# Patient Record
Sex: Male | Born: 1965
Health system: Southern US, Community
[De-identification: ages and names within clinical notes are randomized; demographics above are authoritative.]

## PROBLEM LIST (undated history)

## (undated) DIAGNOSIS — I10 Essential (primary) hypertension: Secondary | ICD-10-CM

## (undated) DIAGNOSIS — N183 Chronic kidney disease, stage 3 (moderate): Secondary | ICD-10-CM

## (undated) DIAGNOSIS — E118 Type 2 diabetes mellitus with unspecified complications: Secondary | ICD-10-CM

## (undated) DIAGNOSIS — H35032 Hypertensive retinopathy, left eye: Secondary | ICD-10-CM

## (undated) DIAGNOSIS — E113593 Type 2 diabetes mellitus with proliferative diabetic retinopathy without macular edema, bilateral: Secondary | ICD-10-CM

## (undated) DIAGNOSIS — E1140 Type 2 diabetes mellitus with diabetic neuropathy, unspecified: Secondary | ICD-10-CM

## (undated) DIAGNOSIS — Z8673 Personal history of transient ischemic attack (TIA), and cerebral infarction without residual deficits: Secondary | ICD-10-CM

## (undated) DIAGNOSIS — E785 Hyperlipidemia, unspecified: Secondary | ICD-10-CM

## (undated) HISTORY — DX: Hypertensive retinopathy, left eye: H35.032

## (undated) HISTORY — DX: Type 2 diabetes mellitus with proliferative diabetic retinopathy without macular edema, bilateral: E11.3593

## (undated) HISTORY — PX: FOOT SURGERY: SHX648

## (undated) HISTORY — DX: Chronic kidney disease, stage 3 (moderate): N18.3

## (undated) HISTORY — DX: Type 2 diabetes mellitus with diabetic neuropathy, unspecified: E11.40

## (undated) HISTORY — DX: Essential (primary) hypertension: I10

## (undated) HISTORY — PX: EYE SURGERY: SHX253

## (undated) HISTORY — DX: Personal history of transient ischemic attack (TIA), and cerebral infarction without residual deficits: Z86.73

## (undated) HISTORY — DX: Type 2 diabetes mellitus with unspecified complications: E11.8

## (undated) HISTORY — DX: Hyperlipidemia, unspecified: E78.5

---

## 2015-08-29 ENCOUNTER — Ambulatory Visit (INDEPENDENT_AMBULATORY_CARE_PROVIDER_SITE_OTHER): Payer: Self-pay | Admitting: Urgent Care

## 2015-08-29 VITALS — BP 170/110 | HR 85 | Temp 98.6°F | Resp 16 | Ht 65.0 in | Wt 186.4 lb

## 2015-08-29 DIAGNOSIS — I10 Essential (primary) hypertension: Secondary | ICD-10-CM

## 2015-08-29 DIAGNOSIS — R200 Anesthesia of skin: Secondary | ICD-10-CM

## 2015-08-29 DIAGNOSIS — H579 Unspecified disorder of eye and adnexa: Secondary | ICD-10-CM

## 2015-08-29 DIAGNOSIS — E1142 Type 2 diabetes mellitus with diabetic polyneuropathy: Secondary | ICD-10-CM

## 2015-08-29 DIAGNOSIS — E1165 Type 2 diabetes mellitus with hyperglycemia: Secondary | ICD-10-CM

## 2015-08-29 DIAGNOSIS — R202 Paresthesia of skin: Secondary | ICD-10-CM

## 2015-08-29 DIAGNOSIS — R208 Other disturbances of skin sensation: Secondary | ICD-10-CM

## 2015-08-29 DIAGNOSIS — IMO0001 Reserved for inherently not codable concepts without codable children: Secondary | ICD-10-CM

## 2015-08-29 LAB — POCT CBC
Granulocyte percent: 59.1 %G (ref 37–80)
HCT, POC: 39.7 % — AB (ref 43.5–53.7)
Hemoglobin: 14.5 g/dL (ref 14.1–18.1)
Lymph, poc: 2.1 (ref 0.6–3.4)
MCH, POC: 30 pg (ref 27–31.2)
MCHC: 36.5 g/dL — AB (ref 31.8–35.4)
MCV: 82.2 fL (ref 80–97)
MID (cbc): 0.7 (ref 0–0.9)
MPV: 7.2 fL (ref 0–99.8)
POC Granulocyte: 4 (ref 2–6.9)
POC LYMPH PERCENT: 30.9 %L (ref 10–50)
POC MID %: 10 %M (ref 0–12)
Platelet Count, POC: 234 10*3/uL (ref 142–424)
RBC: 4.82 M/uL (ref 4.69–6.13)
RDW, POC: 13 %
WBC: 6.7 10*3/uL (ref 4.6–10.2)

## 2015-08-29 LAB — POC MICROSCOPIC URINALYSIS (UMFC): Mucus: ABSENT

## 2015-08-29 LAB — POCT URINALYSIS DIP (MANUAL ENTRY)
BILIRUBIN UA: NEGATIVE
BILIRUBIN UA: NEGATIVE
LEUKOCYTES UA: NEGATIVE
Nitrite, UA: NEGATIVE
Spec Grav, UA: 1.025
Urobilinogen, UA: 0.2
pH, UA: 5

## 2015-08-29 LAB — COMPLETE METABOLIC PANEL WITH GFR
ALBUMIN: 4.1 g/dL (ref 3.6–5.1)
ALK PHOS: 79 U/L (ref 40–115)
ALT: 17 U/L (ref 9–46)
AST: 14 U/L (ref 10–40)
BILIRUBIN TOTAL: 0.9 mg/dL (ref 0.2–1.2)
BUN: 17 mg/dL (ref 7–25)
CALCIUM: 9.4 mg/dL (ref 8.6–10.3)
CO2: 27 mmol/L (ref 20–31)
Chloride: 99 mmol/L (ref 98–110)
Creat: 1.27 mg/dL (ref 0.60–1.35)
GFR, Est African American: 76 mL/min (ref >=60)
GFR, Est Non African American: 66 mL/min (ref >=60)
Glucose, Bld: 177 mg/dL — ABNORMAL HIGH (ref 65–99)
POTASSIUM: 3.5 mmol/L (ref 3.5–5.3)
SODIUM: 138 mmol/L (ref 135–146)
Total Protein: 7.3 g/dL (ref 6.1–8.1)

## 2015-08-29 LAB — GLUCOSE, POCT (MANUAL RESULT ENTRY): POC Glucose: 147 mg/dl — AB (ref 70–99)

## 2015-08-29 LAB — POCT GLYCOSYLATED HEMOGLOBIN (HGB A1C): HEMOGLOBIN A1C: 9.4

## 2015-08-29 MED ORDER — HYDROCHLOROTHIAZIDE 25 MG PO TABS: 25.0000 mg | ORAL_TABLET | Freq: Every day | ORAL | Status: DC

## 2015-08-29 NOTE — Progress Notes (Signed)
MRN: [id number] DOB: 19-Dec-1965  Subjective:   Max Sanchez is a 50 y.o. male presenting for chief complaint of Foot Pain  Reports that he was at the ophthalmologist today because he cannot see out of his left eye. At the ophthalmologist, he was told that he has a retinal bleed, he was scheduled for eye surgery 08/30/2015. His eye doctor also advised him to seek immediate care at Urgent Care or ED for diabetes. Patient states that he has previous diagnosis of HTN but does not take medications for this. Denies dizziness, headache, chest pain, shortness of breath, heart racing, palpitations, nausea, vomiting, abdominal pain, hematuria, lower leg swelling.    Max Sanchez has a current medication list which includes the following prescription(s): glipizide. Also has No Known Allergies.  Max Sanchez  has a past medical history of Diabetes mellitus without complication ([place]). Also  has past surgical history that includes Foot surgery.  Objective:   Vitals: BP 170/110 mmHg  Pulse 85  Temp(Src) 98.6 F (37 C) (Oral)  Resp 16  Ht 5\' 5"  (1.651 m)  Wt 186 lb 6.4 oz (84.55 kg)  BMI 31.02 kg/m2  SpO2 100%  BP Readings from Last 3 Encounters:  08/29/15 170/110   Physical Exam  Constitutional: He is oriented to person, place, and time. He appears well-developed and well-nourished.  HENT:  Mouth/Throat: Oropharynx is clear and moist.  Eyes: No scleral icterus.  Cardiovascular: Normal rate, regular rhythm and intact distal pulses.  Exam reveals no gallop and no friction rub.   No murmur heard. Pulmonary/Chest: No respiratory distress. He has no wheezes. He has no rales.  Abdominal: Soft. Bowel sounds are normal. He exhibits no distension and no mass. There is no tenderness.  Musculoskeletal: He exhibits no edema.  Neurological: He is alert and oriented to person, place, and time.  Skin: Skin is warm and dry.   Results for orders placed or performed in visit on 08/29/15 (from the past 24  hour(s))  POCT CBC     Status: Abnormal   Collection Time: 08/29/15  4:52 PM  Result Value Ref Range   WBC 6.7 4.6 - 10.2 K/uL   Lymph, poc 2.1 0.6 - 3.4   POC LYMPH PERCENT 30.9 10 - 50 %L   MID (cbc) 0.7 0 - 0.9   POC MID % 10.0 0 - 12 %M   POC Granulocyte 4.0 2 - 6.9   Granulocyte percent 59.1 37 - 80 %G   RBC 4.82 4.69 - 6.13 M/uL   Hemoglobin 14.5 14.1 - 18.1 g/dL   HCT, POC 39.7 (A) 43.5 - 53.7 %   MCV 82.2 80 - 97 fL   MCH, POC 30.0 27 - 31.2 pg   MCHC 36.5 (A) 31.8 - 35.4 g/dL   RDW, POC 13.0 %   Platelet Count, POC 234 142 - 424 K/uL   MPV 7.2 0 - 99.8 fL  POCT urinalysis dipstick     Status: Abnormal   Collection Time: 08/29/15  4:52 PM  Result Value Ref Range   Color, UA yellow yellow   Clarity, UA clear clear   Glucose, UA =500 (A) negative   Bilirubin, UA negative negative   Ketones, POC UA negative negative   Spec Grav, UA 1.025    Blood, UA moderate (A) negative   pH, UA 5.0    Protein Ur, POC >=300 (A) negative   Urobilinogen, UA 0.2    Nitrite, UA Negative Negative   Leukocytes, UA Negative Negative  POCT Microscopic Urinalysis ([facility])     Status: Abnormal   Collection Time: 08/29/15  4:52 PM  Result Value Ref Range   WBC,UR,HPF,POC None None WBC/hpf   RBC,UR,HPF,POC Few (A) None RBC/hpf   Bacteria None None, Too numerous to count   Mucus Absent Absent   Epithelial Cells, UR Per Microscopy Few (A) None, Too numerous to count cells/hpf  POCT glycosylated hemoglobin (Hb A1C)     Status: None   Collection Time: 08/29/15  4:52 PM  Result Value Ref Range   Hemoglobin A1C 9.4   POCT glucose (manual entry)     Status: Abnormal   Collection Time: 08/29/15  4:52 PM  Result Value Ref Range   POC Glucose 147 (A) 70 - 99 mg/dl   Assessment and Plan :   1. Uncontrolled type 2 diabetes mellitus without complication, without long-term current use of insulin ([place]) 2. Diabetic polyneuropathy associated with type 2 diabetes mellitus ([place]) 3. Numbness and  tingling 4. Burning sensation of feet - Labs pending. Start glipizide. Labs pending, will add Metformin to his regimen but would like to see liver enzymes first. Counseled on dietary modifications.   5. Essential hypertension - Start HCTZ, counseled on signs and symptoms of end organ damage. His retinal bleeding may very well be due to HTN or uncontrolled diabetes. Counseled patient on better management of these diagnoses.   6. Eye problem - Keep appointment for eye surgery tomorrow.  Jaynee Eagles, PA-C [facility] [phone] 08/29/2015 4:23 PM

## 2015-08-29 NOTE — Patient Instructions (Addendum)
La diabetes mellitus y los alimentos (Diabetes Mellitus and Food) Es importante que controle su nivel de azcar en la sangre (glucosa). El nivel de glucosa en sangre depende en gran medida de lo que usted come. Comer alimentos saludables en las cantidades [place] a lo largo del Training and development officer, aproximadamente a la misma hora [employer], lo ayudar a Chief Technology Officer su nivel de Multimedia programmer. Tambin puede ayudarlo a retrasar o Patent attorney de la diabetes mellitus. Comer de [employer] saludable incluso puede ayudarlo a Chartered loss adjuster de presin arterial y a Science writer o Theatre manager un peso saludable.  Entre las recomendaciones generales para alimentarse y Audiological scientist los alimentos de forma saludable, se incluyen las siguientes:  Respetar las comidas principales y comer colaciones con regularidad. Evitar pasar largos perodos sin comer con el fin de perder peso.  Seguir una dieta que consista principalmente en alimentos de origen vegetal, como frutas, vegetales, frutos secos, legumbres y cereales integrales.  Utilizar mtodos de coccin a baja temperatura, como hornear, en lugar de mtodos de coccin a alta temperatura, como frer en abundante aceite. Trabaje con el nutricionista para aprender a Financial planner nutricional de las etiquetas de los alimentos. CMO PUEDEN AFECTARME LOS ALIMENTOS? Carbohidratos Los carbohidratos afectan el nivel de glucosa en sangre ms que cualquier otro tipo de alimento. El nutricionista lo ayudar a Teacher, adult education cuntos carbohidratos puede consumir en cada comida y ensearle a contarlos. El recuento de carbohidratos es importante para mantener la glucosa en sangre en un nivel saludable, en especial si utiliza insulina o toma determinados medicamentos para la diabetes mellitus. Alcohol El alcohol puede provocar disminuciones sbitas de la glucosa en sangre (hipoglucemia), en especial si utiliza insulina o toma determinados medicamentos para la diabetes mellitus. La  hipoglucemia es una afeccin que puede poner en peligro la vida. Los sntomas de la hipoglucemia (somnolencia, mareos y Data processing manager) son similares a los sntomas de haber consumido mucho alcohol.  Si el mdico lo autoriza a beber alcohol, hgalo con moderacin y siga estas pautas:  Las mujeres no deben beber ms de un trago por da, y los hombres no deben beber ms de dos tragos por Training and development officer. Un trago es igual a:  12 onzas (355 ml) de cerveza  5 onzas de vino (150 ml) de vino  1,5onzas (23m) de bebidas espirituosas  No beba con el estmago vaco.  Mantngase hidratado. Beba agua, gaseosas dietticas o t helado sin azcar.  Las gaseosas comunes, los jugos y otros refrescos podran contener muchos carbohidratos y se dCivil Service fast streamer QU ALIMENTOS NO SE RECOMIENDAN? Cuando haga las elecciones de alimentos, es importante que recuerde que todos los alimentos son distintos. Algunos tienen menos nutrientes que otros por porcin, aunque podran tener la misma cantidad de caloras o carbohidratos. Es difcil darle al cuerpo lo que necesita cuando consume alimentos con menos nutrientes. Estos son algunos ejemplos de alimentos que debera evitar ya que contienen muchas caloras y carbohidratos, pero pocos nutrientes:  GPhysicist, medicaltrans (la mayora de los alimentos procesados incluyen grasas trans en la etiqueta de Informacin nutricional).  Gaseosas comunes.  Jugos.  Caramelos.  Dulces, como tortas, pasteles, rosquillas y g[place]  Comidas fritas. QU ALIMENTOS PUEDO COMER? Consuma alimentos ricos en nutrientes, que nutrirn el cuerpo y lo mantendrn saludable. Los alimentos que debe comer tambin dependern de varios factores, como:  Las caloras que necesita.  Los medicamentos que toma.  Su peso.  El nivel de glucosa en sMarist College  El nArrow Rockde presin arterial.  El nivel de colesterol.  Debe consumir una amplia variedad de alimentos, por ejemplo:  Protenas.  Cortes de [employer].  Protenas con bajo contenido de grasas saturadas, como pescado, clara de huevo y frijoles. Evite las carnes procesadas.  Frutas y vegetales.  Frutas y Photographer que pueden ayudar a Chief Technology Officer los niveles sanguneos de [place], como [place], mangos y batatas.  Productos lcteos.  Elija productos lcteos sin grasa o con bajo contenido de [place], como [place], yogur y [place].  Cereales, panes, pastas y arroz.  Elija cereales integrales, como panes multicereales, avena en grano y arroz integral. Estos alimentos pueden ayudar a controlar la presin arterial.  Daphene Jaeger.  Alimentos que contengan grasas saludables, como frutos secos, Musician, aceite de [place], aceite de canola y pescado. TODOS LOS QUE PADECEN DIABETES MELLITUS TIENEN EL [place] PLAN DE [place]? Dado que todas las personas que padecen diabetes mellitus son distintas, no hay un solo plan de comidas que funcione para todos. Es muy importante que se rena con un nutricionista que lo ayudar a crear un plan de comidas adecuado para usted.   Esta informacin no tiene Marine scientist el consejo del mdico. Asegrese de hacerle al mdico cualquier pregunta que tenga.   Document Released: 05/27/2007 Document Revised: 03/10/2014 Elsevier Interactive Patient Education 2016 [employer].    Hipertensin (Hypertension) La hipertensin, conocida comnmente como presin arterial alta, se produce cuando la sangre bombea en las arterias con mucha fuerza. Las arterias son los vasos sanguneos que transportan la sangre desde el corazn hacia todas las partes del cuerpo. Una lectura de la presin arterial consiste en un nmero ms alto sobre un nmero ms bajo, por ejemplo, 110/72. El nmero ms alto (presin sistlica) corresponde a la presin interna de las arterias cuando el corazn [place]. El nmero ms bajo (presin diastlica) corresponde a la presin interna de las arterias cuando el corazn se relaja. En condiciones ideales,  la presin arterial debe ser inferior a 120/80. La hipertensin fuerza al corazn a trabajar ms para Herbalist. Las arterias pueden estrecharse o ponerse rgidas. La hipertensin no tratada o no controlada puede causar infarto de miocardio, ictus, enfermedad renal y otros problemas. [place] de riesgo de hipertensin son controlables, pero otros no lo son.  Entre los factores de riesgo que usted no puede Chief Technology Officer, se [employer] siguientes:   La raza. El riesgo es mayor para las Retail banker.  La edad. Los riesgos aumentan con la edad.  El sexo. Antes de los 45aos, los hombres corren ms [employer]. Despus de los 65aos, las mujeres corren ms [employer]. Entre los factores de riesgo que usted puede Chief Technology Officer, se [employer] siguientes:  No hacer la cantidad suficiente de actividad fsica o ejercicio.  Tener sobrepeso.  Consumir mucha grasa, azcar, caloras o sal en la dieta.  Beber alcohol en exceso. SIGNOS Y SNTOMAS Por lo general, la hipertensin no causa signos o sntomas. La hipertensin arterial demasiado alta (crisis hipertensiva) puede causar dolor de cabeza, ansiedad, falta de aire y hemorragia nasal. DIAGNSTICO Para detectar si usted tiene hipertensin, el mdico le medir la presin arterial mientras est sentado, con el brazo levantado a la altura del corazn. Debe medirla al [facility] veces en el mismo brazo. Determinadas condiciones pueden causar una diferencia de presin arterial entre el brazo izquierdo y Insurance underwriter. El hecho de tener una sola lectura de la presin arterial ms alta que lo normal no significa que Stage manager. Si no  est claro si tiene hipertensin arterial, es posible que se le pida que regrese otro da para volver a controlarle la presin arterial. O bien se le puede pedir que se controle la presin arterial en su casa durante 1 o ms meses. [place] hipertensin arterial incluye hacer cambios en el estilo de vida y, posiblemente, tomar medicamentos. Un estilo de vida saludable puede ayudar a bajar la presin arterial alta. Quiz deba cambiar algunos hbitos. Los cambios en el estilo de vida pueden incluir lo siguiente:  Seguir la dieta DASH. Esta dieta tiene un alto contenido de frutas, verduras y Psychologist, prison and probation services. Incluye poca cantidad de sal, carnes rojas y azcares agregados.  Mantenga el consumo de sodio por debajo de 2300 mg por da.  Realizar al [employer] 30 y 73 minutos de ejercicio [place], 4 veces por semana como mnimo.  Perder peso, si es necesario.  No fumar.  Limitar el consumo de bebidas alcohlicas.  Aprender formas de reducir el estrs. El mdico puede recetarle medicamentos si los cambios en el estilo de vida no son suficientes para Child psychotherapist la presin arterial y si una de las siguientes afirmaciones es verdadera:  Fidel Levy 64 y 61 aos y su presin arterial sistlica est por encima de 140.  Tiene 60 aos o ms y su presin arterial sistlica est por encima de 150.  Su presin arterial diastlica est por encima de 90.  Tiene diabetes y su presin arterial sistlica est por encima de 140 o su presin arterial diastlica est por encima de 90.  Tiene una enfermedad renal y su presin arterial est por encima de 140/90.  Tiene una enfermedad cardaca y su presin arterial est por encima de 140/90. La presin arterial deseada puede variar en funcin de las enfermedades, la edad y otros factores personales. INSTRUCCIONES PARA EL CUIDADO EN EL HOGAR  Haga que le midan de nuevo la presin arterial segn las indicaciones del [place] los medicamentos solamente como se lo haya indicado el mdico. Siga cuidadosamente las indicaciones. Los medicamentos para la presin arterial deben tomarse segn las indicaciones. Los medicamentos pierden eficacia al omitir las dosis. El hecho  de omitir las dosis tambin [place] el riesgo de otros problemas.  No fume.  Contrlese la presin arterial en su casa segn las indicaciones del mdico. SOLICITE ATENCIN MDICA SI:   Piensa que tiene una reaccin alrgica a los medicamentos.  Tiene mareos o dolores de cabeza con Scientist, research (physical sciences).  Tiene hinchazn en los tobillos.  Tiene problemas de visin. SOLICITE ATENCIN MDICA DE INMEDIATO SI:  Siente un dolor de cabeza intenso o confusin.  Siente debilidad inusual, adormecimiento o que Geneticist, molecular.  Siente dolor intenso en el pecho o en el abdomen.  Vomita repetidas veces.  Tiene dificultad para respirar. ASEGRESE DE QUE:   Comprende estas instrucciones.  Controlar su afeccin.  Recibir ayuda de inmediato si no mejora o si empeora.   Esta informacin no tiene Marine scientist el consejo del mdico. Asegrese de hacerle al mdico cualquier pregunta que tenga.   Document Released: 02/17/2005 Document Revised: 07/04/2014 Elsevier Interactive Patient Education [employer].     IF you received an x-ray today, you will receive an invoice from [facility]. Please contact [facility] at [phone] with questions or concerns regarding your invoice.   IF you received labwork today, you will receive an invoice from [employer]. Please contact [facility] at [phone] with questions or concerns regarding your  invoice.   Our billing staff will not be able to assist you with questions regarding bills from these companies.  You will be contacted with the lab results as soon as they are available. The fastest way to get your results is to activate your My Chart account. Instructions are located on the last page of this paperwork. If you have not heard from [place] regarding the results in 2 weeks, please contact this office.

## 2015-09-07 ENCOUNTER — Other Ambulatory Visit: Payer: Self-pay | Admitting: Urgent Care

## 2015-09-07 DIAGNOSIS — I10 Essential (primary) hypertension: Secondary | ICD-10-CM

## 2015-09-07 DIAGNOSIS — IMO0001 Reserved for inherently not codable concepts without codable children: Secondary | ICD-10-CM

## 2015-09-07 DIAGNOSIS — E1165 Type 2 diabetes mellitus with hyperglycemia: Principal | ICD-10-CM

## 2015-09-07 MED ORDER — LISINOPRIL 10 MG PO TABS: 10.0000 mg | ORAL_TABLET | Freq: Every day | ORAL | Status: DC

## 2015-09-07 MED ORDER — METFORMIN HCL 1000 MG PO TABS: 1000.0000 mg | ORAL_TABLET | Freq: Two times a day (BID) | ORAL | Status: DC

## 2016-05-20 ENCOUNTER — Other Ambulatory Visit: Payer: Self-pay | Admitting: Urgent Care

## 2016-05-21 ENCOUNTER — Other Ambulatory Visit: Payer: Self-pay | Admitting: Urgent Care

## 2016-06-06 ENCOUNTER — Other Ambulatory Visit: Payer: Self-pay | Admitting: Urgent Care

## 2016-06-16 DIAGNOSIS — H35032 Hypertensive retinopathy, left eye: Secondary | ICD-10-CM | POA: Insufficient documentation

## 2016-06-16 DIAGNOSIS — E113593 Type 2 diabetes mellitus with proliferative diabetic retinopathy without macular edema, bilateral: Secondary | ICD-10-CM | POA: Insufficient documentation

## 2016-10-01 HISTORY — PX: OTHER SURGICAL HISTORY: SHX169

## 2016-10-02 DIAGNOSIS — H3342 Traction detachment of retina, left eye: Secondary | ICD-10-CM | POA: Insufficient documentation

## 2016-12-03 HISTORY — PX: OTHER SURGICAL HISTORY: SHX169

## 2016-12-04 DIAGNOSIS — H3341 Traction detachment of retina, right eye: Secondary | ICD-10-CM | POA: Insufficient documentation

## 2017-04-01 HISTORY — PX: OTHER SURGICAL HISTORY: SHX169

## 2018-03-03 DIAGNOSIS — I639 Cerebral infarction, unspecified: Secondary | ICD-10-CM

## 2018-03-03 HISTORY — DX: Cerebral infarction, unspecified: I63.9

## 2018-03-10 ENCOUNTER — Encounter (HOSPITAL_COMMUNITY): Payer: Self-pay

## 2018-03-10 ENCOUNTER — Other Ambulatory Visit (HOSPITAL_COMMUNITY): Payer: Self-pay

## 2018-03-10 ENCOUNTER — Inpatient Hospital Stay (HOSPITAL_COMMUNITY): Payer: Self-pay

## 2018-03-10 ENCOUNTER — Emergency Department (HOSPITAL_COMMUNITY): Payer: Self-pay

## 2018-03-10 ENCOUNTER — Encounter (HOSPITAL_COMMUNITY): Payer: Self-pay | Admitting: Emergency Medicine

## 2018-03-10 ENCOUNTER — Other Ambulatory Visit: Payer: Self-pay

## 2018-03-10 ENCOUNTER — Inpatient Hospital Stay (HOSPITAL_COMMUNITY)
Admission: EM | Admit: 2018-03-10 | Discharge: 2018-03-12 | DRG: 064 | Disposition: A | Discharge status: Home / Self Care | Payer: Self-pay | Attending: Internal Medicine | Admitting: Internal Medicine

## 2018-03-10 DIAGNOSIS — I10 Essential (primary) hypertension: Secondary | ICD-10-CM

## 2018-03-10 DIAGNOSIS — I129 Hypertensive chronic kidney disease with stage 1 through stage 4 chronic kidney disease, or unspecified chronic kidney disease: Secondary | ICD-10-CM | POA: Diagnosis present

## 2018-03-10 DIAGNOSIS — Z79899 Other long term (current) drug therapy: Secondary | ICD-10-CM

## 2018-03-10 DIAGNOSIS — G9341 Metabolic encephalopathy: Secondary | ICD-10-CM | POA: Diagnosis present

## 2018-03-10 DIAGNOSIS — E1165 Type 2 diabetes mellitus with hyperglycemia: Secondary | ICD-10-CM | POA: Diagnosis present

## 2018-03-10 DIAGNOSIS — Z8673 Personal history of transient ischemic attack (TIA), and cerebral infarction without residual deficits: Secondary | ICD-10-CM

## 2018-03-10 DIAGNOSIS — G8191 Hemiplegia, unspecified affecting right dominant side: Secondary | ICD-10-CM

## 2018-03-10 DIAGNOSIS — R809 Proteinuria, unspecified: Secondary | ICD-10-CM

## 2018-03-10 DIAGNOSIS — R410 Disorientation, unspecified: Secondary | ICD-10-CM

## 2018-03-10 DIAGNOSIS — Z9114 Patient's other noncompliance with medication regimen: Secondary | ICD-10-CM

## 2018-03-10 DIAGNOSIS — Z7902 Long term (current) use of antithrombotics/antiplatelets: Secondary | ICD-10-CM

## 2018-03-10 DIAGNOSIS — Z7982 Long term (current) use of aspirin: Secondary | ICD-10-CM

## 2018-03-10 DIAGNOSIS — I161 Hypertensive emergency: Secondary | ICD-10-CM | POA: Diagnosis present

## 2018-03-10 DIAGNOSIS — G8311 Monoplegia of lower limb affecting right dominant side: Secondary | ICD-10-CM | POA: Diagnosis present

## 2018-03-10 DIAGNOSIS — I639 Cerebral infarction, unspecified: Secondary | ICD-10-CM

## 2018-03-10 DIAGNOSIS — N179 Acute kidney failure, unspecified: Secondary | ICD-10-CM

## 2018-03-10 DIAGNOSIS — E1129 Type 2 diabetes mellitus with other diabetic kidney complication: Secondary | ICD-10-CM

## 2018-03-10 DIAGNOSIS — R600 Localized edema: Secondary | ICD-10-CM | POA: Diagnosis present

## 2018-03-10 DIAGNOSIS — E1122 Type 2 diabetes mellitus with diabetic chronic kidney disease: Secondary | ICD-10-CM | POA: Diagnosis present

## 2018-03-10 DIAGNOSIS — R4701 Aphasia: Secondary | ICD-10-CM | POA: Diagnosis present

## 2018-03-10 DIAGNOSIS — Z7984 Long term (current) use of oral hypoglycemic drugs: Secondary | ICD-10-CM

## 2018-03-10 DIAGNOSIS — Z87891 Personal history of nicotine dependence: Secondary | ICD-10-CM

## 2018-03-10 DIAGNOSIS — Z6829 Body mass index (BMI) 29.0-29.9, adult: Secondary | ICD-10-CM

## 2018-03-10 DIAGNOSIS — E669 Obesity, unspecified: Secondary | ICD-10-CM | POA: Diagnosis present

## 2018-03-10 DIAGNOSIS — N189 Chronic kidney disease, unspecified: Secondary | ICD-10-CM

## 2018-03-10 DIAGNOSIS — R9431 Abnormal electrocardiogram [ECG] [EKG]: Secondary | ICD-10-CM | POA: Diagnosis present

## 2018-03-10 DIAGNOSIS — E11319 Type 2 diabetes mellitus with unspecified diabetic retinopathy without macular edema: Secondary | ICD-10-CM | POA: Diagnosis present

## 2018-03-10 DIAGNOSIS — IMO0001 Reserved for inherently not codable concepts without codable children: Secondary | ICD-10-CM

## 2018-03-10 DIAGNOSIS — N183 Chronic kidney disease, stage 3 unspecified: Secondary | ICD-10-CM

## 2018-03-10 DIAGNOSIS — E1151 Type 2 diabetes mellitus with diabetic peripheral angiopathy without gangrene: Secondary | ICD-10-CM | POA: Diagnosis present

## 2018-03-10 DIAGNOSIS — N186 End stage renal disease: Secondary | ICD-10-CM

## 2018-03-10 DIAGNOSIS — R319 Hematuria, unspecified: Secondary | ICD-10-CM | POA: Diagnosis present

## 2018-03-10 DIAGNOSIS — E785 Hyperlipidemia, unspecified: Secondary | ICD-10-CM | POA: Diagnosis present

## 2018-03-10 DIAGNOSIS — I6389 Other cerebral infarction: Secondary | ICD-10-CM

## 2018-03-10 DIAGNOSIS — R4182 Altered mental status, unspecified: Secondary | ICD-10-CM

## 2018-03-10 DIAGNOSIS — I6310 Cerebral infarction due to embolism of unspecified precerebral artery: Principal | ICD-10-CM | POA: Diagnosis present

## 2018-03-10 DIAGNOSIS — Z9119 Patient's noncompliance with other medical treatment and regimen: Secondary | ICD-10-CM

## 2018-03-10 DIAGNOSIS — R29700 NIHSS score 0: Secondary | ICD-10-CM | POA: Diagnosis present

## 2018-03-10 HISTORY — DX: Personal history of transient ischemic attack (TIA), and cerebral infarction without residual deficits: Z86.73

## 2018-03-10 HISTORY — DX: End stage renal disease: N18.6

## 2018-03-10 HISTORY — DX: Essential (primary) hypertension: I10

## 2018-03-10 LAB — I-STAT CHEM 8, ED
BUN: 34 mg/dL — ABNORMAL HIGH (ref 6–20)
Calcium, Ion: 1.08 mmol/L — ABNORMAL LOW (ref 1.15–1.40)
Chloride: 103 mmol/L (ref 98–111)
Creatinine, Ser: 3.9 mg/dL — ABNORMAL HIGH (ref 0.61–1.24)
Glucose, Bld: 193 mg/dL — ABNORMAL HIGH (ref 70–99)
HEMATOCRIT: 33 % — AB (ref 39.0–52.0)
Hemoglobin: 11.2 g/dL — ABNORMAL LOW (ref 13.0–17.0)
Potassium: 3.5 mmol/L (ref 3.5–5.1)
SODIUM: 134 mmol/L — AB (ref 135–145)
TCO2: 22 mmol/L (ref 22–32)

## 2018-03-10 LAB — COMPREHENSIVE METABOLIC PANEL
ALT: 16 U/L (ref 0–44)
AST: 16 U/L (ref 15–41)
Albumin: 2.5 g/dL — ABNORMAL LOW (ref 3.5–5.0)
Alkaline Phosphatase: 69 U/L (ref 38–126)
Anion gap: 12 (ref 5–15)
BUN: 37 mg/dL — ABNORMAL HIGH (ref 6–20)
CO2: 19 mmol/L — ABNORMAL LOW (ref 22–32)
Calcium: 8.4 mg/dL — ABNORMAL LOW (ref 8.9–10.3)
Chloride: 102 mmol/L (ref 98–111)
Creatinine, Ser: 3.87 mg/dL — ABNORMAL HIGH (ref 0.61–1.24)
GFR calc Af Amer: 20 mL/min — ABNORMAL LOW (ref >60)
GFR, EST NON AFRICAN AMERICAN: 17 mL/min — AB (ref >60)
Glucose, Bld: 193 mg/dL — ABNORMAL HIGH (ref 70–99)
Potassium: 3.4 mmol/L — ABNORMAL LOW (ref 3.5–5.1)
Sodium: 133 mmol/L — ABNORMAL LOW (ref 135–145)
TOTAL PROTEIN: 6.3 g/dL — AB (ref 6.5–8.1)
Total Bilirubin: 0.9 mg/dL (ref 0.3–1.2)

## 2018-03-10 LAB — CBG MONITORING, ED
GLUCOSE-CAPILLARY: 165 mg/dL — AB (ref 70–99)
Glucose-Capillary: 188 mg/dL — ABNORMAL HIGH (ref 70–99)
Glucose-Capillary: 69 mg/dL — ABNORMAL LOW (ref 70–99)
Glucose-Capillary: 161 mg/dL — ABNORMAL HIGH (ref 70–99)

## 2018-03-10 LAB — URINALYSIS, ROUTINE W REFLEX MICROSCOPIC
Bilirubin Urine: NEGATIVE
Glucose, UA: >=500 mg/dL — AB
Ketones, ur: 5 mg/dL — AB
Leukocytes, UA: NEGATIVE
Nitrite: NEGATIVE
Specific Gravity, Urine: 1.014 (ref 1.005–1.030)
pH: 5 (ref 5.0–8.0)

## 2018-03-10 LAB — DIFFERENTIAL
Abs Immature Granulocytes: 0.03 10*3/uL (ref 0.00–0.07)
BASOS PCT: 1 %
Basophils Absolute: 0.1 10*3/uL (ref 0.0–0.1)
Eosinophils Absolute: 0.1 10*3/uL (ref 0.0–0.5)
Eosinophils Relative: 1 %
Immature Granulocytes: 0 %
Lymphocytes Relative: 13 %
Lymphs Abs: 1.3 10*3/uL (ref 0.7–4.0)
MONOS PCT: 5 %
Monocytes Absolute: 0.5 10*3/uL (ref 0.1–1.0)
NEUTROS PCT: 80 %
Neutro Abs: 8.2 10*3/uL — ABNORMAL HIGH (ref 1.7–7.7)

## 2018-03-10 LAB — RAPID URINE DRUG SCREEN, HOSP PERFORMED
Amphetamines: NOT DETECTED
Barbiturates: NOT DETECTED
Benzodiazepines: NOT DETECTED
COCAINE: NOT DETECTED
Opiates: NOT DETECTED
Tetrahydrocannabinol: NOT DETECTED

## 2018-03-10 LAB — CBC
HCT: 35.1 % — ABNORMAL LOW (ref 39.0–52.0)
Hemoglobin: 11.4 g/dL — ABNORMAL LOW (ref 13.0–17.0)
MCH: 26.8 pg (ref 26.0–34.0)
MCHC: 32.5 g/dL (ref 30.0–36.0)
MCV: 82.4 fL (ref 80.0–100.0)
Platelets: 265 10*3/uL (ref 150–400)
RBC: 4.26 MIL/uL (ref 4.22–5.81)
RDW: 12.8 % (ref 11.5–15.5)
WBC: 10.2 10*3/uL (ref 4.0–10.5)
nRBC: 0 % (ref 0.0–0.2)

## 2018-03-10 LAB — ECHOCARDIOGRAM COMPLETE

## 2018-03-10 LAB — ETHANOL: Alcohol, Ethyl (B): <10 mg/dL (ref <10)

## 2018-03-10 LAB — LIPID PANEL
Cholesterol: 359 mg/dL — ABNORMAL HIGH (ref 0–200)
HDL: 42 mg/dL (ref >40)
LDL Cholesterol: 255 mg/dL — ABNORMAL HIGH (ref 0–99)
Total CHOL/HDL Ratio: 8.5 RATIO
Triglycerides: 312 mg/dL — ABNORMAL HIGH (ref <150)
VLDL: 62 mg/dL — ABNORMAL HIGH (ref 0–40)

## 2018-03-10 LAB — CREATININE, URINE, RANDOM: CREATININE, URINE: 82.95 mg/dL

## 2018-03-10 LAB — APTT: aPTT: 27 seconds (ref 24–36)

## 2018-03-10 LAB — I-STAT TROPONIN, ED: Troponin i, poc: 0.01 ng/mL (ref 0.00–0.08)

## 2018-03-10 LAB — PROTIME-INR
INR: 1.02
Prothrombin Time: 13.3 seconds (ref 11.4–15.2)

## 2018-03-10 LAB — HEMOGLOBIN A1C
Hgb A1c MFr Bld: 7.3 % — ABNORMAL HIGH (ref 4.8–5.6)
Mean Plasma Glucose: 162.81 mg/dL

## 2018-03-10 LAB — GLUCOSE, CAPILLARY: Glucose-Capillary: 145 mg/dL — ABNORMAL HIGH (ref 70–99)

## 2018-03-10 LAB — SODIUM, URINE, RANDOM: Sodium, Ur: 64 mmol/L

## 2018-03-10 MED ORDER — HEPARIN SODIUM (PORCINE) 5000 UNIT/ML IJ SOLN
5000.0000 [IU] | Freq: Three times a day (TID) | INTRAMUSCULAR | Status: DC
  Administered 2018-03-10 – 2018-03-12 (×7): 5000 [IU] via SUBCUTANEOUS
  Filled 2018-03-10 (×7): qty 1

## 2018-03-10 MED ORDER — LABETALOL HCL 5 MG/ML IV SOLN
5.0000 mg | Freq: Once | INTRAVENOUS | Status: AC
  Administered 2018-03-10: 5 mg via INTRAVENOUS
  Filled 2018-03-10: qty 4

## 2018-03-10 MED ORDER — ASPIRIN EC 81 MG PO TBEC
81.0000 mg | DELAYED_RELEASE_TABLET | Freq: Every day | ORAL | Status: DC
  Administered 2018-03-11 – 2018-03-12 (×2): 81 mg via ORAL
  Filled 2018-03-10 (×2): qty 1

## 2018-03-10 MED ORDER — ACETAMINOPHEN 325 MG PO TABS
650.0000 mg | ORAL_TABLET | Freq: Four times a day (QID) | ORAL | Status: DC | PRN
  Administered 2018-03-10: 650 mg via ORAL
  Filled 2018-03-10: qty 2

## 2018-03-10 MED ORDER — ATORVASTATIN CALCIUM 40 MG PO TABS
40.0000 mg | ORAL_TABLET | Freq: Every day | ORAL | Status: DC
  Administered 2018-03-10 – 2018-03-11 (×2): 40 mg via ORAL
  Filled 2018-03-10 (×2): qty 1

## 2018-03-10 MED ORDER — ASPIRIN 325 MG PO TABS
325.0000 mg | ORAL_TABLET | Freq: Every day | ORAL | Status: AC
  Administered 2018-03-10: 325 mg via ORAL
  Filled 2018-03-10: qty 1

## 2018-03-10 MED ORDER — SENNOSIDES-DOCUSATE SODIUM 8.6-50 MG PO TABS: 1.0000 | ORAL_TABLET | Freq: Every evening | ORAL | Status: DC | PRN

## 2018-03-10 MED ORDER — HYDRALAZINE HCL 20 MG/ML IJ SOLN
5.0000 mg | Freq: Once | INTRAMUSCULAR | Status: AC
  Administered 2018-03-10: 5 mg via INTRAVENOUS
  Filled 2018-03-10: qty 1

## 2018-03-10 MED ORDER — ACETAMINOPHEN 650 MG RE SUPP: 650.0000 mg | Freq: Four times a day (QID) | RECTAL | Status: DC | PRN

## 2018-03-10 MED ORDER — ONDANSETRON HCL 4 MG/2ML IJ SOLN
4.0000 mg | INTRAMUSCULAR | Status: AC
  Administered 2018-03-10: 4 mg via INTRAVENOUS
  Filled 2018-03-10: qty 2

## 2018-03-10 MED ORDER — ASPIRIN 325 MG PO TABS: 325.0000 mg | ORAL_TABLET | Freq: Every day | ORAL | Status: DC

## 2018-03-10 MED ORDER — INSULIN ASPART 100 UNIT/ML ~~LOC~~ SOLN
0.0000 [IU] | Freq: Three times a day (TID) | SUBCUTANEOUS | Status: DC
  Administered 2018-03-10: 3 [IU] via SUBCUTANEOUS
  Administered 2018-03-11: 2 [IU] via SUBCUTANEOUS
  Administered 2018-03-11: 3 [IU] via SUBCUTANEOUS
  Administered 2018-03-12: 2 [IU] via SUBCUTANEOUS
  Administered 2018-03-12: 3 [IU] via SUBCUTANEOUS

## 2018-03-10 NOTE — ED Notes (Signed)
Report from [place], [place]. Into reassess patient, he was sleeping, Awoke him and he started vomiting. Neurology at the bedside, verbal order for zofran x 1.

## 2018-03-10 NOTE — ED Notes (Signed)
Patient transported to X-ray 

## 2018-03-10 NOTE — ED Notes (Signed)
Patient transported to MRI 

## 2018-03-10 NOTE — ED Notes (Signed)
Patient given apple Juice for a CBG of 69

## 2018-03-10 NOTE — ED Notes (Signed)
Report attempted, RN to call back for report. 

## 2018-03-10 NOTE — H&P (Addendum)
Date: 03/10/2018               Patient Name:  Max Sanchez MRN: [id number]  DOB: 20-Nov-1965 Age / Sex: 53 y.o., male   PCP: System, Pcp Not In         Medical Service: Internal Medicine Teaching Service         Attending Physician: Dr. Heber Kingston, Rachel Moulds, DO    First Contact: Dr. Koleen Distance Pager: [phone]  Second Contact: Dr. Frederico Hamman Pager: [phone]       After Hours (After 5p/  First Contact Pager: [phone]  weekends / holidays): Second Contact Pager: [phone]   Chief Complaint: confusion  History of Present Illness: Max Sanchez is a 53 year old male with past medical history of type 2 diabetes with diabetic retinopathy, CKD stage III, and hypertension  that is brought in by family to the ED due to acute change in mental status this morning. Patient was seen walking around disoriented and confused.  When patient was evaluated he was oriented 3. He told me that yesterday he developed right-sided upper and lower extremity numbness and weakness. He states this resolved but continues to have some right lower extremity weakness. He states that he remembers feeling confused and could not recall his family members names this morning  He has associated symptoms of nausea/vomiting x 1 episode in the ED.  He reports a history of diabetes and hypertension and has not been taking Medications for his medical conditions for the past 6 months. He states that he is taking pills that are sent from [place] for these conditions but cannot tell me what they are.  He reports occasional use of BC powder for headaches. He currently denies headache, shortness of breath, chest pain or leg swelling.   Meds: HCTZ 25mg  daily, Spironolactone 50mg  BID, lisinopril 20mg    No outpatient medications have been marked as taking for the 03/10/18 encounter [facility] Encounter).     Allergies: Allergies as of 03/10/2018  . (No Known Allergies)   Past Medical History:  Diagnosis Date  . Diabetes mellitus without  complication ([place])     Family History: Father: diabetes Brother: diabetes  Social History: tobacco use: denies current use. Has a 30-pack-year smoking history Alcohol use: Denies Illicit drug use: Denies  Review of Systems: A complete ROS was negative except as per HPI.   Physical Exam: Blood pressure 122/77, pulse (!) 104, temperature (!) 97.5 F (36.4 C), temperature source Oral, resp. rate 15, SpO2 99 %. Physical Exam  Constitutional: He is oriented to person, place, and time and well-developed, well-nourished, and in no distress.  HENT:  Head: Normocephalic and atraumatic.  Eyes: Pupils are equal, round, and reactive to light. Conjunctivae are normal.  Neck: Normal range of motion. Neck supple.  Cardiovascular: Normal heart sounds. Exam reveals no gallop and no friction rub.  No murmur heard. tachycardic  Pulmonary/Chest: Effort normal and breath sounds normal. No respiratory distress. He has no wheezes. He has no rales.  Abdominal: Soft. Bowel sounds are normal. He exhibits no distension. There is no abdominal tenderness. There is no rebound and no guarding.  Musculoskeletal:        General: No edema.  Neurological: He is alert and oriented to person, place, and time. No cranial nerve deficit. Coordination normal.  Finger to nose test normal 5/5 strength in upper extremities bilaterally 5/5 strength in left lower extremity 3/5 strength in right lower extremity  Skin: Skin is warm and dry.  Psychiatric:  Mood, memory, affect and judgment normal.    EKG: personally reviewed my interpretation is sinus rhythm without ischemic changes  CXR: personally reviewed my interpretation is no acute cardio/pulmonary process  Assessment & Plan by Problem: Active Problems:   Confusion and disorientation   Hypertension   CKD (chronic kidney disease), stage III (HCC)   Acute-on-chronic kidney injury (HCC)   Proteinuria   Hematuria   Prolonged Q-T interval on ECG  Confusion and  disorientation Symptoms have improved since this morning. He was not confused or disoriented in the ED.  Blood pressure was elevated to 221/113 on arrival .  He has not been taking anti-hypertensive medication for the past 6 months. ED was concerned for hypertensive emergency with elevated blood pressure, confusion and AKI. He received one dose of hydralazine by the ED provider and blood pressure is 122/77.   Patient reports stroke like symptoms the day prior to admission.  CT head without acute intracranial abnormalities.  Will obtain MRI for assessment of CVA. At this time will hold further antihypertensive medications until MRI complete due to concern for stroke. -f/u MRI brain -vitals every 2hrs -mNIHSS Q2hr for 12 hrs -holding antihypertensives, monitoring closely  Acute on chronic kidney disease CKD stage III Baseline creatinine is 2.0.  Patient presents with a creatinine of 3.9.  UA without signs of infection.  Patient is noted to have hematuria, unchanged from previous UA in 2017. Patient denies flank pain.  Will need outpatient hematuria workup. Will obtain urine studies and renal ultrasound.   -urine Na, urine creatinine -renal ultrasound  Hypertension Patient has not taken antihypertensives for 6 months.  Patient follows with [facility]Slatk[facility] of [facility].  Patient's blood pressure regimen includes hydrochlorothiazide 25 mg daily, spironolactone 50 mg twice a day and lisinopril 20 mg daily.  His last 3 office visit blood pressure readings were 164/91, 197/109 and 216/117.  He is currently normotensive with 1 dose of IV hydralazine 5 mg in the ED.  Patient's elevated blood pressure readings today prior to IV BP meds is similar to previous office readings.   -monitor  Type 2 diabetes mellitus Proteinuria   Patient not currently taking glucose lowering medications.  Last hemoglobin A1c was 9.86 in March 2019. Serum glucose 193.   -SSI -Hgb A1C  Diabetic retinopathy Follows  with ophthalmology  Dispo: Admit patient to Inpatient with expected length of stay greater than 2 midnights.  Signed: Valinda Party, DO 03/10/2018, 9:41 AM  Pager: [phone]

## 2018-03-10 NOTE — ED Notes (Signed)
IM paged for patient vomiting and BP notification

## 2018-03-10 NOTE — ED Provider Notes (Addendum)
Medical screening examination/treatment/procedure(s) were conducted as a shared visit with non-physician practitioner(s) and myself.  I personally evaluated the patient during the encounter.  EKG Interpretation  Date/Time:  Wednesday March 10 2018 05:53:43 EST Ventricular Rate:  98 PR Interval:  120 QRS Duration: 90 QT Interval:  394 QTC Calculation: 503 R Axis:   71 Text Interpretation:  Normal sinus rhythm Nonspecific ST abnormality Prolonged QT Abnormal ECG No old tracing to compare Confirmed by Pryor Curia [phone]) on 03/10/2018 6:12:53 AM Also confirmed by Ward, Cyril Mourning [phone]), editor Philomena Doheny [phone])  on 03/10/2018 7:04:48 AM    Patient is a 53 year old male with unknown past medical history who presents to the emergency department with confusion.  Spanish interpreter used for patient's visit.  He presents with family.  They report he went to bed at 11 PM last night was acting normally.  They woke up this morning and he was wandering around the house acting confused.  Currently patient is oriented x3.  He has 5/5 strength in all 4 extremities.  Sensation to light touch intact diffusely.  Reports chronic numbness in bilateral feet.  No vision changes.  No visual field deficits.  No pronator drift.  Cranial nerves II through XII intact.  Normal speech.  Complaining of mild headache, mild dizziness, "heartburn".  Work-up initiated including labs, urine, head CT.  Does not meet criteria for code stroke.  CT head shows no acute abnormality.  He is hypertensive here.  Labs show acute renal failure.  Uremia may be the cause of his encephalopathy.  Renal failure may be secondary to hypertension.  Will admit for hypertensive emergency, acute renal failure, confusion.      Ward, Delice Bison, DO 03/10/18 [phone]

## 2018-03-10 NOTE — ED Notes (Signed)
Pt returned from MRI °

## 2018-03-10 NOTE — ED Provider Notes (Addendum)
[facility] EMERGENCY DEPARTMENT Provider Note   CSN: [id number] Arrival date & time: 03/10/18  0548     History   Chief Complaint Chief Complaint  Patient presents with  . Altered Mental Status    HPI Max Sanchez is a 53 y.o. male.  Max Sanchez is a 53 y.o. male diabetes and hypertension, who presents to the emergency department for evaluation accompanied by family after he woke up confused and disoriented.  Last known well time of 11 PM when he went to bed.  He woke this morning family found him wandering the house seemingly confused.  Patient is Spanish-speaking, wife and both children are at the bedside, and children speak [place] fluently.  Patient complaining of a mild headache some mild dizziness and "heartburn".  Patient denies any chest pain or shortness of breath.  Denies any vision changes.  Reports chronic numbness in bilateral feet no other focal neurologic symptoms.  Patient's children report he has a history of high blood pressure and his doctor at [facility] wanted to put him on multiple blood pressure medications but they report these made him feel "down and tired" so he stopped taking them and began taking the medications that he was given in [place], they were unsure what these medications are or what they are for.  Per chart review patient is followed at [facility] most recent evaluation appears to be in April 2019.  Baseline creatinine appears to be between 1.6 and 2.     Past Medical History:  Diagnosis Date  . Diabetes mellitus without complication ([place])     There are no active problems to display for this patient.   Past Surgical History:  Procedure Laterality Date  . FOOT SURGERY          Home Medications    Prior to Admission medications   Medication Sig Start Date End Date Taking? Authorizing Provider  hydrochlorothiazide (HYDRODIURIL) 25 MG tablet TAKE ONE TABLET BY MOUTH ONCE DAILY Patient not taking: Reported on 03/10/2018 06/06/16    Jaynee Eagles, PA-C  lisinopril (PRINIVIL,ZESTRIL) 10 MG tablet Take 1 tablet (10 mg total) by mouth daily. Patient not taking: Reported on 03/10/2018 09/07/15   Jaynee Eagles, PA-C  metFORMIN (GLUCOPHAGE) 1000 MG tablet Take 1 tablet (1,000 mg total) by mouth 2 (two) times daily with a meal. Patient not taking: Reported on 03/10/2018 09/07/15   Jaynee Eagles, PA-C    Family History No family history on file.  Social History Social History   Tobacco Use  . Smoking status: Former Smoker  Substance Use Topics  . Alcohol use: Not on file  . Drug use: Not on file     Allergies   Patient has no known allergies.   Review of Systems Review of Systems  Constitutional: Negative for chills and fever.  HENT: Negative.   Eyes: Negative for visual disturbance.  Respiratory: Negative for cough and shortness of breath.   Cardiovascular: Negative for chest pain.  Gastrointestinal: Negative for abdominal pain, nausea and vomiting.  Musculoskeletal: Negative for arthralgias, myalgias and neck pain.  Skin: Negative for color change and rash.  Neurological: Positive for dizziness and headaches. Negative for seizures, facial asymmetry, weakness, light-headedness and numbness.  Psychiatric/Behavioral: Positive for confusion.  All other systems reviewed and are negative.    Physical Exam Updated Vital Signs BP (!) 226/110   Pulse 100   Temp (!) 97.5 F (36.4 C) (Oral)   Resp (!) 21   SpO2 100%   Physical Exam  Vitals signs and nursing note reviewed.  Constitutional:      General: He is not in acute distress.    Appearance: Normal appearance. He is well-developed. He is not toxic-appearing or diaphoretic.  HENT:     Head: Normocephalic and atraumatic.     Mouth/Throat:     Mouth: Mucous membranes are moist.     Pharynx: Oropharynx is clear.  Eyes:     General:        Right eye: No discharge.        Left eye: No discharge.     Extraocular Movements: Extraocular movements intact.      Conjunctiva/sclera: Conjunctivae normal.     Pupils: Pupils are equal, round, and reactive to light.  Neck:     Musculoskeletal: Neck supple.  Cardiovascular:     Rate and Rhythm: Normal rate and regular rhythm.     Pulses: Normal pulses.     Heart sounds: Normal heart sounds. No murmur. No friction rub. No gallop.   Pulmonary:     Effort: Pulmonary effort is normal. No respiratory distress.     Breath sounds: Normal breath sounds. No wheezing or rales.     Comments: Respirations equal and unlabored, patient able to speak in full sentences, lungs clear to auscultation bilaterally Abdominal:     General: Abdomen is flat. Bowel sounds are normal. There is no distension.     Palpations: Abdomen is soft. There is no mass.     Tenderness: There is no abdominal tenderness. There is no guarding.     Comments: Abdomen soft, nondistended, nontender to palpation in all quadrants without guarding or peritoneal signs  Musculoskeletal:        General: No deformity.     Right lower leg: No edema.     Left lower leg: No edema.  Skin:    General: Skin is warm and dry.     Capillary Refill: Capillary refill takes less than 2 seconds.  Neurological:     General: No focal deficit present.     Mental Status: He is alert and oriented to person, place, and time.     Coordination: Coordination normal.     Comments: Speech is clear, able to follow commands CN III-XII intact Normal strength in upper and lower extremities bilaterally including dorsiflexion and plantar flexion, strong and equal grip strength Sensation normal to light and sharp touch Moves extremities without ataxia, coordination intact Normal finger to nose and rapid alternating movements No pronator drift  Psychiatric:        Mood and Affect: Mood normal.        Behavior: Behavior normal.      ED Treatments / Results  Labs (all labs ordered are listed, but only abnormal results are displayed) Labs Reviewed  CBC - Abnormal;  Notable for the following components:      Result Value   Hemoglobin 11.4 (*)    HCT 35.1 (*)    All other components within normal limits  DIFFERENTIAL - Abnormal; Notable for the following components:   Neutro Abs 8.2 (*)    All other components within normal limits  COMPREHENSIVE METABOLIC PANEL - Abnormal; Notable for the following components:   Sodium 133 (*)    Potassium 3.4 (*)    CO2 19 (*)    Glucose, Bld 193 (*)    BUN 37 (*)    Creatinine, Ser 3.87 (*)    Calcium 8.4 (*)    Total Protein 6.3 (*)  Albumin 2.5 (*)    GFR calc non Af Amer 17 (*)    GFR calc Af Amer 20 (*)    All other components within normal limits  CBG MONITORING, ED - Abnormal; Notable for the following components:   Glucose-Capillary 188 (*)    All other components within normal limits  I-STAT CHEM 8, ED - Abnormal; Notable for the following components:   Sodium 134 (*)    BUN 34 (*)    Creatinine, Ser 3.90 (*)    Glucose, Bld 193 (*)    Calcium, Ion 1.08 (*)    Hemoglobin 11.2 (*)    HCT 33.0 (*)    All other components within normal limits  PROTIME-INR  APTT  ETHANOL  RAPID URINE DRUG SCREEN, HOSP PERFORMED  URINALYSIS, ROUTINE W REFLEX MICROSCOPIC  I-STAT TROPONIN, ED  I-STAT CHEM 8, ED  I-STAT TROPONIN, ED    EKG EKG Interpretation  Date/Time:  Wednesday March 10 2018 05:53:43 EST Ventricular Rate:  98 PR Interval:  120 QRS Duration: 90 QT Interval:  394 QTC Calculation: 503 R Axis:   71 Text Interpretation:  Normal sinus rhythm Nonspecific ST abnormality Prolonged QT Abnormal ECG No old tracing to compare Confirmed by Pryor Curia [phone]) on 03/10/2018 6:12:53 AM Also confirmed by Ward, Cyril Mourning [phone]), editor Philomena Doheny [phone])  on 03/10/2018 7:04:48 AM   Radiology Dg Chest 2 View  Result Date: 03/10/2018 CLINICAL DATA:  Altered mental status with cough and vomiting EXAM: CHEST - 2 VIEW COMPARISON:  None. FINDINGS: Lungs are clear. Heart is borderline enlarged with  pulmonary vascularity normal. No adenopathy. No bone lesions. IMPRESSION: Borderline cardiac enlargement.  No edema or consolidation. Electronically Signed   By: Lowella Grip III M.D.   On: 03/10/2018 07:03   Ct Head Wo Contrast  Result Date: 03/10/2018 CLINICAL DATA:  Initial evaluation for acute altered mental status, confusion. EXAM: CT HEAD WITHOUT CONTRAST TECHNIQUE: Contiguous axial images were obtained from the base of the skull through the vertex without intravenous contrast. COMPARISON:  None available. FINDINGS: Brain: Generalized age-related cerebral atrophy with mild chronic small vessel ischemic disease. No acute intracranial hemorrhage. No acute large vessel territory infarct. No mass lesion, midline shift or mass effect. No hydrocephalus. No extra-axial fluid collection. Vascular: No hyperdense vessel. Skull: Scalp soft tissues and calvarium within normal limits. Sinuses/Orbits: Globes and orbital soft tissues normal. Paranasal sinuses are largely clear. No mastoid effusion. Other: None. IMPRESSION: 1. No acute intracranial abnormality. 2. Generalized age-related cerebral atrophy with mild chronic small vessel ischemic disease. Electronically Signed   By: Jeannine Boga M.D.   On: 03/10/2018 06:29    Procedures .Critical Care Performed by: Jacqlyn Larsen, PA-C Authorized by: Jacqlyn Larsen, PA-C   Critical care provider statement:    Critical care time (minutes):  45   Critical care was necessary to treat or prevent imminent or life-threatening deterioration of the following conditions:  Circulatory failure and renal failure (Hypertensive emergency with acute renal failure)   Critical care was time spent personally by me on the following activities:  Discussions with consultants, evaluation of patient's response to treatment, examination of patient, ordering and performing treatments and interventions, ordering and review of laboratory studies, ordering and review of  radiographic studies, pulse oximetry, re-evaluation of patient's condition, obtaining history from patient or surrogate and review of old charts   I assumed direction of critical care for this patient from another provider in my specialty: no     (including  critical care time)  Medications Ordered in ED Medications  hydrALAZINE (APRESOLINE) injection 5 mg (5 mg Intravenous Given 03/10/18 0708)     Initial Impression / Assessment and Plan / ED Course  I have reviewed the triage vital signs and the nursing notes.  Pertinent labs & imaging results that were available during my care of the patient were reviewed by me and considered in my medical decision making (see chart for details).  53 year old Spanish-speaking male arrives for evaluation of altered mental status.  Last known well time 39 PM reported by family who found him this morning confused and wandering the house.  On arrival patient is alert and oriented x3 with no focal neurologic deficits, NIH stroke scale of 0.  Complaining of mild headache and some intermittent dizziness, denies chest pain or shortness of breath, no abdominal pain.  Afebrile, patient noted to be profoundly hypertensive with blood pressure 226/110 on arrival, vitals otherwise unremarkable.  Lungs clear, abdominal exam is benign, no other focal findings on exam.  CT head, basic labs, troponin, EKG, chest x-ray, urinalysis, UDS, ethanol and coags ordered from triage.  CT head shows no acute findings, EKG with some nonspecific ST changes but no acute ischemic changes.  Initial troponin negative.  No leukocytosis, hemoglobin of 11.4.  Mild hypokalemia and hyponatremia sodium of 133 and potassium of 3.4.  Glucose is somewhat elevated at 193, known history of diabetes.  Creatinine is significantly elevated from baseline at 3.87, most recent was 1.86 in 4/19 per chart review in care everywhere.  BUN slightly elevated at 37, patient could have some element of uremic encephalopathy.   No anion gap.  Coags unremarkable.  Ethanol negative.  Urinalysis and UDS pending.  Chest x-ray shows no evidence of edema or infiltrate, no other active cardiopulmonary disease.  Reason Hayden Pedro concerning for hypertensive emergency with acute kidney injury and transient altered mental status, again there are no focal neurologic deficits on exam here today and head CT without any acute findings.  5 mg of hydralazine given with improvement in blood pressure now 171/95.  Case discussed with Dr. Leonides Schanz who saw and evaluated the patient as well, patient will need to be admitted for hypertensive emergency with AKI.  Case discussed with internal medicine teaching service who will see and admit the patient.  Vitals:   03/10/18 0630 03/10/18 0700 03/10/18 0715 03/10/18 0730  BP: (!) 226/110 (!) 234/115 (!) 171/95 (!) 165/87  Pulse: 100 (!) 105 (!) 104 (!) 105  Resp: (!) 21 (!) 8 19 (!) 21  Temp:      TempSrc:      SpO2: 100% 99% 100% 100%     Final Clinical Impressions(s) / ED Diagnoses   Final diagnoses:  Altered mental status, unspecified altered mental status type  Acute renal failure, unspecified acute renal failure type [facility])  Hypertensive emergency    ED Discharge Orders    None       Jacqlyn Larsen, [place] 03/10/18 [place], [place], PA-C 03/10/18 [place], Delice Bison, DO 03/14/18 [phone]

## 2018-03-10 NOTE — Progress Notes (Signed)
Update:   MRI revealed multiple scattered punctate strokes over bilateral cerebral hemispheres. Ordered ASA 325 x1 as well as ASA 81 mg (to start tomorrow) and atorva 40 mg QD. Neurology consulted, awaiting recs.   Welford Roche, MD  Internal Medicine PGY-2  P [phone]

## 2018-03-10 NOTE — ED Triage Notes (Signed)
Per family pt woke confused, not oriented to time, situation.  Pt knows how old he is and who he is but.  Pt having trouble following directions.

## 2018-03-10 NOTE — Progress Notes (Signed)
  Echocardiogram 2D Echocardiogram has been performed.  Darlina Sicilian M 03/10/2018, 1:53 PM

## 2018-03-10 NOTE — Consult Note (Addendum)
Referring Physician: Dr Isac Sarna    Reason for Consult: Neurologic opinion requested for tiny scattered strokes seen on MRI  HPI: Max Sanchez is an 53 y.o. male who is Spanish speaking only. Only known medical history is DM2 and reports having chronic numbness in both feet, but no other known PMH. The dtr states that he was taking some medicines (thinks this was for HTN) when he lived in [place] ~5-40yrs ago, but did not take his meds reliably and often stated that they "didn't work". He has not had much medical care since coming to the [place]. Per daughter the pt had c/o right side numbness when he went to bed around 11pm last night, but she nor the pt are able to tell me when that started. This has now gone away today since arrival to the hospital. Today he woke up confused and having trouble following directions. He did c/o headache and mild dizziness. Pts dtr says she has never seen him this way. CTH neg. MRI showed multiple tiny subcortical strokes bilaterally. No LVO, outside of tx window for IV tPA. He has been very hypertensive here. Labs show CRT 3.9, unable to do CTA. No prev history of renal failure. Will ck CUS/MRA head and Echo.   Past Medical History Past Medical History:  Diagnosis Date  . Diabetes mellitus without complication [facility])     Surgical History Past Surgical History:  Procedure Laterality Date  . FOOT SURGERY      Family History  No family history on file.  Social History:   reports that he has quit smoking. He does not have any smokeless tobacco history on file. No history on file for alcohol and drug.  Allergies:  No Known Allergies  Home Medications:  (Not in a hospital admission)   Hospital Medications . [START ON 03/11/2018] aspirin EC  81 mg Oral Daily  . aspirin  325 mg Oral Daily  . atorvastatin  40 mg Oral q1800  . heparin  5,000 Units Subcutaneous Q8H  . insulin aspart  0-15 Units Subcutaneous TID WC    ROS: unable  Physical Examination:   Vitals:   03/10/18 0715 03/10/18 0730 03/10/18 0824 03/10/18 0928  BP: (!) 171/95 (!) 165/87 (!) 149/92 122/77  Pulse: (!) 104 (!) 105 (!) 110 (!) 104  Resp: 19 (!) 21 10 15   Temp:      TempSrc:      SpO2: 100% 100% 100% 99%    General - moderate distress; appears stated age Heart - Regular rate and rhythm - no murmer appreciated Lungs - Clear to auscultation  Abdomen - Soft - non tender Extremities - Distal pulses intact - no edema Skin - Warm and dry   Neurologic Examination:  Mental Status: Alert, orients slowly, needs to be given options to pick from, but gets correct. Speech is slow, but fluent per interpreter without evidence of aphasia. Able to follow 3 step commands with some difficulty and psychomotor slowing. Cranial Nerves: II: Discs not visualized; Visual fields grossly normal, pupils equal, round, reactive to light III,IV, VI: ptosis not present, extra-ocular motions intact bilaterally V,VII: smile symmetric, facial light touch sensation normal bilaterally VIII: hearing normal bilaterally IX,X: gag reflex present XI: bilateral shoulder shrug XII: midline tongue extension Motor: RUE - 5/5    LUE - 5/5   RLE - 5/5    LLE - 5/5 Tone and bulk:normal tone throughout; no atrophy noted Sensory: Light touch intact throughout, bilaterally Deep Tendon Reflexes: 2+ and symmetric throughout  Plantars: Right: downgoing   Left: downgoing Cerebellar: normal finger-to-nose and normal heel-to-shin test Gait: deferred at this time.   NIHSS 1a Level of Conscious:0 1b LOC Questions: 0 1c LOC Commands: 0 2 Best Gaze: 0 3 Visual: 0 4 Facial Palsy: 0 5a Motor Arm - left: 0 5b Motor Arm - Right: 0 6a Motor Leg - Left: 0 6b Motor Leg - Right: 0 7 Limb Ataxia: 0 8 Sensory: 0 9 Best Language: 0 10 Dysarthria:0 11 Extinct. and Inattention:0 TOTAL: 0   LABORATORY STUDIES:  Basic Metabolic Panel: Recent Labs  Lab 03/10/18 0618 03/10/18 0622  NA 133* 134*  K 3.4*  3.5  CL 102 103  CO2 19*  --   GLUCOSE 193* 193*  BUN 37* 34*  CREATININE 3.87* 3.90*  CALCIUM 8.4*  --     Liver Function Tests: Recent Labs  Lab 03/10/18 0618  AST 16  ALT 16  ALKPHOS 69  BILITOT 0.9  PROT 6.3*  ALBUMIN 2.5*   No results for input(s): LIPASE, AMYLASE in the last 168 hours. No results for input(s): AMMONIA in the last 168 hours.  CBC: Recent Labs  Lab 03/10/18 0618 03/10/18 0622  WBC 10.2  --   NEUTROABS 8.2*  --   HGB 11.4* 11.2*  HCT 35.1* 33.0*  MCV 82.4  --   PLT 265  --     Cardiac Enzymes: No results for input(s): CKTOTAL, CKMB, CKMBINDEX, TROPONINI in the last 168 hours.  BNP: Invalid input(s): POCBNP  CBG: Recent Labs  Lab 03/10/18 0626 03/10/18 1200  GLUCAP 188* 165*    Microbiology:   Coagulation Studies: Recent Labs    03/10/18 0618  LABPROT 13.3  INR 1.02    Urinalysis:  Recent Labs  Lab 03/10/18 0612  COLORURINE YELLOW  LABSPEC 1.014  PHURINE 5.0  GLUCOSEU >=500*  HGBUR SMALL*  BILIRUBINUR NEGATIVE  KETONESUR 5*  PROTEINUR >=300*  NITRITE NEGATIVE  LEUKOCYTESUR NEGATIVE    Lipid Panel:  No results found for: CHOL, TRIG, HDL, CHOLHDL, VLDL, LDLCALC  HgbA1C:  Lab Results  Component Value Date   HGBA1C 7.3 (H) 03/10/2018    Urine Drug Screen:      Component Value Date/Time   LABOPIA NONE DETECTED 03/10/2018 0612   COCAINSCRNUR NONE DETECTED 03/10/2018 0612   LABBENZ NONE DETECTED 03/10/2018 0612   AMPHETMU NONE DETECTED 03/10/2018 0612   THCU NONE DETECTED 03/10/2018 0612   LABBARB NONE DETECTED 03/10/2018 0612     Alcohol Level:  Recent Labs  Lab 03/10/18 0619  ETH <10    Miscellaneous labs:  EKG  EKG   IMAGING: Dg Chest 2 View  Result Date: 03/10/2018 CLINICAL DATA:  Altered mental status with cough and vomiting EXAM: CHEST - 2 VIEW COMPARISON:  None. FINDINGS: Lungs are clear. Heart is borderline enlarged with pulmonary vascularity normal. No adenopathy. No bone lesions.  IMPRESSION: Borderline cardiac enlargement.  No edema or consolidation. Electronically Signed   By: Lowella Grip III M.D.   On: 03/10/2018 07:03   Ct Head Wo Contrast  Result Date: 03/10/2018 CLINICAL DATA:  Initial evaluation for acute altered mental status, confusion. EXAM: CT HEAD WITHOUT CONTRAST TECHNIQUE: Contiguous axial images were obtained from the base of the skull through the vertex without intravenous contrast. COMPARISON:  None available. FINDINGS: Brain: Generalized age-related cerebral atrophy with mild chronic small vessel ischemic disease. No acute intracranial hemorrhage. No acute large vessel territory infarct. No mass lesion, midline shift or mass effect. No hydrocephalus. No  extra-axial fluid collection. Vascular: No hyperdense vessel. Skull: Scalp soft tissues and calvarium within normal limits. Sinuses/Orbits: Globes and orbital soft tissues normal. Paranasal sinuses are largely clear. No mastoid effusion. Other: None. IMPRESSION: 1. No acute intracranial abnormality. 2. Generalized age-related cerebral atrophy with mild chronic small vessel ischemic disease. Electronically Signed   By: Jeannine Boga M.D.   On: 03/10/2018 06:29   Mr Brain Wo Contrast  Result Date: 03/10/2018 CLINICAL DATA:  Acute presentation with confusion. Headache and dizziness. EXAM: MRI HEAD WITHOUT CONTRAST TECHNIQUE: Multiplanar, multiecho pulse sequences of the brain and surrounding structures were obtained without intravenous contrast. COMPARISON:  Head CT same day FINDINGS: Brain: There are scattered punctate foci of restricted diffusion scattered within both cerebral hemispheres consistent with micro embolic infarctions from the heart or ascending aorta. None of these are larger than 2 or 3 mm. There is a background pattern of chronic small vessel ischemic change of the pons and moderate chronic small-vessel ischemic changes of the hemispheric white matter. There is an old right inferior frontal  cortical and subcortical infarction. No mass lesion, hemorrhage, hydrocephalus or extra-axial collection. Vascular: Major vessels at the base of the brain show flow. 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 No large or confluent infarction. Background pattern of chronic small vessel ischemic change. 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 Electronically Signed   By: Nelson Chimes M.D.   On: 03/10/2018 12:35   US Renal  Result Date: 03/10/2018 CLINICAL DATA:  53 year old male with history of acute kidney injury. EXAM: RENAL / URINARY TRACT ULTRASOUND COMPLETE COMPARISON:  None. FINDINGS: Right Kidney: Renal measurements: 12.7 x 5.2 x 5.7 cm = volume: 194.1 mL . Echogenicity within normal limits. No mass or hydronephrosis visualized. Left Kidney: Renal measurements: 12.8 x 6.4 x 5.3 cm = volume: 227.4 mL. Echogenicity within normal limits. In the interpolar region of the left kidney there is a 1.9 x 1.3 x 1.7 cm anechoic lesion with increased through transmission, compatible with a simple cyst. No hydronephrosis visualized. Bladder: Appears normal for degree of bladder distention. IMPRESSION: 1. Other than a simple cyst in the left kidney, the sonographic appearance of the kidneys is normal. Specifically, no hydronephrosis. Electronically Signed   By: Vinnie Langton M.D.   On: 03/10/2018 11:44      Assessment: 53 y.o. male with little known medical history arrives to ER with confusion, HTN emergency and ARF. Since BP has been better controlled in ER, his confusion has already improved upon my exam.There are no focal deficits and no current sensory deficits, but pt c/o right side sensory change yesterday evening for few hours before going to bed. Unclear if it was still present  this am as he was too confused to communicate properly. Work up included MRI brain, where micro tiny DWI changes are seen cortically bilaterally. We were consulted for stroke wk up.  Stroke Risk Factors - obesity, prev stroke, uncontrolled HTN, poor medical care/non-compliant  # Encephalopathy- multifactoral d/t ARF, HTN emergency, strokes. Although the tiny strokes alone would not cause this degree of confusion.  # Acute Ischemic strokes- scattered tiny embolic looking in bilat. Unable to do CTA d/t ARF. MRA head/CUS instead. Echo pending. Labs pending. # ARF- CRT 3.9- depending on how sudden this happened, this can certainly cause confusion. # HTN emergency- normotensive goals <160 given degree  of encephalopathy and no role in permissive HTN given size/locatin of infarcts.   Plan:  HgbA1c, fasting lipid panel  MRA  of the brain without contrast  PT consult, OT consult, Speech consult  Echocardiogram  Carotid dopplers  ASA already started by primary team  Statin Therapy - LDL goal < 70, Lipitor 40mg  just ordered  Risk factor modification  Telemetry monitoring  Frequent neuro checks  Fall Precautions  DVT prophelaxis NPO until swallowing evaluation has been passed (aspirin suppository if needed) Updated pt and family at bedside.   Desiree Metzger-Cihelka, ARNP-C, ANVP-BC  Attending Neurologist's note to follow    NEUROHOSPITALIST ADDENDUM Performed a face to face diagnostic evaluation.   I have reviewed the contents of history and physical exam as documented by PA/ARNP/Resident and agree with above documentation.  I have discussed and formulated the above plan as documented. Edits to the note have been made as needed.  Impression: 53 year old presenting with confusion in the setting of accelerated hypertension.  Patient also has elevated creatinine supporting diagnosis of hypertensive emergency.  On assessment by internal medicine team patient noted to have mild leg  weakness.  Key exam findings: Patient is alert and oriented.  Daughter helped translate to [place].  5 out of 5 strength in all 4 extremities. MRI brain was ordered which showed punctate subcortical infarcts-likely due to an vessel ischemia in the setting of hypertensive crisis.  Low suspicion for embolic phenomena-would recommend completion of stroke work-up with vascular imaging (MRI of the head and carotid Dopplers of the neck ) and echocardiogram.  BP goal will be bringing it below 540 systolic, would avoid hypotension.       Karena Addison  MD [facility] [phone]   If 7pm to 7am, please call on call as listed on AMION.

## 2018-03-10 NOTE — ED Notes (Signed)
ED TO INPATIENT HANDOFF REPORT  Name/Age/Gender Max Sanchez 53 y.o. male  Code Status    Code Status Orders  (From admission, onward)         Start     Ordered   03/10/18 0900  Full code  Continuous     03/10/18 0859        Code Status History    This patient has a current code status but no historical code status.      Home/SNF/Other Home  Chief Complaint confused  Level of Care/Admitting Diagnosis ED Disposition    ED Disposition Condition [facility] Area: [place] [100100]  Level of Care: Progressive [102]  Diagnosis: Confusion and disorientation [0998338]  Admitting Physician: Bosie Helper  Attending Physician: Lucious Groves [[id number]]  Estimated length of stay: past midnight tomorrow  Certification:: I certify this patient will need inpatient services for at least 2 midnights  PT Class (Do Not Modify): Inpatient [101]  PT Acc Code (Do Not Modify): Private [1]       Medical History Past Medical History:  Diagnosis Date  . Diabetes mellitus without complication ([place])     Allergies No Known Allergies  IV Location/Drains/Wounds Patient Lines/Drains/Airways Status   Active Line/Drains/Airways    Name:   Placement date:   Placement time:   Site:   Days:   Peripheral IV 03/10/18 Right;Upper Arm   03/10/18    0638    Arm   less than 1   Peripheral IV 03/10/18 Left Wrist   03/10/18    0639    Wrist   less than 1          Labs/Imaging Results for orders placed or performed during the hospital encounter of 03/10/18 (from the past 48 hour(s))  Urine rapid drug screen (hosp performed)     Status: None   Collection Time: 03/10/18  6:12 AM  Result Value Ref Range   Opiates NONE DETECTED NONE DETECTED   Cocaine NONE DETECTED NONE DETECTED   Benzodiazepines NONE DETECTED NONE DETECTED   Amphetamines NONE DETECTED NONE DETECTED   Tetrahydrocannabinol NONE DETECTED NONE DETECTED   Barbiturates NONE DETECTED  NONE DETECTED    Comment: (NOTE) DRUG SCREEN FOR MEDICAL PURPOSES ONLY.  IF CONFIRMATION IS NEEDED FOR ANY PURPOSE, NOTIFY LAB WITHIN 5 DAYS. LOWEST DETECTABLE LIMITS FOR URINE DRUG SCREEN Drug Class                     Cutoff (ng/mL) Amphetamine and metabolites    1000 Barbiturate and metabolites    200 Benzodiazepine                 250 Tricyclics and metabolites     300 Opiates and metabolites        300 Cocaine and metabolites        300 THC                            50 Performed at [facility], [place] [address] [place], [place] [address]   Urinalysis, Routine w reflex microscopic     Status: Abnormal   Collection Time: 03/10/18  6:12 AM  Result Value Ref Range   Color, Urine YELLOW YELLOW   APPearance CLEAR CLEAR   Specific Gravity, Urine 1.014 1.005 - 1.030   pH 5.0 5.0 - 8.0   Glucose, UA >=500 (A) NEGATIVE mg/dL  Hgb urine dipstick SMALL (A) NEGATIVE   Bilirubin Urine NEGATIVE NEGATIVE   Ketones, ur 5 (A) NEGATIVE mg/dL   Protein, ur >=300 (A) NEGATIVE mg/dL   Nitrite NEGATIVE NEGATIVE   Leukocytes, UA NEGATIVE NEGATIVE   RBC / HPF 6-10 0 - 5 RBC/hpf   WBC, UA 0-5 0 - 5 WBC/hpf   Bacteria, UA RARE (A) NONE SEEN   Squamous Epithelial / LPF 0-5 0 - 5   Mucus PRESENT    Sperm, UA PRESENT     Comment: Performed at [facility], [place] [address] [place], [place] [address]  Protime-INR     Status: None   Collection Time: 03/10/18  6:18 AM  Result Value Ref Range   Prothrombin Time 13.3 11.4 - 15.2 seconds   INR 1.02     Comment: Performed at [facility] [address], [place], [place] [address]  APTT     Status: None   Collection Time: 03/10/18  6:18 AM  Result Value Ref Range   aPTT 27 24 - 36 seconds    Comment: Performed at [facility] [address], [place], [place] [address]  CBC     Status: Abnormal   Collection Time: 03/10/18  6:18 AM  Result Value Ref Range   WBC 10.2 4.0 - 10.5 K/uL   RBC 4.26 4.22 - 5.81 MIL/uL    Hemoglobin 11.4 (L) 13.0 - 17.0 g/dL   HCT 35.1 (L) 39.0 - 52.0 %   MCV 82.4 80.0 - 100.0 fL   MCH 26.8 26.0 - 34.0 pg   MCHC 32.5 30.0 - 36.0 g/dL   RDW 12.8 11.5 - 15.5 %   Platelets 265 150 - 400 K/uL   nRBC 0.0 0.0 - 0.2 %    Comment: Performed at [facility], [place] [address] [place], [place] [address]  Differential     Status: Abnormal   Collection Time: 03/10/18  6:18 AM  Result Value Ref Range   Neutrophils Relative % 80 %   Neutro Abs 8.2 (H) 1.7 - 7.7 K/uL   Lymphocytes Relative 13 %   Lymphs Abs 1.3 0.7 - 4.0 K/uL   Monocytes Relative 5 %   Monocytes Absolute 0.5 0.1 - 1.0 K/uL   Eosinophils Relative 1 %   Eosinophils Absolute 0.1 0.0 - 0.5 K/uL   Basophils Relative 1 %   Basophils Absolute 0.1 0.0 - 0.1 K/uL   Immature Granulocytes 0 %   Abs Immature Granulocytes 0.03 0.00 - 0.07 K/uL    Comment: Performed at [facility] [address] [place], [place] [address]  Comprehensive metabolic panel     Status: Abnormal   Collection Time: 03/10/18  6:18 AM  Result Value Ref Range   Sodium 133 (L) 135 - 145 mmol/L   Potassium 3.4 (L) 3.5 - 5.1 mmol/L   Chloride 102 98 - 111 mmol/L   CO2 19 (L) 22 - 32 mmol/L   Glucose, Bld 193 (H) 70 - 99 mg/dL   BUN 37 (H) 6 - 20 mg/dL   Creatinine, Ser 3.87 (H) 0.61 - 1.24 mg/dL   Calcium 8.4 (L) 8.9 - 10.3 mg/dL   Total Protein 6.3 (L) 6.5 - 8.1 g/dL   Albumin 2.5 (L) 3.5 - 5.0 g/dL   AST 16 15 - 41 U/L   ALT 16 0 - 44 U/L   Alkaline Phosphatase 69 38 - 126 U/L   Total Bilirubin 0.9 0.3 -  1.2 mg/dL   GFR calc non Af Amer 17 (L) >60 mL/min   GFR calc Af Amer 20 (L) >60 mL/min   Anion gap 12 5 - 15    Comment: Performed at [facility] [address], [place], [place] [address]  Hemoglobin A1c     Status: Abnormal   Collection Time: 03/10/18  6:18 AM  Result Value Ref Range   Hgb A1c MFr Bld 7.3 (H) 4.8 - 5.6 %    Comment: (NOTE) Pre diabetes:          5.7%-6.4% Diabetes:              >6.4% Glycemic  control for   <7.0% adults with diabetes    Mean Plasma Glucose 162.81 mg/dL    Comment: Performed at [facility] [address], [place], [place] [address]  Sodium, urine, random     Status: None   Collection Time: 03/10/18  6:18 AM  Result Value Ref Range   Sodium, Ur 64 mmol/L    Comment: Performed at [facility] [address] [place], [place] [address]  Creatinine, urine, random     Status: None   Collection Time: 03/10/18  6:18 AM  Result Value Ref Range   Creatinine, Urine 82.95 mg/dL    Comment: Performed at [facility] [address], [place], [place] [address]  Lipid panel     Status: Abnormal   Collection Time: 03/10/18  6:18 AM  Result Value Ref Range   Cholesterol 359 (H) 0 - 200 mg/dL   Triglycerides 312 (H) <150 mg/dL   HDL 42 >40 mg/dL   Total CHOL/HDL Ratio 8.5 RATIO   VLDL 62 (H) 0 - 40 mg/dL   LDL Cholesterol 255 (H) 0 - 99 mg/dL    Comment:        Total Cholesterol/HDL:CHD Risk Coronary Heart Disease Risk Table                     Men   Women  1/2 Average Risk   3.4   3.3  Average Risk       5.0   4.4  2 X Average Risk   9.6   7.1  3 X Average Risk  23.4   11.0        Use the calculated Patient Ratio above and the CHD Risk Table to determine the patient's CHD Risk.        ATP III CLASSIFICATION (LDL):  <100     mg/dL   Optimal  100-129  mg/dL   Near or Above                    Optimal  130-159  mg/dL   Borderline  160-189  mg/dL   High  >190     mg/dL   Very High Performed at [facility] [address], [place], [place] [address]   I-stat troponin, ED     Status: None   Collection Time: 03/10/18  6:19 AM  Result Value Ref Range   Troponin i, poc 0.01 0.00 - 0.08 ng/mL   Comment 3            Comment: Due to the release kinetics of cTnI, a negative result within the first hours of the onset of symptoms does not rule out myocardial infarction with certainty. If myocardial infarction is still suspected, repeat  the test at appropriate intervals.  Ethanol     Status: None   Collection Time: 03/10/18  6:19 AM  Result Value Ref Range   Alcohol, Ethyl (B) <10 <10 mg/dL    Comment: (NOTE) Lowest detectable limit for serum alcohol is 10 mg/dL. For medical purposes only. Performed at [facility], [place] [address], [place], [place] [address]   I-Stat Chem 8, ED     Status: Abnormal   Collection Time: 03/10/18  6:22 AM  Result Value Ref Range   Sodium 134 (L) 135 - 145 mmol/L   Potassium 3.5 3.5 - 5.1 mmol/L   Chloride 103 98 - 111 mmol/L   BUN 34 (H) 6 - 20 mg/dL   Creatinine, Ser 3.90 (H) 0.61 - 1.24 mg/dL   Glucose, Bld 193 (H) 70 - 99 mg/dL   Calcium, Ion 1.08 (L) 1.15 - 1.40 mmol/L   TCO2 22 22 - 32 mmol/L   Hemoglobin 11.2 (L) 13.0 - 17.0 g/dL   HCT 33.0 (L) 39.0 - 52.0 %  CBG monitoring, ED     Status: Abnormal   Collection Time: 03/10/18  6:26 AM  Result Value Ref Range   Glucose-Capillary 188 (H) 70 - 99 mg/dL  CBG monitoring, ED     Status: Abnormal   Collection Time: 03/10/18 12:00 PM  Result Value Ref Range   Glucose-Capillary 165 (H) 70 - 99 mg/dL  CBG monitoring, ED     Status: Abnormal   Collection Time: 03/10/18  4:44 PM  Result Value Ref Range   Glucose-Capillary 69 (L) 70 - 99 mg/dL   Dg Chest 2 View  Result Date: 03/10/2018 CLINICAL DATA:  Altered mental status with cough and vomiting EXAM: CHEST - 2 VIEW COMPARISON:  None. FINDINGS: Lungs are clear. Heart is borderline enlarged with pulmonary vascularity normal. No adenopathy. No bone lesions. IMPRESSION: Borderline cardiac enlargement.  No edema or consolidation. Electronically Signed   By: Lowella Grip III M.D.   On: 03/10/2018 07:03   Ct Head Wo Contrast  Result Date: 03/10/2018 CLINICAL DATA:  Initial evaluation for acute altered mental status, confusion. EXAM: CT HEAD WITHOUT CONTRAST TECHNIQUE: Contiguous axial images were obtained from the base of the skull through the vertex without intravenous  contrast. COMPARISON:  None available. FINDINGS: Brain: Generalized age-related cerebral atrophy with mild chronic small vessel ischemic disease. No acute intracranial hemorrhage. No acute large vessel territory infarct. No mass lesion, midline shift or mass effect. No hydrocephalus. No extra-axial fluid collection. Vascular: No hyperdense vessel. Skull: Scalp soft tissues and calvarium within normal limits. Sinuses/Orbits: Globes and orbital soft tissues normal. Paranasal sinuses are largely clear. No mastoid effusion. Other: None. IMPRESSION: 1. No acute intracranial abnormality. 2. Generalized age-related cerebral atrophy with mild chronic small vessel ischemic disease. Electronically Signed   By: Jeannine Boga M.D.   On: 03/10/2018 06:29   Mr Jodene Nam Head Wo Contrast  Result Date: 03/10/2018 CLINICAL DATA:  Followup scattered acute infarctions EXAM: MRA HEAD WITHOUT CONTRAST TECHNIQUE: Angiographic images of the Circle of Willis were obtained using MRA technique without intravenous contrast. COMPARISON:  MRI same day FINDINGS: Both internal carotid arteries are patent through the skull base and siphon regions. The anterior and middle cerebral vessels are patent. There may be acute or subacute occlusion of the A1 segment on the right. Question 3 mm aneurysm of the anterior communicating region/proximal left A1 segment. I would consider CT angiography for more accurate evaluation of this region. Middle cerebral vessels are patent. There is mild stenosis and  irregularity of the M1 segment on the right, stenosis estimated at about 20%. Both vertebral arteries are patent with the left being dominant. There is atherosclerotic narrowing and irregularity of both V4 segments and of the proximal basilar artery. Proximal basilar stenosis estimated at 50%. There is the possibility of a proximal basilar dissection. Superior cerebellar and posterior cerebral vessels show flow. There is stenosis of the P2 segment on the  right, 50% or greater. IMPRESSION: Multiple intracranial findings that may benefit from CT angiographic evaluation. Possible acute or subacute occlusion of the A1 segment on the right. Question aneurysm of the anterior communicating artery region measuring up to 3 mm in size. Narrowing and irregularity of the M1 segment on the right, estimated severity about 20%. Abnormal appearance of the proximal basilar artery raising the possibility of a localized dissection. 50% stenosis in region. 50% stenosis of the P2 segment on the right. Electronically Signed   By: Nelson Chimes M.D.   On: 03/10/2018 15:15   Mr Brain Wo Contrast  Result Date: 03/10/2018 CLINICAL DATA:  Acute presentation with confusion. Headache and dizziness. EXAM: MRI HEAD WITHOUT CONTRAST TECHNIQUE: Multiplanar, multiecho pulse sequences of the brain and surrounding structures were obtained without intravenous contrast. COMPARISON:  Head CT same day FINDINGS: Brain: There are scattered punctate foci of restricted diffusion scattered within both cerebral hemispheres consistent with micro embolic infarctions from the heart or ascending aorta. None of these are larger than 2 or 3 mm. There is a background pattern of chronic small vessel ischemic change of the pons and moderate chronic small-vessel ischemic changes of the hemispheric white matter. There is an old right inferior frontal cortical and subcortical infarction. No mass lesion, hemorrhage, hydrocephalus or extra-axial collection. Vascular: Major vessels at the base of the brain show flow. 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 No large or confluent infarction. Background pattern of chronic small vessel ischemic change. 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  Electronically Signed   By: Nelson Chimes M.D.   On: 03/10/2018 12:35   US Renal  Result Date: 03/10/2018 CLINICAL DATA:  53 year old male with history of acute kidney injury. EXAM: RENAL / URINARY TRACT ULTRASOUND COMPLETE COMPARISON:  None. FINDINGS: Right Kidney: Renal measurements: 12.7 x 5.2 x 5.7 cm = volume: 194.1 mL . Echogenicity within normal limits. No mass or hydronephrosis visualized. Left Kidney: Renal measurements: 12.8 x 6.4 x 5.3 cm = volume: 227.4 mL. Echogenicity within normal limits. In the interpolar region of the left kidney there is a 1.9 x 1.3 x 1.7 cm anechoic lesion with increased through transmission, compatible with a simple cyst. No hydronephrosis visualized. Bladder: Appears normal for degree of bladder distention. IMPRESSION: 1. Other than a simple cyst in the left kidney, the sonographic appearance of the kidneys is normal. Specifically, no hydronephrosis. Electronically Signed   By: Vinnie Langton M.D.   On: 03/10/2018 11:44   EKG Interpretation  Date/Time:  Wednesday March 10 2018 05:53:43 EST Ventricular Rate:  98 PR Interval:  120 QRS Duration: 90 QT Interval:  394 QTC Calculation: 503 R Axis:   71 Text Interpretation:  Normal sinus rhythm Nonspecific ST abnormality Prolonged QT Abnormal ECG No old tracing to compare Confirmed by Pryor Curia [phone]) on 03/10/2018 6:12:53 AM Also confirmed by Ward, Cyril Mourning [phone]), editor Philomena Doheny [phone])  on 03/10/2018 7:04:48 AM  Pending Labs Unresulted Labs (From admission, onward)    Start     Ordered   03/11/18 3291  Basic metabolic panel  Tomorrow morning,   R     03/10/18 0859   03/11/18 0500  CBC  Tomorrow morning,   R     03/10/18 0859   03/10/18 0900  HIV antibody (Routine Testing)  Add-on,   R     03/10/18 0859          Vitals/Pain Today's Vitals   03/10/18 0824 03/10/18 0833 03/10/18 0928 03/10/18 0933  BP: (!) 149/92  122/77   Pulse: (!) 110  (!) 104   Resp: 10  15   Temp:      TempSrc:       SpO2: 100%  99%   PainSc:  0-No pain  3     Isolation Precautions No active isolations  Medications Medications  heparin injection 5,000 Units (5,000 Units Subcutaneous Given 03/10/18 1657)  acetaminophen (TYLENOL) tablet 650 mg (650 mg Oral Given 03/10/18 1701)    Or  acetaminophen (TYLENOL) suppository 650 mg ( Rectal See Alternative 03/10/18 1701)  senna-docusate (Senokot-S) tablet 1 tablet (has no administration in time range)  insulin aspart (novoLOG) injection 0-15 Units (0 Units Subcutaneous Not Given 03/10/18 1658)  aspirin EC tablet 81 mg (has no administration in time range)  atorvastatin (LIPITOR) tablet 40 mg (40 mg Oral Given 03/10/18 1657)  hydrALAZINE (APRESOLINE) injection 5 mg (5 mg Intravenous Given 03/10/18 0708)  aspirin tablet 325 mg (325 mg Oral Given 03/10/18 1657)  ondansetron (ZOFRAN) injection 4 mg (4 mg Intravenous Given 03/10/18 1920)    Mobility walks

## 2018-03-10 NOTE — ED Notes (Signed)
Report attempted again, RN not answering, bedside report to be given.

## 2018-03-10 NOTE — ED Notes (Signed)
IM made aware of patient bp. NO new orders at this time, please continue to monitor and document BP

## 2018-03-11 ENCOUNTER — Inpatient Hospital Stay (HOSPITAL_COMMUNITY): Payer: Self-pay

## 2018-03-11 DIAGNOSIS — H532 Diplopia: Secondary | ICD-10-CM

## 2018-03-11 DIAGNOSIS — I639 Cerebral infarction, unspecified: Secondary | ICD-10-CM

## 2018-03-11 DIAGNOSIS — I161 Hypertensive emergency: Secondary | ICD-10-CM

## 2018-03-11 DIAGNOSIS — H539 Unspecified visual disturbance: Secondary | ICD-10-CM

## 2018-03-11 LAB — GLUCOSE, CAPILLARY
Glucose-Capillary: 133 mg/dL — ABNORMAL HIGH (ref 70–99)
Glucose-Capillary: 153 mg/dL — ABNORMAL HIGH (ref 70–99)
Glucose-Capillary: 60 mg/dL — ABNORMAL LOW (ref 70–99)
Glucose-Capillary: 104 mg/dL — ABNORMAL HIGH (ref 70–99)
Glucose-Capillary: 100 mg/dL — ABNORMAL HIGH (ref 70–99)

## 2018-03-11 LAB — CBC
HCT: 33.3 % — ABNORMAL LOW (ref 39.0–52.0)
HEMOGLOBIN: 10.7 g/dL — AB (ref 13.0–17.0)
MCH: 26.5 pg (ref 26.0–34.0)
MCHC: 32.1 g/dL (ref 30.0–36.0)
MCV: 82.4 fL (ref 80.0–100.0)
Platelets: 255 10*3/uL (ref 150–400)
RBC: 4.04 MIL/uL — ABNORMAL LOW (ref 4.22–5.81)
RDW: 13 % (ref 11.5–15.5)
WBC: 7.3 10*3/uL (ref 4.0–10.5)
nRBC: 0 % (ref 0.0–0.2)

## 2018-03-11 LAB — BASIC METABOLIC PANEL
Anion gap: 11 (ref 5–15)
BUN: 35 mg/dL — ABNORMAL HIGH (ref 6–20)
CO2: 21 mmol/L — ABNORMAL LOW (ref 22–32)
Calcium: 8.3 mg/dL — ABNORMAL LOW (ref 8.9–10.3)
Chloride: 103 mmol/L (ref 98–111)
Creatinine, Ser: 3.86 mg/dL — ABNORMAL HIGH (ref 0.61–1.24)
GFR calc Af Amer: 20 mL/min — ABNORMAL LOW (ref >60)
GFR calc non Af Amer: 17 mL/min — ABNORMAL LOW (ref >60)
Glucose, Bld: 125 mg/dL — ABNORMAL HIGH (ref 70–99)
POTASSIUM: 3.9 mmol/L (ref 3.5–5.1)
Sodium: 135 mmol/L (ref 135–145)

## 2018-03-11 LAB — HIV ANTIBODY (ROUTINE TESTING W REFLEX): HIV Screen 4th Generation wRfx: NONREACTIVE

## 2018-03-11 MED ORDER — CLOPIDOGREL BISULFATE 75 MG PO TABS
75.0000 mg | ORAL_TABLET | Freq: Every day | ORAL | Status: DC
  Administered 2018-03-11 – 2018-03-12 (×2): 75 mg via ORAL
  Filled 2018-03-11 (×2): qty 1

## 2018-03-11 MED ORDER — LABETALOL HCL 100 MG PO TABS
100.0000 mg | ORAL_TABLET | Freq: Two times a day (BID) | ORAL | Status: DC
  Administered 2018-03-11: 100 mg via ORAL
  Filled 2018-03-11: qty 1

## 2018-03-11 MED ORDER — LABETALOL HCL 100 MG PO TABS
100.0000 mg | ORAL_TABLET | Freq: Three times a day (TID) | ORAL | Status: DC
  Administered 2018-03-11 – 2018-03-12 (×3): 100 mg via ORAL
  Filled 2018-03-11 (×3): qty 1

## 2018-03-11 NOTE — Evaluation (Signed)
Occupational Therapy Evaluation Patient Details Name: Max Sanchez MRN: [id number] DOB: 1966-01-24 Today's Date: 03/11/2018    History of Present Illness Pt is a 53 y/o male admitted secondary to confusion. MRI revealed numerous punctate acute infarcts. PMH including but not limited to DM   Clinical Impression   Pt admitted with the above diagnoses and presents with below problem list. Pt will benefit from continued acute OT to address the below listed deficits and maximize independence with basic ADLs prior to d/c home. PTA pt was independent with ADLs, does not work, mostly at home per daughter. Pt currently at/near baseline with basic ADLs. Possibly some higher level cognitive deficits (decreased attention span and multiple step command following). Discussed having some oversight at least initially for medication management. Also discussed option of formal neurocognitive testing as OP if cognitive deficits noted by pt or family as pt returns to daily life. Daughter present and interpreting throughout session. Of note, daughter reports pt does have a h/o financial barrier to obtaining his insulin medication.      Follow Up Recommendations  No OT follow up;Supervision - Intermittent    Equipment Recommendations  None recommended by OT    Recommendations for Other Services       Precautions / Restrictions Precautions Precautions: None Restrictions Weight Bearing Restrictions: No      Mobility Bed Mobility Overal bed mobility: Modified Independent             General bed mobility comments: sitting EOB. suspect mod I  Transfers Overall transfer level: Modified independent Equipment used: None                  Balance Overall balance assessment: Needs assistance Sitting-balance support: Feet supported Sitting balance-Leahy Scale: Good     Standing balance support: No upper extremity supported Standing balance-Leahy Scale: Good                              ADL either performed or assessed with clinical judgement   ADL Overall ADL's : Modified independent                                       General ADL Comments: mod I to independent with basic ADLs. Discussed having some oversight for medication management.     Vision Patient Visual Report: No change from baseline       Perception     Praxis      Pertinent Vitals/Pain Pain Assessment: Faces Faces Pain Scale: No hurt     Hand Dominance     Extremity/Trunk Assessment Upper Extremity Assessment Upper Extremity Assessment: Overall WFL for tasks assessed   Lower Extremity Assessment Lower Extremity Assessment: Defer to PT evaluation;Overall [facility] for tasks assessed   Cervical / Trunk Assessment Cervical / Trunk Assessment: Normal   Communication Communication Communication: Prefers language other than [place];Other (comment)(daughter interpreting during session)   Cognition Arousal/Alertness: Awake/alert Behavior During Therapy: WFL for tasks assessed/performed Overall Cognitive Status: Within Functional Limits for tasks assessed                                 General Comments: WFL for basic tasks assessed. Some mild decreased attention and multiple step command following was spotty at times. Able to walk in the hall while tossing a  small ball from one hand to the other and count backwards by 2s. Some mild difficulty finding hospital room after ambulating in the halls. Discussed option to have throughout neurocognitive eval as OP if cognitive deficits noted once returning to everyday tasks.   General Comments       Exercises     Shoulder Instructions      Home Living Family/patient expects to be discharged to:: Private residence Living Arrangements: Children Available Help at Discharge: Family;Available 24 hours/day   Home Access: Level entry     Home Layout: One level               Home Equipment: None           Prior Functioning/Environment Level of Independence: Independent        Comments: does not work        OT Problem List: Impaired balance (sitting and/or standing);Decreased knowledge of use of DME or AE;Decreased knowledge of precautions;Decreased cognition      OT Treatment/Interventions: Self-care/ADL training;DME and/or AE instruction;Therapeutic activities;Cognitive remediation/compensation;Balance training;Patient/family education    OT Goals(Current goals can be found in the care plan section) Acute Rehab OT Goals Patient Stated Goal: return home  OT Frequency: Min 2X/week   Barriers to D/C:            Co-evaluation              AM-PAC OT "6 Clicks" Daily Activity     Outcome Measure Help from another person eating meals?: None Help from another person taking care of personal grooming?: None Help from another person toileting, which includes using toliet, bedpan, or urinal?: None Help from another person bathing (including washing, rinsing, drying)?: None Help from another person to put on and taking off regular upper body clothing?: None Help from another person to put on and taking off regular lower body clothing?: None 6 Click Score: 24   End of Session    Activity Tolerance: Patient tolerated treatment well Patient left: with call bell/phone within reach;with family/visitor present(sitting EOB)  OT Visit Diagnosis: Unsteadiness on feet (R26.81);Other symptoms and signs involving cognitive function                Time: 1211-1228 OT Time Calculation (min): 17 min Charges:  OT General Charges $OT Visit: 1 Visit OT Evaluation $OT Eval Low Complexity: [place], OT Acute Rehabilitation Services Pager: [phone] Office: [phone]   Hortencia Pilar 03/11/2018, 12:45 PM

## 2018-03-11 NOTE — Evaluation (Signed)
Physical Therapy Evaluation Patient Details Name: Max Sanchez MRN: [id number] DOB: 1966/02/07 Today's Date: 03/11/2018   History of Present Illness  Pt is a 53 y/o male admitted secondary to confusion. MRI revealed numerous punctate acute infarcts. PMH including but not limited to DM    Clinical Impression  Pt presented supine in bed with HOB elevated, awake and willing to participate in therapy session. Pt's daughter present throughout and video interpreter utilized throughout as well. Prior to admission, pt was independent with all functional mobility and ADLs. Pt currently overall supervision to modified independence with all mobility. No instability or difficulties noted throughout. Pt reported that he feels he is back to his baseline in regards to cognition and mobility. No further acute PT needs identified at this time. PT signing off.     Follow Up Recommendations No PT follow up    Equipment Recommendations  None recommended by PT    Recommendations for Other Services       Precautions / Restrictions Precautions Precautions: None Restrictions Weight Bearing Restrictions: No      Mobility  Bed Mobility Overal bed mobility: Modified Independent                Transfers Overall transfer level: Modified independent Equipment used: None                Ambulation/Gait Ambulation/Gait assistance: Supervision Gait Distance (Feet): 200 Feet Assistive device: None Gait Pattern/deviations: Step-through pattern;Decreased stride length Gait velocity: WFL   General Gait Details: no instability noted throughout, no difficulty ambulating over flat, even surfaces; unable to accurately assess any higher level balance tasks secondary to language barrier  Stairs            Wheelchair Mobility    Modified Rankin (Stroke Patients Only) Modified Rankin (Stroke Patients Only) Pre-Morbid Rankin Score: No symptoms Modified Rankin: No symptoms     Balance  Overall balance assessment: Needs assistance Sitting-balance support: Feet supported Sitting balance-Leahy Scale: Good     Standing balance support: No upper extremity supported Standing balance-Leahy Scale: Good                               Pertinent Vitals/Pain Pain Assessment: No/denies pain    Home Living Family/patient expects to be discharged to:: Private residence Living Arrangements: Children Available Help at Discharge: Family;Available 24 hours/day   Home Access: Level entry     Home Layout: One level Home Equipment: None      Prior Function Level of Independence: Independent         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, newspaper        Extremity/Trunk Assessment   Upper Extremity Assessment Upper Extremity Assessment: Defer to OT evaluation;Overall [facility] for tasks assessed    Lower Extremity Assessment Lower Extremity Assessment: Overall WFL for tasks assessed    Cervical / Trunk Assessment Cervical / Trunk Assessment: Normal  Communication   Communication: Prefers language other than [place];Other (comment)(video interpreter utilized throughout)  Cognition Arousal/Alertness: Awake/alert Behavior During Therapy: WFL for tasks assessed/performed Overall Cognitive Status: Within Functional Limits for tasks assessed                                 General Comments: cognition not formally assessed but [facility] for general conversation; difficult to accurately assess secondary to language barrier  General Comments      Exercises     Assessment/Plan    PT Assessment Patent does not need any further PT services  PT Problem List         PT Treatment Interventions      PT Goals (Current goals can be found in the Care Plan section)  Acute Rehab PT Goals Patient Stated Goal: return home PT Goal Formulation: All assessment and education complete, DC therapy    Frequency     Barriers to discharge         Co-evaluation               AM-PAC PT "6 Clicks" Mobility  Outcome Measure Help needed turning from your back to your side while in a flat bed without using bedrails?: None Help needed moving from lying on your back to sitting on the side of a flat bed without using bedrails?: None Help needed moving to and from a bed to a chair (including a wheelchair)?: None Help needed standing up from a chair using your arms (e.g., wheelchair or bedside chair)?: None Help needed to walk in hospital room?: None Help needed climbing 3-5 steps with a railing? : A Little 6 Click Score: 23    End of Session Equipment Utilized During Treatment: Gait belt Activity Tolerance: Patient tolerated treatment well Patient left: in bed;with call bell/phone within reach;with family/visitor present;Other (comment)(sitting EOB) Nurse Communication: Mobility status PT Visit Diagnosis: Other symptoms and signs involving the nervous system (H37.169)    Time: 6789-3810 PT Time Calculation (min) (ACUTE ONLY): 19 min   Charges:   PT Evaluation $PT Eval Moderate Complexity: 1 Mod          Sherie Don, PT, DPT  Acute Rehabilitation Services Pager [phone] Office [place] 03/11/2018, 11:12 AM

## 2018-03-11 NOTE — Progress Notes (Addendum)
STROKE TEAM PROGRESS NOTE   INTERVAL HISTORY His family is at the bedside.  Patient awake, sitting on side of bed, NAD. Spanish language interpreter used.  Vitals:   03/11/18 0241 03/11/18 0323 03/11/18 0620 03/11/18 0729  BP: (!) 149/79 (!) 168/85 (!) 177/91 111/61  Pulse:  (!) 105  73  Resp:  18  15  Temp:  98 F (36.7 C)    TempSrc:  Oral    SpO2:  98%    Weight:      Height:        CBC:  Recent Labs  Lab 03/10/18 0618 03/10/18 0622 03/11/18 0518  WBC 10.2  --  7.3  NEUTROABS 8.2*  --   --   HGB 11.4* 11.2* 10.7*  HCT 35.1* 33.0* 33.3*  MCV 82.4  --  82.4  PLT 265  --  297    Basic Metabolic Panel:  Recent Labs  Lab 03/10/18 0618 03/10/18 0622 03/11/18 0518  NA 133* 134* 135  K 3.4* 3.5 3.9  CL 102 103 103  CO2 19*  --  21*  GLUCOSE 193* 193* 125*  BUN 37* 34* 35*  CREATININE 3.87* 3.90* 3.86*  CALCIUM 8.4*  --  8.3*   Lipid Panel:     Component Value Date/Time   CHOL 359 (H) 03/10/2018 0618   TRIG 312 (H) 03/10/2018 0618   HDL 42 03/10/2018 0618   CHOLHDL 8.5 03/10/2018 0618   VLDL 62 (H) 03/10/2018 0618   LDLCALC 255 (H) 03/10/2018 0618   HgbA1c:  Lab Results  Component Value Date   HGBA1C 7.3 (H) 03/10/2018   Urine Drug Screen:     Component Value Date/Time   LABOPIA NONE DETECTED 03/10/2018 0612   COCAINSCRNUR NONE DETECTED 03/10/2018 0612   LABBENZ NONE DETECTED 03/10/2018 0612   AMPHETMU NONE DETECTED 03/10/2018 0612   THCU NONE DETECTED 03/10/2018 0612   LABBARB NONE DETECTED 03/10/2018 0612    Alcohol Level     Component Value Date/Time   ETH <10 03/10/2018 0619    IMAGING Dg Chest 2 View  Result Date: 03/10/2018 CLINICAL DATA:  Altered mental status with cough and vomiting EXAM: CHEST - 2 VIEW COMPARISON:  None. FINDINGS: Lungs are clear. Heart is borderline enlarged with pulmonary vascularity normal. No adenopathy. No bone lesions. IMPRESSION: Borderline cardiac enlargement.  No edema or consolidation. Electronically Signed    By: Lowella Grip III M.D.   On: 03/10/2018 07:03   Ct Head Wo Contrast  Result Date: 03/10/2018 CLINICAL DATA:  Initial evaluation for acute altered mental status, confusion. EXAM: CT HEAD WITHOUT CONTRAST TECHNIQUE: Contiguous axial images were obtained from the base of the skull through the vertex without intravenous contrast. COMPARISON:  None available. FINDINGS: Brain: Generalized age-related cerebral atrophy with mild chronic small vessel ischemic disease. No acute intracranial hemorrhage. No acute large vessel territory infarct. No mass lesion, midline shift or mass effect. No hydrocephalus. No extra-axial fluid collection. Vascular: No hyperdense vessel. Skull: Scalp soft tissues and calvarium within normal limits. Sinuses/Orbits: Globes and orbital soft tissues normal. Paranasal sinuses are largely clear. No mastoid effusion. Other: None. IMPRESSION: 1. No acute intracranial abnormality. 2. Generalized age-related cerebral atrophy with mild chronic small vessel ischemic disease. Electronically Signed   By: Jeannine Boga M.D.   On: 03/10/2018 06:29   Mr Jodene Nam Head Wo Contrast  Result Date: 03/10/2018 CLINICAL DATA:  Followup scattered acute infarctions EXAM: MRA HEAD WITHOUT CONTRAST TECHNIQUE: Angiographic images of the Circle of Willis were  obtained using MRA technique without intravenous contrast. COMPARISON:  MRI same day FINDINGS: Both internal carotid arteries are patent through the skull base and siphon regions. The anterior and middle cerebral vessels are patent. There may be acute or subacute occlusion of the A1 segment on the right. Question 3 mm aneurysm of the anterior communicating region/proximal left A1 segment. I would consider CT angiography for more accurate evaluation of this region. Middle cerebral vessels are patent. There is mild stenosis and irregularity of the M1 segment on the right, stenosis estimated at about 20%. Both vertebral arteries are patent with the left  being dominant. There is atherosclerotic narrowing and irregularity of both V4 segments and of the proximal basilar artery. Proximal basilar stenosis estimated at 50%. There is the possibility of a proximal basilar dissection. Superior cerebellar and posterior cerebral vessels show flow. There is stenosis of the P2 segment on the right, 50% or greater. IMPRESSION: Multiple intracranial findings that may benefit from CT angiographic evaluation. Possible acute or subacute occlusion of the A1 segment on the right. Question aneurysm of the anterior communicating artery region measuring up to 3 mm in size. Narrowing and irregularity of the M1 segment on the right, estimated severity about 20%. Abnormal appearance of the proximal basilar artery raising the possibility of a localized dissection. 50% stenosis in region. 50% stenosis of the P2 segment on the right. Electronically Signed   By: Nelson Chimes M.D.   On: 03/10/2018 15:15   Mr Brain Wo Contrast  Result Date: 03/10/2018 CLINICAL DATA:  Acute presentation with confusion. Headache and dizziness. EXAM: MRI HEAD WITHOUT CONTRAST TECHNIQUE: Multiplanar, multiecho pulse sequences of the brain and surrounding structures were obtained without intravenous contrast. COMPARISON:  Head CT same day FINDINGS: Brain: There are scattered punctate foci of restricted diffusion scattered within both cerebral hemispheres consistent with micro embolic infarctions from the heart or ascending aorta. None of these are larger than 2 or 3 mm. There is a background pattern of chronic small vessel ischemic change of the pons and moderate chronic small-vessel ischemic changes of the hemispheric white matter. There is an old right inferior frontal cortical and subcortical infarction. No mass lesion, hemorrhage, hydrocephalus or extra-axial collection. Vascular: Major vessels at the base of the brain show flow. Skull and upper cervical spine: Negative Sinuses/Orbits: Clear/normal Other: None  IMPRESSION: Numerous punctate acute infarctions scattered within both cerebral hemispheres consistent with micro embolic infarctions from the heart or ascending aorta. No large or confluent infarction. Background pattern of chronic small vessel ischemic change. 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 Electronically Signed   By: Nelson Chimes M.D.   On: 03/10/2018 12:35   US Renal  Result Date: 03/10/2018 CLINICAL DATA:  53 year old male with history of acute kidney injury. EXAM: RENAL / URINARY TRACT ULTRASOUND COMPLETE COMPARISON:  None. FINDINGS: Right Kidney: Renal measurements: 12.7 x 5.2 x 5.7 cm = volume: 194.1 mL . Echogenicity within normal limits. No mass or hydronephrosis visualized. Left Kidney: Renal measurements: 12.8 x 6.4 x 5.3 cm = volume: 227.4 mL. Echogenicity within normal limits. In the interpolar region of the left kidney there is a 1.9 x 1.3 x 1.7 cm anechoic lesion with increased through transmission, compatible with a simple cyst. No hydronephrosis visualized. Bladder: Appears normal for degree of bladder distention. IMPRESSION: 1. Other than a simple cyst in the left kidney, the sonographic appearance of the kidneys is normal. Specifically, no hydronephrosis. Electronically Signed   By: Mauri Brooklyn.D.  On: 03/10/2018 11:44    PHYSICAL EXAM : obese middle aged hispanic male not in distress. . Afebrile. Head is nontraumatic. Neck is supple without bruit.    Cardiac exam no murmur or gallop. Lungs are clear to auscultation. Distal pulses are well felt.i Neurological Exam ;  Awake  Alert oriented x 3. Normal speech and language.eye movements full without nystagmus.fundi were not visualized. Vision acuity and fields appear normal. Hearing is normal. Palatal movements are normal. Face symmetric. Tongue midline. Normal strength, tone, reflexes and coordination. Normal sensation. Gait deferred.   ASSESSMENT/PLAN Mr. Max Sanchez is a 53 y.o. male with  history of DM2 presenting with confusion, dizziness, and trouble following directions.  Spanish speaking only.  The daughter states that he was taking some medicines (thinks this was for HTN) when he lived in [place] ~5-24yrs ago, but did not take his meds reliably and often stated that they "didn't work". He has not had much medical care since coming to the [place]. Per daughter the pt had c/o right side numbness when he went to bed around 11pm last night, but she nor the pt are able to tell me when that started. This has now gone away today since arrival to the hospital. Today he woke up confused and having trouble following directions. He did c/o headache and mild dizziness. Pts dtr says she has never seen him this way. CTH neg. MRI showed multiple tiny subcortical strokes bilaterally. No LVO, outside of tx window for IV tPA. He has been very hypertensive here. Labs show CRT 3.9, unable to do CTA. No prev history of renal failure. Will ck CUS/MRA head and Echo.   Stroke:  Scattered numerous punctate infarcts within both cerebral hemispheres due to small vessel disease.   CT head No acute intracranial abnormality. 2. Generalized age-related cerebral atrophy with mild chronic small vessel ischemic disease.   MRI  Numerous punctate acute infarctions scattered within both cerebral hemispheres consistent with micro embolic infarctions from the heart or ascending aorta. No large or confluent infarction.  Background pattern of chronic small vessel ischemic change. 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  MRA  Multiple intracranial findings that may benefit from CT angiographic evaluation. Possible acute or subacute occlusion of the A1 segment on the right. Question aneurysm of the anterior communicating artery region measuring up to 3 mm in size. Narrowing and irregularity of the M1 segment on the right, estimated severity about 20%. Abnormal appearance of the proximal basilar artery  raising the possibility of a localized dissection. 50% stenosis in region. 50% stenosis of the P2 segment on the right  2D Echo  Normal LV size with mild LV hypertrophy. EF 55-60%. Normal RV   size and systolic function. No significant valvular   abnormalities.  LDL 255  HgbA1c 7.3  Heparin subQ  Every 8 hours for VTE prophylaxis Diet Order            Diet heart healthy/carb modified Room service appropriate? Yes; Fluid consistency: Thin  Diet effective now               No antithrombotic prior to admission, now on aspirin 81 mg daily and clopidogrel 75 mg daily.  Continue Aspirin and Plavix for 3 weeks, and then discontinue plavix and continue Aspirin.  Therapy recommendations:  No follow up  Disposition:  pending  Hypertension  UnStable . Permissive hypertension (OK if < 220/120) but gradually normalize in 5-7 days . Long-term BP goal normotensive  Hyperlipidemia  Home meds:  none, none resumed in hospital  LDL 255 goal < 70  Add atorvastatin 40 mg daily  Continue statin at discharge  Diabetes type II  HgbA1c 7.3, goal < 7.0  Uncontrolled  Other Stroke Risk Factors  ETOH use, advised to drink no more than 2 drink(s) a day  Obesity, Body mass index is 29.64 kg/m., recommend weight loss, diet and exercise as appropriate   Cigarette usage, recommend to continue smoking cessation    Other Active Problems   Laurey Morale, MSN, NP-C [facility] day # 1 I have personally obtained history,examined this patient, reviewed notes, independently viewed imaging studies, participated in medical decision making and plan of care.ROS completed by me personally and pertinent positives fully documented  I have made any additions or clarifications directly to the above note. Agree with note above.  He presented with transient confusion and numbness and MRI shows multiple tiny white matter by cerebral infarcts likely from small  vessel disease and he has multiple uncontrolled risk factors. Recommend aspirin and Plavix for 3 weeks followed by aspirin alone and aggressive risk factor modification. Long discussion with patient as well as his care team regarding his treatment and evaluation plan and answered questions. Greater than 50% time during this 35 minute visit was spent on counseling and coordination of care about his lacunar infarcts and discussion of stroke prevention and treatment.  Antony Contras, MD Medical Director [facility] Pager: [phone] 03/11/2018 4:40 PM     To contact Stroke Continuity provider, please refer to [email or web address] After hours, contact General Neurology

## 2018-03-11 NOTE — Progress Notes (Signed)
Subjective: No acute events overnight.  Patient was eating breakfast when seen this morning and doing well.  He had no acute complaints.  He was accompanied by 1 of his daughters.   Mr. Bufford explained that he developed confusion, right upper extremity numbness and right lower extremity weakness suddenly when taking a shower last night.  After getting out of the shower he was unable to recognize his family or talk to them which prompted a visit to the ED.  He has been taking telmisartan and glinorboral at home which he gets from [place]. He states he has been on several medications before, which he self stopped because he did not feel well when taking them.  He has a PCP at [facility] but currently lives in [place].  Reports it is difficult to get to his appointments as he is unable to drive due to vision problems (left eye and diplopia on the right)  On review of chart he was seen by his PCP (Dr. Amparo Bristol) on [phone]. At that time, he was on HCTZ 25, spironolactone 50mg  BID, lisinopril 20 mg (decreased from 40 mg due to AKI), and was started on Croeg 3.25 mg BID. There is also mention about not restarting amlodipine 5 mg QD (10 mg initially prescribed but decreased to 5mg  due to peripheral edema, however patient never took the 5mg  dose).  Regarding his diabetes, his metformin was decreased to 1 g daily due to his kidney function and he was continued on glipizide.  He was also recommended to start Lantus 10 units but it does not appear he started this. Physician also attempted to start a GLP-1, but patient was unable to afford it.   Objective:  Vital signs in last 24 hours: Vitals:   03/11/18 0323 03/11/18 0620 03/11/18 0729 03/11/18 1138  BP: (!) 168/85 (!) 177/91 111/61 (!) 166/83  Pulse: (!) 105  73 78  Resp: 18  15 15   Temp: 98 F (36.7 C)   98.4 F (36.9 C)  TempSrc: Oral   Oral  SpO2: 98%   100%  Weight:      Height:       General: pleasant male, appears older  than stated age, sitting up in bed eating, in no acute distress  Neuro: alert and oriented, CN II-XII intact, strength and sensation intact in all 4 extremities  Ext: 1+ pitting edema bilaterally  Assessment/Plan:  Principal Problem:   Acute CVA (cerebrovascular accident) ([place]) Active Problems:   Hypertension   CKD (chronic kidney disease), stage III (HCC)   Acute-on-chronic kidney injury ([place])   Proteinuria   Hematuria   Prolonged Q-T interval on ECG   Hypertensive emergency  # Acute CVA 2/2 hypertensive emergency: Patient presented with acute metabolic encephalopathy, RUE numbness, RLE weakness, and expressive aphasia and was found to have elevated BP in the 200s. MRI showed multiple punctate subcortical infarcts in bilateral cerebral hemispheres. 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  He is doing well this AM and has no residual deficits on exam.  We will continue management as below. - Neurology following, appreciate recommendations  - Continue ASA 81 mf and atorvastatin 40 mg daily  - Continue Plavix 75 mg x 3 weeks (end date 03/31/2018) - PT and OT recommending no follow up   # HTN: Patient has  been on multiple agents in the past. Currently taking telmisartan at home which he gets shipped from [place]. Unfortunately, treatment has been complicated by poor follow up and non-compliance. Previous regimen included HCTZ, spiro and lisinopril.  - Start labetalol 100 mg BID.  Will follow BP closely given monitor BP reaction to IV hydralazine. - STOP HCTZ and spiro in the setting of CKD with GFR <30 - Amlodipine has caused peripheral edema in the past.  Can consider restarting at 2.5 mg daily. -Patient reported "feeling bad" after initiating his blood pressure medications.  Patient is this may be due to Coreg which is  the most recent medication that was added to his regimen in April.  Will not resume at this time.  # AKI on CKD IIIA: in the setting of HTN emergency. Baseline Cr ~2. Renal US unremarkable. Does have peripheral edema on exam but does not appear volume overload otherwise.  - Antihypertensive regimen as above - Will continue to monitor   # T2DM: A1c 7.3. On combination pill Glinorboral (metformin/glyburide) that he has shipped from [place]. - Continue SSI-S  - Unable to do a SGLT 2 inhibitor due to his renal function  - Could benefit from GLP-1 agonist is able to afford  - Could add glipizide 5 mg BID on discharge    Dispo: Anticipated discharge in approximately today-1 day(s) pending improvement in BP and response to anti-hypertensive medications.   Welford Roche, MD 03/11/2018, 3:53 PM Pager: [phone]

## 2018-03-11 NOTE — Progress Notes (Signed)
Carotid duplex exam completed. Please see preliminary notes on CV PROC under chart review. Irais Mottram H Baylei Siebels(RDMS RVT) 03/11/18 1:03 PM

## 2018-03-12 ENCOUNTER — Telehealth: Payer: Self-pay

## 2018-03-12 DIAGNOSIS — Z7982 Long term (current) use of aspirin: Secondary | ICD-10-CM

## 2018-03-12 DIAGNOSIS — Z7902 Long term (current) use of antithrombotics/antiplatelets: Secondary | ICD-10-CM

## 2018-03-12 LAB — BASIC METABOLIC PANEL
Anion gap: 9 (ref 5–15)
BUN: 39 mg/dL — ABNORMAL HIGH (ref 6–20)
CO2: 23 mmol/L (ref 22–32)
Calcium: 8.1 mg/dL — ABNORMAL LOW (ref 8.9–10.3)
Chloride: 102 mmol/L (ref 98–111)
Creatinine, Ser: 4.04 mg/dL — ABNORMAL HIGH (ref 0.61–1.24)
GFR calc Af Amer: 19 mL/min — ABNORMAL LOW (ref >60)
GFR, EST NON AFRICAN AMERICAN: 16 mL/min — AB (ref >60)
Glucose, Bld: 148 mg/dL — ABNORMAL HIGH (ref 70–99)
Potassium: 4.2 mmol/L (ref 3.5–5.1)
Sodium: 134 mmol/L — ABNORMAL LOW (ref 135–145)

## 2018-03-12 LAB — GLUCOSE, CAPILLARY
Glucose-Capillary: 141 mg/dL — ABNORMAL HIGH (ref 70–99)
Glucose-Capillary: 187 mg/dL — ABNORMAL HIGH (ref 70–99)

## 2018-03-12 LAB — CBC
HCT: 30.3 % — ABNORMAL LOW (ref 39.0–52.0)
HEMOGLOBIN: 10.2 g/dL — AB (ref 13.0–17.0)
MCH: 27.9 pg (ref 26.0–34.0)
MCHC: 33.7 g/dL (ref 30.0–36.0)
MCV: 82.8 fL (ref 80.0–100.0)
Platelets: 241 10*3/uL (ref 150–400)
RBC: 3.66 MIL/uL — ABNORMAL LOW (ref 4.22–5.81)
RDW: 12.9 % (ref 11.5–15.5)
WBC: 5.6 10*3/uL (ref 4.0–10.5)
nRBC: 0 % (ref 0.0–0.2)

## 2018-03-12 MED ORDER — CLOPIDOGREL BISULFATE 75 MG PO TABS: 75.0000 mg | ORAL_TABLET | Freq: Every day | ORAL | 0 refills | Status: DC

## 2018-03-12 MED ORDER — ATORVASTATIN CALCIUM 40 MG PO TABS: 40.0000 mg | ORAL_TABLET | Freq: Every day | ORAL | 0 refills | Status: DC

## 2018-03-12 MED ORDER — AMLODIPINE BESYLATE 5 MG PO TABS: 5.0000 mg | ORAL_TABLET | Freq: Every day | ORAL | 0 refills | Status: DC

## 2018-03-12 MED ORDER — LABETALOL HCL 100 MG PO TABS: 100.0000 mg | ORAL_TABLET | Freq: Three times a day (TID) | ORAL | 0 refills | Status: DC

## 2018-03-12 MED ORDER — ASPIRIN 81 MG PO TBEC: 81.0000 mg | DELAYED_RELEASE_TABLET | Freq: Every day | ORAL | 0 refills | Status: DC

## 2018-03-12 MED ORDER — GLIPIZIDE ER 5 MG PO TB24: 5.0000 mg | ORAL_TABLET | Freq: Every day | ORAL | 0 refills | Status: DC

## 2018-03-12 NOTE — Care Management Note (Signed)
Case Management Note  Patient Details  Name: Max Sanchez MRN: [id number] Date of Birth: 1965-06-22  Subjective/Objective:   Pt admitted with a stroke. He is from home with his wife and daughter. DME; none No PCP and no insurance.                 Action/Plan: Pt has been picked up by [facility]. CM will cancel appt with Primary Care at [facility].  No f/u per PT/OT and no DME. Daughter and wife able to provide intermittent supervision at home.  CM provided pt with MATCH letter to assist with cost of medications.  Daughter to provide transport home.   Expected Discharge Date:  03/12/18               Expected Discharge Plan:  Home/Self Care  In-House Referral:     Discharge planning Services  CM Consult, [place] [facility], [facility]  Post Acute Care Choice:    Choice offered to:     DME Arranged:    DME Agency:     HH Arranged:    HH Agency:     Status of Service:  Completed, signed off  If discussed at [employer], dates discussed:    Additional Comments:  Pollie Friar, RN 03/12/2018, 3:14 PM

## 2018-03-12 NOTE — Progress Notes (Deleted)
Name: Max Sanchez MRN: [id number] DOB: 1965/09/30 53 y.o. PCP: System, Pcp Not In  Date of Admission: 03/10/2018  5:53 AM Date of Discharge: 03/12/2018 Attending Physician: Lucious Groves, DO  Discharge Diagnosis: 1. Acute CVA 2. Hypertensive emergency 3. CKD III 4. Type II DM   Discharge Medications: Allergies as of 03/12/2018   No Known Allergies     Medication List    STOP taking these medications   hydrochlorothiazide 25 MG tablet Commonly known as:  HYDRODIURIL   lisinopril 10 MG tablet Commonly known as:  PRINIVIL,ZESTRIL   metFORMIN 1000 MG tablet Commonly known as:  GLUCOPHAGE     TAKE these medications   amLODipine 5 MG tablet Commonly known as:  NORVASC Take 1 tablet (5 mg total) by mouth daily for 30 days.   aspirin 81 MG EC tablet Take 1 tablet (81 mg total) by mouth daily. IM program   atorvastatin 40 MG tablet Commonly known as:  LIPITOR Take 1 tablet (40 mg total) by mouth daily at 6 PM.   clopidogrel 75 MG tablet Commonly known as:  PLAVIX Take 1 tablet (75 mg total) by mouth daily.   glipiZIDE 5 MG 24 hr tablet Commonly known as:  GLUCOTROL XL Take 1 tablet (5 mg total) by mouth daily with breakfast.   labetalol 100 MG tablet Commonly known as:  NORMODYNE Take 1 tablet (100 mg total) by mouth 3 (three) times daily.       Disposition and follow-up:   Mr.Max Sanchez was discharged from [facility] in Good condition.  At the hospital follow up visit please address:  1.  Acute CVA secondary to hypertensive emergency: started on Aspirin and Plavix for total of 3 weeks (03/31/18), followed by aspirin alone. Also started on Atorvastatin 40 mg. Please evaluate for any recurrence of neurologic symptoms and ensure he is taking medications correctly.   HTN: Started on Labetalol 100 mg TID, as well as Amlodipine 5 mg (reports lower extremity edema on 10 mg dosing). Please recheck blood pressure and adjust regimen as  indicated.   CKD: Crt at discharge 4.0. Unclear what most recent baseline has been, but likely has worsened due to prolonged uncontrolled HTN. Please continue to monitor.   2.  Labs / imaging needed at time of follow-up: BMP  3.  Pending labs/ test needing follow-up: none  Follow-up Appointments: Follow-up Information    [name] Follow up.   Contact information: [address] [place] [facility] Course by problem list: 1. Acute CVA 2/2 hypertensive emergency: Patient presented with acute metabolic encephalopathy, RUE numbness, RLE weakness and expressive aphasia. Found to have systolic blood pressure in the 200s. MRI showed multiple punctate subcortical infarcts in bilateral cerebral hemisphere. His symptoms had resolved and patient felt back to his baseline by the time of admission. Neurology started him on ASA 81 and Plavix 75 mg for 3 weeks, followed by aspirin alone. He was also started on high intensity statin.   2. HTN: Per record review, patient had been on multiple agents in the past, but unfortunately was lost to follow-up. He stated that he discontinued all prescribed medications and was only been on telmisartan which he had shipped from [place]. Previous prescribed regimen included HCTZ, sprio and lisinopril. Due to impaired renal function, patient was started on Labetalol 100 mg BID which was titrated up to TID the following day. Blood  pressure remained elevated so Amlodipine 5 mg was added. He previously had side effect of lower extremity edema on 10 mg dosing. Patient will likely need additional medication adjustments outpatient.   3. AKI on CKD: in the setting of HTN emergency. Renal ultrasound was unremarkable. Crt at discharge was 4. Will need periodic monitoring. ACE/ARB therapy was not initiated at this time to hopefully allow for continued recovery in renal function as blood pressure becomes better  controlled.   4. Type II DM: A1C 7.3. Patient was on combination pill of Metformin and Glyburide (from [place]). Metformin was discontinued due to impaired renal function, and he was started on Glipizide 5 mg daily.   Patient was educated on importance of only taking prescribed medications that we have given him and to throw anything out he had gotten from [place]. Will need all meds to be on $4 list for IM program.    Discharge Vitals:   BP (!) 163/98 (BP Location: Left Arm)   Pulse 77   Temp 98.4 F (36.9 C) (Oral)   Resp 16   Ht 5\' 5"  (1.651 m)   Wt 80.8 kg   SpO2 99%   BMI 29.64 kg/m   Pertinent Labs, Studies, and Procedures:  BMP Latest Ref Rng & Units 03/12/2018 03/11/2018 03/10/2018  Glucose 70 - 99 mg/dL 148(H) 125(H) 193(H)  BUN 6 - 20 mg/dL 39(H) 35(H) 34(H)  Creatinine 0.61 - 1.24 mg/dL 4.04(H) 3.86(H) 3.90(H)  Sodium 135 - 145 mmol/L 134(L) 135 134(L)  Potassium 3.5 - 5.1 mmol/L 4.2 3.9 3.5  Chloride 98 - 111 mmol/L 102 103 103  CO2 22 - 32 mmol/L 23 21(L) -  Calcium 8.9 - 10.3 mg/dL 8.1(L) 8.3(L) -    Discharge Instructions: Discharge Instructions    Call MD for:  difficulty breathing, headache or visual disturbances   Complete by:  As directed    Call MD for:  extreme fatigue   Complete by:  As directed    Call MD for:  persistant dizziness or light-headedness   Complete by:  As directed    Diet - low sodium heart healthy   Complete by:  As directed    Diet Carb Modified   Complete by:  As directed    Discharge instructions   Complete by:  As directed    Senor Max Sanchez,  Lo admitimos al hospital debido a un infarto cerebeal que occurio por su alta presion. Deje de tomarse todos los medicamentos que se esta tomando en casa, estos pueden afectar sus rinones.   Para su presion, comience a tomar labetalol 100 mg tres veces al dia (1 tableta 3 veces al dia). Le haremos una cita de seguimiento en la clinica. Si su presion continua alta, le cambiaremos la dosis.   Para  su diabates, deje de tomarse lo que tiene en casa. La metfomrina le puede causar mas dano a los rinones. Comienze a tomar glipizide 1  Tablet dos veces al dia.   Para su infarto en el cerebro, tome aspirina 1 tableta diaria y atorvastatin 1 tablet diaria. Tambien va a tomar Plavix 1 tableta diaria por 3 semanas solamente.   Estos medicamentos deben costarle alrededor de $20 en total. Llamenos si tiene alguna pregunta. Le haremos una cita en nuestra clinica para la semana que viene.   - Dr. Frederico Hamman   Increase activity slowly   Complete by:  As directed       Signed: Delice Bison, DO 03/17/2018, 8:19 PM   Pager:  [phone]  

## 2018-03-12 NOTE — Plan of Care (Signed)
Pt d/c home with no complains or concerns pt is stable and on room air. D/c instructions done with teach back, pt verbalize understanding. Pt completed/met care plan desired goal. Pt is transported out of facility by family.

## 2018-03-12 NOTE — Progress Notes (Signed)
   Subjective: Max Sanchez was seen and evaluated on morning rounds. Accompanied by daughter at bedside. No acute events overnight. He is feeling well without any complaints of headache, numbness/tingling or weakness. We discussed at length the importance of taking the medications we will be prescribing for his stroke, as well as DM and HTN to decrease risk of future stroke. He was also instructed to throw out anything he was taking from [place] and only take what we prescribe him. He would like to establish care in our clinic to avoid having travel back and forth to [facility].   Objective:  Vital signs in last 24 hours: Vitals:   03/12/18 0000 03/12/18 0417 03/12/18 0836 03/12/18 1208  BP: (!) 165/92 114/68 (!) 170/93 (!) 163/98  Pulse: 75 72 82 77  Resp: 18 18 17 16   Temp: 98 F (36.7 C) 97.7 F (36.5 C) 98.1 F (36.7 C) 98.4 F (36.9 C)  TempSrc: Oral Oral Oral Oral  SpO2: 100% 100% 99% 99%  Weight:      Height:       General: awake, alert, sitting up in bed eating lunch  CV: RRR; no murmurs, rubs or gallops Pulm: normal respiratory effort; lungs CTA bilaterally Neuro: A&Ox3; CN II-XII intact, strength and sensation intact in bilateral upper and lower extremities   Assessment/Plan:  Principal Problem:   Acute CVA (cerebrovascular accident) ([place]) Active Problems:   Hypertension   CKD (chronic kidney disease), stage III (HCC)   Acute-on-chronic kidney injury (HCC)   Proteinuria   Prolonged Q-T interval on ECG   Hypertensive emergency  1. Acute CVA in the setting of hypertensive emergency:  - patient's neurologic status has remained at baseline since yesterday - he has been evaluated PT/OT and does not require any further therapy outpatient - he will continue DAPT for 3 weeks, followed by aspirin alone; appreciate neuro recs - continue atorvastatin 40 mg; should have lipid panel re-checked in 4-6 weeks to ensure he responding appropriately to therapy   2. HTN - presented  with systolic blood pressure >546; has improved since admission but still remaining in 160s-170s  - he was started on Labetalol 100 mg BID; will increase to TID  - also start Amlodipine 5 mg; he reports peripheral edema with 10 mg dose  - can have medications titrated as needed at follow-up appointment next week   3. AKI on CKD III: in the setting of hypertensive emergency  - creatinine has remained ~4 which may be progression of kidney disease due to uncontrolled HTN  - managing HTN as above and will continue to monitor outpatient   4. DM type II:  - A1C 7.3; have discontinued his combination pill which contained Metformin due to renal function - starting him on Glipizide 5 mg daily at discharge   Dispo: Anticipated discharge home today.    Modena Nunnery D, DO 03/12/2018, 2:20 PM Pager: [phone]

## 2018-03-12 NOTE — Progress Notes (Signed)
STROKE TEAM PROGRESS NOTE   INTERVAL HISTORY His daughter who speaks english and translates  is at the bedside.  Patient awake, sitting on side of bed, No complaints and wants to go home. Carotid Dopplers and 2-D echo are unremarkable.  Vitals:   03/12/18 0000 03/12/18 0417 03/12/18 0836 03/12/18 1208  BP: (!) 165/92 114/68 (!) 170/93 (!) 163/98  Pulse: 75 72 82 77  Resp: 18 18 17 16   Temp: 98 F (36.7 C) 97.7 F (36.5 C) 98.1 F (36.7 C) 98.4 F (36.9 C)  TempSrc: Oral Oral Oral Oral  SpO2: 100% 100% 99% 99%  Weight:      Height:        CBC:  Recent Labs  Lab 03/10/18 0618  03/11/18 0518 03/12/18 0649  WBC 10.2  --  7.3 5.6  NEUTROABS 8.2*  --   --   --   HGB 11.4*   < > 10.7* 10.2*  HCT 35.1*   < > 33.3* 30.3*  MCV 82.4  --  82.4 82.8  PLT 265  --  255 241   < > = values in this interval not displayed.    Basic Metabolic Panel:  Recent Labs  Lab 03/11/18 0518 03/12/18 0649  NA 135 134*  K 3.9 4.2  CL 103 102  CO2 21* 23  GLUCOSE 125* 148*  BUN 35* 39*  CREATININE 3.86* 4.04*  CALCIUM 8.3* 8.1*   Lipid Panel:     Component Value Date/Time   CHOL 359 (H) 03/10/2018 0618   TRIG 312 (H) 03/10/2018 0618   HDL 42 03/10/2018 0618   CHOLHDL 8.5 03/10/2018 0618   VLDL 62 (H) 03/10/2018 0618   LDLCALC 255 (H) 03/10/2018 0618   HgbA1c:  Lab Results  Component Value Date   HGBA1C 7.3 (H) 03/10/2018   Urine Drug Screen:     Component Value Date/Time   LABOPIA NONE DETECTED 03/10/2018 0612   COCAINSCRNUR NONE DETECTED 03/10/2018 0612   LABBENZ NONE DETECTED 03/10/2018 0612   AMPHETMU NONE DETECTED 03/10/2018 0612   THCU NONE DETECTED 03/10/2018 0612   LABBARB NONE DETECTED 03/10/2018 0612    Alcohol Level     Component Value Date/Time   ETH <10 03/10/2018 2409    IMAGING Vas US Carotid  Result Date: 03/11/2018 Carotid Arterial Duplex Study Indications:       CVA and Weakness. Comparison Study:  No prior study available. Performing Technologist:  Rudell Cobb  Examination Guidelines: A complete evaluation includes B-mode imaging, spectral Doppler, color Doppler, and power Doppler as needed of all accessible portions of each vessel. Bilateral testing is considered an integral part of a complete examination. Limited examinations for reoccurring indications may be performed as noted.  Right Carotid Findings: +----------+--------+--------+--------+-----------------------+----------------+           PSV cm/sEDV cm/sStenosisDescribe               Comments         +----------+--------+--------+--------+-----------------------+----------------+ CCA Prox  107     25              diffuse and hyperechoicair interference +----------+--------+--------+--------+-----------------------+----------------+ CCA Distal72      15              diffuse and hyperechoic                 +----------+--------+--------+--------+-----------------------+----------------+ ICA Prox  58      15      1-39%  tortuous         +----------+--------+--------+--------+-----------------------+----------------+ ICA Distal90      28                                                      +----------+--------+--------+--------+-----------------------+----------------+ ECA       85                                                              +----------+--------+--------+--------+-----------------------+----------------+ +----------+--------+-------+--------+-------------------+           PSV cm/sEDV cmsDescribeArm Pressure (mmHG) +----------+--------+-------+--------+-------------------+ [id number]                                         +----------+--------+-------+--------+-------------------+ +---------+--------+--+--------+--+---------+ VertebralPSV cm/s36EDV cm/s12Antegrade +---------+--------+--+--------+--+---------+  Left Carotid Findings: +----------+--------+--------+--------+-----------------------+--------+            PSV cm/sEDV cm/sStenosisDescribe               Comments +----------+--------+--------+--------+-----------------------+--------+ CCA Prox  107     24                                              +----------+--------+--------+--------+-----------------------+--------+ CCA Distal79      26              hyperechoic and diffuse         +----------+--------+--------+--------+-----------------------+--------+ ICA Prox  119     48      40-59%  diffuse and hyperechoictortuous +----------+--------+--------+--------+-----------------------+--------+ ICA Mid   124     32                                              +----------+--------+--------+--------+-----------------------+--------+ ICA Distal79      26                                              +----------+--------+--------+--------+-----------------------+--------+ ECA       85      10                                              +----------+--------+--------+--------+-----------------------+--------+ +----------+--------+--------+--------+-------------------+ SubclavianPSV cm/sEDV cm/sDescribeArm Pressure (mmHG) +----------+--------+--------+--------+-------------------+           131                                         +----------+--------+--------+--------+-------------------+ +---------+--------+--+--------+--+---------+ VertebralPSV cm/s40EDV cm/s15Antegrade +---------+--------+--+--------+--+---------+  Summary: Right Carotid: Velocities in the right ICA are consistent with a 1-39% stenosis. Left Carotid:  Velocities in the left ICA are consistent with a 40-59% stenosis. Vertebrals: Bilateral vertebral arteries demonstrate antegrade flow. *See table(s) above for measurements and observations.  Electronically signed by Antony Contras MD on 03/11/2018 at 4:21:51 PM.    Final     PHYSICAL EXAM : obese middle aged 39 male not in distress. . Afebrile. Head is nontraumatic. Neck is  supple without bruit.    Cardiac exam no murmur or gallop. Lungs are clear to auscultation. Distal pulses are well felt.i Neurological Exam ;  Awake  Alert oriented x 3. Normal speech and language.eye movements full without nystagmus.fundi were not visualized. Vision acuity and fields appear normal. Hearing is normal. Palatal movements are normal. Face symmetric. Tongue midline. Normal strength, tone, reflexes and coordination. Normal sensation. Gait deferred.   ASSESSMENT/PLAN Mr. Max Sanchez is a 53 y.o. male with history of DM2 presenting with confusion, dizziness, and trouble following directions.  Spanish speaking only.  The daughter states that he was taking some medicines (thinks this was for HTN) when he lived in [place] ~5-41yrs ago, but did not take his meds reliably and often stated that they "didn't work". He has not had much medical care since coming to the [place]. Per daughter the pt had c/o right side numbness when he went to bed around 11pm last night, but she nor the pt are able to tell me when that started. This has now gone away today since arrival to the hospital. Today he woke up confused and having trouble following directions. He did c/o headache and mild dizziness. Pts dtr says she has never seen him this way. CTH neg. MRI showed multiple tiny subcortical strokes bilaterally. No LVO, outside of tx window for IV tPA. He has been very hypertensive here. Labs show CRT 3.9, unable to do CTA. No prev history of renal failure. Will ck CUS/MRA head and Echo.   Stroke:  Scattered numerous punctate infarcts within both cerebral hemispheres due to small vessel disease.   CT head No acute intracranial abnormality. 2. Generalized age-related cerebral atrophy with mild chronic small vessel ischemic disease.   MRI  Numerous punctate acute infarctions scattered within both cerebral hemispheres consistent with micro embolic infarctions from the heart or ascending aorta. No large or confluent  infarction.  Background pattern of chronic small vessel ischemic change. 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  MRA  Multiple intracranial findings that may benefit from CT angiographic evaluation. Possible acute or subacute occlusion of the A1 segment on the right. Question aneurysm of the anterior communicating artery region measuring up to 3 mm in size. Narrowing and irregularity of the M1 segment on the right, estimated severity about 20%. Abnormal appearance of the proximal basilar artery raising the possibility of a localized dissection. 50% stenosis in region. 50% stenosis of the P2 segment on the right  2D Echo  Normal LV size with mild LV hypertrophy. EF 55-60%. Normal RV   size and systolic function. No significant valvular   abnormalities.  LDL 255  HgbA1c 7.3  Heparin subQ  Every 8 hours for VTE prophylaxis Diet Order            Diet - low sodium heart healthy        Diet Carb Modified        Diet renal/carb modified with fluid restriction Diet-HS Snack? Nothing; Room service appropriate? Yes; Fluid consistency: Thin  Diet effective now              No  antithrombotic prior to admission, now on aspirin 81 mg daily and clopidogrel 75 mg daily.  Continue Aspirin and Plavix for 3 weeks, and then discontinue plavix and continue Aspirin.  Therapy recommendations:  No follow up Disposition: home Hypertension  UnStable . Permissive hypertension (OK if < 220/120) but gradually normalize in 5-7 days . Long-term BP goal normotensive  Hyperlipidemia  Home meds:  none, none resumed in hospital  LDL 255 goal < 70  Add atorvastatin 40 mg daily  Continue statin at discharge  Diabetes type II  HgbA1c 7.3, goal < 7.0  Uncontrolled  Other Stroke Risk Factors  ETOH use, advised to drink no more than 2 drink(s) a day  Obesity, Body mass index is 29.64 kg/m., recommend weight loss, diet and exercise as appropriate   Cigarette usage,  recommend to continue smoking cessation    Other Active Problems    .  He presented with transient confusion and numbness and MRI shows multiple tiny white matter by cerebral infarcts likely from small vessel disease and he has multiple uncontrolled risk factors. Recommend aspirin and Plavix for 3 weeks followed by aspirin alone and aggressive risk factor modification. Long discussion with patient as well as daughter his care team regarding his treatment and evaluation plan and answered questions. Greater than 50% time during this 25 minute visit was spent on counseling and coordination of care about his lacunar infarcts and discussion of stroke prevention and treatment. Stroke team will sign off. Follow-up as an outpatient in stroke clinic in 6 weeks. Antony Contras, MD Medical Director [facility] Pager: [phone] 03/12/2018 3:01 PM     To contact Stroke Continuity provider, please refer to [email or web address] After hours, contact General Neurology

## 2018-03-12 NOTE — Progress Notes (Signed)
Occupational Therapy Treatment Patient Details Name: Max Sanchez MRN: [id number] DOB: 04-11-1965 Today's Date: 03/12/2018    History of present illness Pt is a 53 y/o male admitted secondary to confusion. MRI revealed numerous punctate acute infarcts. PMH including but not limited to DM   OT comments  Pt presents sitting EOB, pleasant and willing to participate in therapy session today. Pt completing functional mobility without AD and overall supervision-minguard assist during session. Pt participating in higher level cognitive challenges during mobility, intermittently requires increased time to perform; required increased time, min cues and use of signs in hallway for pathfinding task to return to room end of session, though given appropriate cues was able to do so. Feel POC remains appropriate at this time. Pt anticipating d/c home today.    Follow Up Recommendations  No OT follow up;Supervision - Intermittent    Equipment Recommendations  None recommended by OT          Precautions / Restrictions Precautions Precautions: None Restrictions Weight Bearing Restrictions: No       Mobility Bed Mobility               General bed mobility comments: sitting EOB upon arrival  Transfers Overall transfer level: Modified independent Equipment used: None                  Balance Overall balance assessment: Needs assistance Sitting-balance support: Feet supported Sitting balance-Leahy Scale: Good     Standing balance support: No upper extremity supported Standing balance-Leahy Scale: Good                             ADL either performed or assessed with clinical judgement   ADL Overall ADL's : Modified independent                                       General ADL Comments: pt doffing socks, donning one sock as pt's daughter initially attempting to help, encouraged pt to perform on his own and he was able to do so, doning second  gown in standing with setup assist only. pt completing room and hallway level mobility without AD and overall minguard-supervision assist - challenged higher level cognition including having pt count backwards from 100, recall and follow multi-step commands, and pathfinding to return to room; pt with mild difficulty when coutning backwards; requiring increased time, min cues and use of signs in hallways to assist with locating room      Vision       Perception     Praxis      Cognition Arousal/Alertness: Awake/alert Behavior During Therapy: [facility] for tasks assessed/performed Overall Cognitive Status: Within Functional Limits for tasks assessed                                 General Comments: mild difficulty with higher level cognitive challenges, but overall appropriate        Exercises     Shoulder Instructions       General Comments pt daughter present and assisting to interpret during session    Pertinent Vitals/ Pain       Pain Assessment: No/denies pain  Home Living  Prior Functioning/Environment              Frequency  Min 2X/week        Progress Toward Goals  OT Goals(current goals can now be found in the care plan section)  Progress towards OT goals: Progressing toward goals  Acute Rehab OT Goals Patient Stated Goal: return home  Plan Discharge plan remains appropriate    Co-evaluation                 AM-PAC OT "6 Clicks" Daily Activity     Outcome Measure   Help from another person eating meals?: None Help from another person taking care of personal grooming?: None Help from another person toileting, which includes using toliet, bedpan, or urinal?: None Help from another person bathing (including washing, rinsing, drying)?: None Help from another person to put on and taking off regular upper body clothing?: None Help from another person to put on and taking off  regular lower body clothing?: None 6 Click Score: 24    End of Session Equipment Utilized During Treatment: Gait belt  OT Visit Diagnosis: Unsteadiness on feet (R26.81);Other symptoms and signs involving cognitive function   Activity Tolerance Patient tolerated treatment well   Patient Left with call bell/phone within reach;with family/visitor present(sitting EOB)   Nurse Communication Mobility status        Time: 5462-7035 OT Time Calculation (min): 15 min  Charges: OT General Charges $OT Visit: 1 Visit OT Treatments $Therapeutic Activity: 8-22 mins  [name], OT [facility] Services Pager [phone] Office [phone]    [name] 03/12/2018, 4:51 PM

## 2018-03-12 NOTE — Telephone Encounter (Signed)
Hospital TOC per Dr Alfonse Spruce, discharge 1/10, appt 1/17.

## 2018-03-17 NOTE — Telephone Encounter (Signed)
No answer when called 

## 2018-03-19 ENCOUNTER — Other Ambulatory Visit: Payer: Self-pay

## 2018-03-19 ENCOUNTER — Encounter: Payer: Self-pay | Admitting: Internal Medicine

## 2018-03-19 ENCOUNTER — Ambulatory Visit (INDEPENDENT_AMBULATORY_CARE_PROVIDER_SITE_OTHER): Payer: Self-pay | Admitting: Internal Medicine

## 2018-03-19 VITALS — BP 167/89 | HR 77 | Temp 97.8°F | Ht 65.0 in | Wt 175.9 lb

## 2018-03-19 DIAGNOSIS — E785 Hyperlipidemia, unspecified: Secondary | ICD-10-CM

## 2018-03-19 DIAGNOSIS — Z7984 Long term (current) use of oral hypoglycemic drugs: Secondary | ICD-10-CM

## 2018-03-19 DIAGNOSIS — G4700 Insomnia, unspecified: Secondary | ICD-10-CM

## 2018-03-19 DIAGNOSIS — N183 Chronic kidney disease, stage 3 (moderate): Secondary | ICD-10-CM

## 2018-03-19 DIAGNOSIS — N179 Acute kidney failure, unspecified: Secondary | ICD-10-CM

## 2018-03-19 DIAGNOSIS — E782 Mixed hyperlipidemia: Secondary | ICD-10-CM

## 2018-03-19 DIAGNOSIS — F5102 Adjustment insomnia: Secondary | ICD-10-CM

## 2018-03-19 DIAGNOSIS — Z7902 Long term (current) use of antithrombotics/antiplatelets: Secondary | ICD-10-CM

## 2018-03-19 DIAGNOSIS — Z7982 Long term (current) use of aspirin: Secondary | ICD-10-CM

## 2018-03-19 DIAGNOSIS — I10 Essential (primary) hypertension: Secondary | ICD-10-CM

## 2018-03-19 DIAGNOSIS — Z79899 Other long term (current) drug therapy: Secondary | ICD-10-CM

## 2018-03-19 DIAGNOSIS — E1122 Type 2 diabetes mellitus with diabetic chronic kidney disease: Secondary | ICD-10-CM

## 2018-03-19 DIAGNOSIS — I129 Hypertensive chronic kidney disease with stage 1 through stage 4 chronic kidney disease, or unspecified chronic kidney disease: Secondary | ICD-10-CM

## 2018-03-19 DIAGNOSIS — Z8673 Personal history of transient ischemic attack (TIA), and cerebral infarction without residual deficits: Secondary | ICD-10-CM

## 2018-03-19 HISTORY — DX: Hyperlipidemia, unspecified: E78.5

## 2018-03-19 MED ORDER — LABETALOL HCL 200 MG PO TABS: 200.0000 mg | ORAL_TABLET | Freq: Three times a day (TID) | ORAL | 0 refills | Status: DC

## 2018-03-19 NOTE — Assessment & Plan Note (Signed)
LDL 255 at recent check during hospitalization for [facility] emergency with CVA.  Plan: --continue lipitor 40mg  daily --f/u in 6-8 weeks for repeat lipid panel to assess affect

## 2018-03-19 NOTE — Assessment & Plan Note (Addendum)
Patient with hospitalization for hypertensive emergency with multiple, bilateral, punctate brain infarcts. He denies neuro symptoms other than intermittent tingling of fingers mainly in the right hand.   Plan: --continue asa and plavix; stop plavix after 1/29 --continue lipitor 40mg  daily; check lipids in 6-8wks --work on BP control as noted above

## 2018-03-19 NOTE — Assessment & Plan Note (Signed)
Patient with previous CKD 3 with Cr at the time ~2, last noted March 2019. During hospitalization Cr had increased to ~4 with GFR of 16. Unclear if chronic progression vs AKI in setting of hypertensive emergency.   Plan: --repeat bmet at f/u visit to assess renal function

## 2018-03-19 NOTE — Assessment & Plan Note (Signed)
Patient with new onset of delayed sleep onset and frequent night awakenings since hospitalization.   Plan: --advised to try melatonin --assess symptoms at f/u, and consider other signs of anxiety or depression post-stroke

## 2018-03-19 NOTE — Patient Instructions (Addendum)
Continue taking aspirin and plavix. After January 29th, take only aspirin.  Continue taking lipitor for your cholesterol.  Continue taking glipizide 5mg  daily for your diabetes.  Continue amlodipine 5mg  daily Increase labetalol to 200mg  three times a day.  Come back in about 2 weeks and we will check your blood pressure and your kidney function.   You can try 1mg  of over the counter melatonin for your sleep.

## 2018-03-19 NOTE — Discharge Summary (Addendum)
Name: Max Sanchez MRN: [id number] DOB: 1966-02-23 53 y.o. PCP: System, Pcp Not In  Date of Admission: 03/10/2018  5:53 AM Date of Discharge: 03/12/2018 Attending Physician: Lucious Groves, DO  Discharge Diagnosis: 1. Acute CVA 2. Hypertensive emergency 3. CKD III 4. Type II DM   Discharge Medications: Allergies as of 03/12/2018   No Known Allergies     Medication List    STOP taking these medications   hydrochlorothiazide 25 MG tablet Commonly known as:  HYDRODIURIL   lisinopril 10 MG tablet Commonly known as:  PRINIVIL,ZESTRIL   metFORMIN 1000 MG tablet Commonly known as:  GLUCOPHAGE     TAKE these medications   amLODipine 5 MG tablet Commonly known as:  NORVASC Take 1 tablet (5 mg total) by mouth daily for 30 days.   aspirin 81 MG EC tablet Take 1 tablet (81 mg total) by mouth daily. IM program   atorvastatin 40 MG tablet Commonly known as:  LIPITOR Take 1 tablet (40 mg total) by mouth daily at 6 PM.   clopidogrel 75 MG tablet Commonly known as:  PLAVIX Take 1 tablet (75 mg total) by mouth daily.   glipiZIDE 5 MG 24 hr tablet Commonly known as:  GLUCOTROL XL Take 1 tablet (5 mg total) by mouth daily with breakfast.   labetalol 100 MG tablet Commonly known as:  NORMODYNE Take 1 tablet (100 mg total) by mouth 3 (three) times daily.       Disposition and follow-up:   Max Sanchez was discharged from [facility] in Good condition.  At the hospital follow up visit please address:  1.  Acute CVA secondary to hypertensive emergency: started on Aspirin and Plavix for total of 3 weeks (03/31/18), followed by aspirin alone. Also started on Atorvastatin 40 mg. Please evaluate for any recurrence of neurologic symptoms and ensure he is taking medications correctly.   HTN: Started on Labetalol 100 mg TID, as well as Amlodipine 5 mg (reports lower extremity edema on 10 mg dosing). Please recheck blood pressure and adjust regimen as  indicated.   CKD: Crt at discharge 4.0. Unclear what most recent baseline has been, but likely has worsened due to prolonged uncontrolled HTN. Please continue to monitor.   2.  Labs / imaging needed at time of follow-up: BMP  3.  Pending labs/ test needing follow-up: none  Follow-up Appointments: Follow-up Information    [facility] Follow up.   Contact information: [address] [place] [facility] Course by problem list: 1. Acute CVA 2/2 hypertensive emergency: Patient presented with acute metabolic encephalopathy, RUE numbness, RLE weakness and expressive aphasia. Found to have systolic blood pressure in the 200s. MRI showed multiple punctate subcortical infarcts in bilateral cerebral hemisphere. His symptoms had resolved and patient felt back to his baseline by the time of admission. Neurology started him on ASA 81 and Plavix 75 mg for 3 weeks, followed by aspirin alone. He was also started on high intensity statin.   2. HTN: Per record review, patient had been on multiple agents in the past, but unfortunately was lost to follow-up. He stated that he discontinued all prescribed medications and was only been on telmisartan which he had shipped from [place]. Previous prescribed regimen included HCTZ, sprio and lisinopril. Due to impaired renal function, patient was started on Labetalol 100 mg BID which was titrated up to TID the following day. Blood  pressure remained elevated so Amlodipine 5 mg was added. He previously had side effect of lower extremity edema on 10 mg dosing. Patient will likely need additional medication adjustments outpatient.   3. AKI on CKD: in the setting of HTN emergency. Renal ultrasound was unremarkable. Crt at discharge was 4. Will need periodic monitoring. ACE/ARB therapy was not initiated at this time to hopefully allow for continued recovery in renal function as blood pressure becomes better  controlled.   4. Type II DM: A1C 7.3. Patient was on combination pill of Metformin and Glyburide (from [place]). Metformin was discontinued due to impaired renal function, and he was started on Glipizide 5 mg daily.   Patient was educated on importance of only taking prescribed medications that we have given him and to throw anything out he had gotten from [place]. Will need all meds to be on $4 list for IM program.    Discharge Vitals:   BP (!) 163/98 (BP Location: Left Arm)   Pulse 77   Temp 98.4 F (36.9 C) (Oral)   Resp 16   Ht 5\' 5"  (1.651 m)   Wt 80.8 kg   SpO2 99%   BMI 29.64 kg/m   Pertinent Labs, Studies, and Procedures:  BMP Latest Ref Rng & Units 03/12/2018 03/11/2018 03/10/2018  Glucose 70 - 99 mg/dL 148(H) 125(H) 193(H)  BUN 6 - 20 mg/dL 39(H) 35(H) 34(H)  Creatinine 0.61 - 1.24 mg/dL 4.04(H) 3.86(H) 3.90(H)  Sodium 135 - 145 mmol/L 134(L) 135 134(L)  Potassium 3.5 - 5.1 mmol/L 4.2 3.9 3.5  Chloride 98 - 111 mmol/L 102 103 103  CO2 22 - 32 mmol/L 23 21(L) -  Calcium 8.9 - 10.3 mg/dL 8.1(L) 8.3(L) -    Discharge Instructions: Discharge Instructions    Call MD for:  difficulty breathing, headache or visual disturbances   Complete by:  As directed    Call MD for:  extreme fatigue   Complete by:  As directed    Call MD for:  persistant dizziness or light-headedness   Complete by:  As directed    Diet - low sodium heart healthy   Complete by:  As directed    Diet Carb Modified   Complete by:  As directed    Discharge instructions   Complete by:  As directed    [name],  Lo admitimos al hospital debido a un infarto cerebeal que occurio por su alta presion. Deje de tomarse todos los medicamentos que se esta tomando en casa, estos pueden afectar sus rinones.   Para su presion, comience a tomar labetalol 100 mg tres veces al dia (1 tableta 3 veces al dia). Le haremos una cita de seguimiento en la clinica. Si su presion continua alta, le cambiaremos la dosis.   Para  su diabates, deje de tomarse lo que tiene en casa. La metfomrina le puede causar mas dano a los rinones. Comienze a tomar glipizide 1  Tablet dos veces al dia.   Para su infarto en el cerebro, tome aspirina 1 tableta diaria y atorvastatin 1 tablet diaria. Tambien va a tomar Plavix 1 tableta diaria por 3 semanas solamente.   Estos medicamentos deben costarle alrededor de $20 en total. Llamenos si tiene alguna pregunta. Le haremos una cita en nuestra clinica para la semana que viene.   - Dr. Frederico Hamman   Increase activity slowly   Complete by:  As directed       Signed: Delice Bison, DO 03/17/2018, 8:19 PM   Pager:  [phone]   

## 2018-03-19 NOTE — Assessment & Plan Note (Signed)
Still uncontrolled, asymptomatic.  Plan: --increase labetalol to 200mg  TID --continue amlodipine 5mg  daily --f/u in 2 weeks for BP check and Bmet

## 2018-03-19 NOTE — Progress Notes (Signed)
   CC: CVA  HPI:  Mr.Selig Oldaker is a 53 y.o. with a PMH of hypertensive emergency   Patient admitted 1/8-1/12/2018 with right sided numbness, confusion, and headache found to have [facility] emergency with numerous, bilateral, punctate acute infarcts. He has been started on DAPT x3 weeks after which time he will remain only on asa 81mg  daily. He has also been started on lipitor 40mg  daily.  For his HTN, labetalol 100mg  TID and amlodipine 5mg  daily were started.   He was also found to have a possible AKI on CKD vs progressive of underlying CKD. Last Cr in system from [facility] from early 2019 was 1.8-2; during hospitalization, his Cr ranged from 3.9-4.   For his diabetes his metformin was stopped due to renal function and glipizide 5mg  daily was started. His A1c was 7.3.  Today he states that he has done better since coming home. He has been able to take all his medication as prescribed. He endorses intermittent tingling in his fingers, mainly of the right hand. He denies chest pain, shortness of breath, headaches, vision or hearing changes, dysarthria, dysphagia, episodes of confusion, weakness, nausea or vomiting.   He has noticed new feeling of fear at night, mainly feeling that the night is ongoing. He endorses delay in being able to fall asleep (by 2-3 hours), and will wake up a couple of times per night; this causes him some distress since he feels the night keeps going on. He usually wakes up around 8am.   Please see problem based Assessment and Plan for status of patients chronic conditions.  Past Medical History:  Diagnosis Date  . Diabetes mellitus without complication ([place])     Review of Systems:   Per HPI  Physical Exam:  Vitals:   03/19/18 1339 03/19/18 1408  BP: (!) 170/81 (!) 167/89  Pulse: 73 77  Temp: 97.8 F (36.6 C)   TempSrc: Oral   SpO2: 100%   Weight: 175 lb 14.4 oz (79.8 kg)   Height: 5\' 5"  (1.651 m)    GENERAL- alert, co-operative, appears as stated age,  not in any distress. HEENT- Atraumatic, normocephalic, PERRL, EOMI, oral mucosa appears moist. CARDIAC- RRR, no murmurs, rubs or gallops. RESP- Moving equal volumes of air, and clear to auscultation bilaterally, no wheezes or crackles. ABDOMEN- Soft, nontender, bowel sounds present. NEURO- CN 2-12 intact, strength and sensation intact throughout EXTREMITIES- pulse 2+ PT, symmetric, no pedal edema. SKIN- Warm, dry, no rash or lesion. PSYCH- Normal mood and affect, appropriate thought content and speech.  Assessment & Plan:   See Encounters Tab for problem based charting.   Patient discussed with Dr. Gerrit Friends, MD Internal Medicine PGY-3

## 2018-03-27 NOTE — Progress Notes (Signed)
Internal Medicine Clinic Attending  Case discussed with Dr. Svalina  at the time of the visit.  We reviewed the resident's history and exam and pertinent patient test results.  I agree with the assessment, diagnosis, and plan of care documented in the resident's note.  

## 2018-03-30 ENCOUNTER — Ambulatory Visit: Admission: EM | Admit: 2018-03-30 | Discharge: 2018-03-30 | Discharge status: Absent without leave | Payer: Self-pay

## 2018-03-30 ENCOUNTER — Inpatient Hospital Stay: Payer: Self-pay | Admitting: Family Medicine

## 2018-03-31 ENCOUNTER — Ambulatory Visit: Payer: Self-pay

## 2018-03-31 ENCOUNTER — Encounter: Payer: Self-pay | Admitting: Internal Medicine

## 2018-04-02 ENCOUNTER — Encounter: Payer: Self-pay | Admitting: Internal Medicine

## 2018-04-02 NOTE — Telephone Encounter (Signed)
Pt seen as scheduled on 03/19/18 for HFU.[name], [name]1/31/202011:38 AM

## 2018-04-07 ENCOUNTER — Other Ambulatory Visit (HOSPITAL_COMMUNITY): Payer: Self-pay | Admitting: Internal Medicine

## 2018-04-08 ENCOUNTER — Other Ambulatory Visit (HOSPITAL_COMMUNITY): Payer: Self-pay | Admitting: Internal Medicine

## 2018-04-19 ENCOUNTER — Encounter: Payer: Self-pay | Admitting: Family Medicine

## 2018-04-22 ENCOUNTER — Ambulatory Visit (INDEPENDENT_AMBULATORY_CARE_PROVIDER_SITE_OTHER): Payer: Self-pay | Admitting: Family Medicine

## 2018-04-22 ENCOUNTER — Encounter: Payer: Self-pay | Admitting: Family Medicine

## 2018-04-22 ENCOUNTER — Encounter (HOSPITAL_COMMUNITY): Payer: Self-pay | Admitting: Emergency Medicine

## 2018-04-22 ENCOUNTER — Other Ambulatory Visit: Payer: Self-pay

## 2018-04-22 ENCOUNTER — Ambulatory Visit
Admission: EM | Admit: 2018-04-22 | Discharge: 2018-04-22 | Disposition: A | Discharge status: Home / Self Care | Payer: Self-pay | Attending: Family Medicine | Admitting: Family Medicine

## 2018-04-22 ENCOUNTER — Observation Stay (HOSPITAL_COMMUNITY)
Admission: EM | Admit: 2018-04-22 | Discharge: 2018-04-23 | Disposition: A | Discharge status: Home / Self Care | Payer: Medicaid Other | Attending: Internal Medicine | Admitting: Internal Medicine

## 2018-04-22 ENCOUNTER — Emergency Department (HOSPITAL_COMMUNITY): Payer: Medicaid Other

## 2018-04-22 ENCOUNTER — Encounter: Payer: Self-pay | Admitting: Emergency Medicine

## 2018-04-22 VITALS — BP 165/97 | HR 77 | Resp 17 | Ht 65.0 in | Wt 177.4 lb

## 2018-04-22 DIAGNOSIS — E785 Hyperlipidemia, unspecified: Secondary | ICD-10-CM | POA: Diagnosis present

## 2018-04-22 DIAGNOSIS — Z7982 Long term (current) use of aspirin: Secondary | ICD-10-CM | POA: Insufficient documentation

## 2018-04-22 DIAGNOSIS — N183 Chronic kidney disease, stage 3 unspecified: Secondary | ICD-10-CM | POA: Diagnosis present

## 2018-04-22 DIAGNOSIS — Z79899 Other long term (current) drug therapy: Secondary | ICD-10-CM | POA: Diagnosis not present

## 2018-04-22 DIAGNOSIS — N184 Chronic kidney disease, stage 4 (severe): Secondary | ICD-10-CM | POA: Diagnosis not present

## 2018-04-22 DIAGNOSIS — Z87891 Personal history of nicotine dependence: Secondary | ICD-10-CM | POA: Diagnosis not present

## 2018-04-22 DIAGNOSIS — E1122 Type 2 diabetes mellitus with diabetic chronic kidney disease: Secondary | ICD-10-CM

## 2018-04-22 DIAGNOSIS — Z23 Encounter for immunization: Secondary | ICD-10-CM

## 2018-04-22 DIAGNOSIS — E119 Type 2 diabetes mellitus without complications: Secondary | ICD-10-CM | POA: Insufficient documentation

## 2018-04-22 DIAGNOSIS — N179 Acute kidney failure, unspecified: Secondary | ICD-10-CM

## 2018-04-22 DIAGNOSIS — I639 Cerebral infarction, unspecified: Principal | ICD-10-CM | POA: Insufficient documentation

## 2018-04-22 DIAGNOSIS — E1165 Type 2 diabetes mellitus with hyperglycemia: Secondary | ICD-10-CM

## 2018-04-22 DIAGNOSIS — Z8673 Personal history of transient ischemic attack (TIA), and cerebral infarction without residual deficits: Secondary | ICD-10-CM | POA: Insufficient documentation

## 2018-04-22 DIAGNOSIS — I129 Hypertensive chronic kidney disease with stage 1 through stage 4 chronic kidney disease, or unspecified chronic kidney disease: Secondary | ICD-10-CM | POA: Diagnosis not present

## 2018-04-22 DIAGNOSIS — I10 Essential (primary) hypertension: Secondary | ICD-10-CM

## 2018-04-22 DIAGNOSIS — R200 Anesthesia of skin: Secondary | ICD-10-CM

## 2018-04-22 DIAGNOSIS — R531 Weakness: Secondary | ICD-10-CM | POA: Diagnosis present

## 2018-04-22 DIAGNOSIS — E875 Hyperkalemia: Secondary | ICD-10-CM | POA: Diagnosis present

## 2018-04-22 DIAGNOSIS — G459 Transient cerebral ischemic attack, unspecified: Secondary | ICD-10-CM

## 2018-04-22 DIAGNOSIS — IMO0001 Reserved for inherently not codable concepts without codable children: Secondary | ICD-10-CM

## 2018-04-22 LAB — CBC WITH DIFFERENTIAL/PLATELET
Abs Immature Granulocytes: 0.02 10*3/uL (ref 0.00–0.07)
Basophils Absolute: 0 10*3/uL (ref 0.0–0.1)
Basophils Relative: 1 %
Eosinophils Absolute: 0.2 10*3/uL (ref 0.0–0.5)
Eosinophils Relative: 4 %
HCT: 29.9 % — ABNORMAL LOW (ref 39.0–52.0)
Hemoglobin: 9.8 g/dL — ABNORMAL LOW (ref 13.0–17.0)
IMMATURE GRANULOCYTES: 0 %
Lymphocytes Relative: 19 %
Lymphs Abs: 1 10*3/uL (ref 0.7–4.0)
MCH: 27.2 pg (ref 26.0–34.0)
MCHC: 32.8 g/dL (ref 30.0–36.0)
MCV: 83.1 fL (ref 80.0–100.0)
Monocytes Absolute: 0.6 10*3/uL (ref 0.1–1.0)
Monocytes Relative: 11 %
NEUTROS PCT: 65 %
Neutro Abs: 3.5 10*3/uL (ref 1.7–7.7)
Platelets: 254 10*3/uL (ref 150–400)
RBC: 3.6 MIL/uL — ABNORMAL LOW (ref 4.22–5.81)
RDW: 13.2 % (ref 11.5–15.5)
WBC: 5.4 10*3/uL (ref 4.0–10.5)
nRBC: 0 % (ref 0.0–0.2)

## 2018-04-22 LAB — COMPREHENSIVE METABOLIC PANEL
ALT: 25 U/L (ref 0–44)
AST: 40 U/L (ref 15–41)
Albumin: 2.7 g/dL — ABNORMAL LOW (ref 3.5–5.0)
Alkaline Phosphatase: 65 U/L (ref 38–126)
Anion gap: 9 (ref 5–15)
BUN: 37 mg/dL — ABNORMAL HIGH (ref 6–20)
CHLORIDE: 104 mmol/L (ref 98–111)
CO2: 20 mmol/L — AB (ref 22–32)
Calcium: 8.1 mg/dL — ABNORMAL LOW (ref 8.9–10.3)
Creatinine, Ser: 4.19 mg/dL — ABNORMAL HIGH (ref 0.61–1.24)
GFR calc Af Amer: 18 mL/min — ABNORMAL LOW (ref >60)
GFR calc non Af Amer: 15 mL/min — ABNORMAL LOW (ref >60)
Glucose, Bld: 182 mg/dL — ABNORMAL HIGH (ref 70–99)
Potassium: 5.7 mmol/L — ABNORMAL HIGH (ref 3.5–5.1)
Sodium: 133 mmol/L — ABNORMAL LOW (ref 135–145)
Total Bilirubin: 1 mg/dL (ref 0.3–1.2)
Total Protein: 5.8 g/dL — ABNORMAL LOW (ref 6.5–8.1)

## 2018-04-22 LAB — URINALYSIS, ROUTINE W REFLEX MICROSCOPIC
Bacteria, UA: NONE SEEN
Bilirubin Urine: NEGATIVE
Glucose, UA: 150 mg/dL — AB
Hgb urine dipstick: NEGATIVE
Ketones, ur: NEGATIVE mg/dL
Leukocytes,Ua: NEGATIVE
Nitrite: NEGATIVE
Protein, ur: >=300 mg/dL — AB
Specific Gravity, Urine: 1.014 (ref 1.005–1.030)
pH: 6 (ref 5.0–8.0)

## 2018-04-22 LAB — ETHANOL

## 2018-04-22 LAB — POCT FASTING CBG KUC MANUAL ENTRY: POCT Glucose (KUC): 216 mg/dL — AB (ref 70–99)

## 2018-04-22 LAB — PROTIME-INR
INR: 0.96
Prothrombin Time: 12.7 seconds (ref 11.4–15.2)

## 2018-04-22 LAB — POTASSIUM: Potassium: 3.9 mmol/L (ref 3.5–5.1)

## 2018-04-22 LAB — CBG MONITORING, ED: Glucose-Capillary: 116 mg/dL — ABNORMAL HIGH (ref 70–99)

## 2018-04-22 LAB — APTT: aPTT: 30 seconds (ref 24–36)

## 2018-04-22 MED ORDER — ATORVASTATIN CALCIUM 40 MG PO TABS
40.0000 mg | ORAL_TABLET | Freq: Every day | ORAL | Status: DC
  Administered 2018-04-22: 40 mg via ORAL
  Filled 2018-04-22: qty 1

## 2018-04-22 MED ORDER — ASPIRIN 81 MG PO TBEC: 81.0000 mg | DELAYED_RELEASE_TABLET | Freq: Every day | ORAL | Status: DC

## 2018-04-22 MED ORDER — LABETALOL HCL 200 MG PO TABS: 200.0000 mg | ORAL_TABLET | Freq: Three times a day (TID) | ORAL | 3 refills | Status: DC

## 2018-04-22 MED ORDER — STROKE: EARLY STAGES OF RECOVERY BOOK
Freq: Once | Status: AC
  Administered 2018-04-22: 23:00:00
  Filled 2018-04-22: qty 1

## 2018-04-22 MED ORDER — ACETAMINOPHEN 650 MG RE SUPP: 650.0000 mg | RECTAL | Status: DC | PRN

## 2018-04-22 MED ORDER — HYDROXYZINE HCL 10 MG PO TABS
10.0000 mg | ORAL_TABLET | Freq: Three times a day (TID) | ORAL | Status: DC | PRN
  Filled 2018-04-22: qty 1

## 2018-04-22 MED ORDER — ACETAMINOPHEN 160 MG/5ML PO SOLN: 650.0000 mg | ORAL | Status: DC | PRN

## 2018-04-22 MED ORDER — HYDRALAZINE HCL 100 MG PO TABS: 50.0000 mg | ORAL_TABLET | Freq: Two times a day (BID) | ORAL | Status: DC

## 2018-04-22 MED ORDER — HYDRALAZINE HCL 25 MG PO TABS: 50.0000 mg | ORAL_TABLET | Freq: Two times a day (BID) | ORAL | Status: DC

## 2018-04-22 MED ORDER — ACETAMINOPHEN 325 MG PO TABS: 650.0000 mg | ORAL_TABLET | ORAL | Status: DC | PRN

## 2018-04-22 MED ORDER — SODIUM CHLORIDE 0.9 % IV SOLN
INTRAVENOUS | Status: DC
  Administered 2018-04-23: 02:00:00 via INTRAVENOUS

## 2018-04-22 MED ORDER — LABETALOL HCL 200 MG PO TABS
200.0000 mg | ORAL_TABLET | Freq: Three times a day (TID) | ORAL | Status: DC
  Administered 2018-04-22: 200 mg via ORAL
  Filled 2018-04-22: qty 1

## 2018-04-22 MED ORDER — GLIPIZIDE ER 5 MG PO TB24: 5.0000 mg | ORAL_TABLET | Freq: Every day | ORAL | 3 refills | Status: DC

## 2018-04-22 MED ORDER — ASPIRIN 81 MG PO TBEC: 81.0000 mg | DELAYED_RELEASE_TABLET | Freq: Every day | ORAL | 3 refills | Status: DC

## 2018-04-22 MED ORDER — GABAPENTIN 100 MG PO CAPS
100.0000 mg | ORAL_CAPSULE | Freq: Three times a day (TID) | ORAL | Status: DC
  Administered 2018-04-22 – 2018-04-23 (×2): 100 mg via ORAL
  Filled 2018-04-22 (×2): qty 1

## 2018-04-22 MED ORDER — SENNOSIDES-DOCUSATE SODIUM 8.6-50 MG PO TABS: 1.0000 | ORAL_TABLET | Freq: Every evening | ORAL | Status: DC | PRN

## 2018-04-22 MED ORDER — CLONIDINE HCL 0.1 MG PO TABS
0.1000 mg | ORAL_TABLET | Freq: Once | ORAL | Status: AC
  Administered 2018-04-22: 0.1 mg via ORAL

## 2018-04-22 MED ORDER — INSULIN ASPART 100 UNIT/ML ~~LOC~~ SOLN: 0.0000 [IU] | Freq: Every day | SUBCUTANEOUS | Status: DC

## 2018-04-22 MED ORDER — HYDRALAZINE HCL 20 MG/ML IJ SOLN: 5.0000 mg | INTRAMUSCULAR | Status: DC | PRN

## 2018-04-22 MED ORDER — GABAPENTIN 100 MG PO CAPS: 100.0000 mg | ORAL_CAPSULE | Freq: Three times a day (TID) | ORAL | 3 refills | Status: DC

## 2018-04-22 MED ORDER — HYDRALAZINE HCL 100 MG PO TABS: 50.0000 mg | ORAL_TABLET | Freq: Two times a day (BID) | ORAL | 3 refills | Status: DC

## 2018-04-22 MED ORDER — INSULIN ASPART 100 UNIT/ML ~~LOC~~ SOLN: 0.0000 [IU] | Freq: Three times a day (TID) | SUBCUTANEOUS | Status: DC

## 2018-04-22 MED ORDER — AMLODIPINE BESYLATE 5 MG PO TABS: 5.0000 mg | ORAL_TABLET | Freq: Every day | ORAL | 3 refills | Status: DC

## 2018-04-22 MED ORDER — ATORVASTATIN CALCIUM 40 MG PO TABS: 40.0000 mg | ORAL_TABLET | Freq: Every day | ORAL | 3 refills | Status: DC

## 2018-04-22 MED ORDER — HEPARIN SODIUM (PORCINE) 5000 UNIT/ML IJ SOLN
5000.0000 [IU] | Freq: Three times a day (TID) | INTRAMUSCULAR | Status: DC
  Administered 2018-04-23: 5000 [IU] via SUBCUTANEOUS
  Filled 2018-04-22: qty 1

## 2018-04-22 MED ORDER — ASPIRIN EC 81 MG PO TBEC
81.0000 mg | DELAYED_RELEASE_TABLET | Freq: Every day | ORAL | Status: DC
  Administered 2018-04-23: 81 mg via ORAL
  Filled 2018-04-22: qty 1

## 2018-04-22 MED FILL — hydrALAZINE HCL 100 MG TABS: 100 | 30 days supply | Qty: 30 | Fill #0

## 2018-04-22 MED FILL — LABETALOL HCL 200 MG TABLET: 200 | 30 days supply | Qty: 90 | Fill #0

## 2018-04-22 MED FILL — GABAPENTIN 100 MG CAP: 100 | 30 days supply | Qty: 90 | Fill #0

## 2018-04-22 MED FILL — AMLODIPINE BESYLATE 5 MG TA: 5 | 30 days supply | Qty: 30 | Fill #0

## 2018-04-22 MED FILL — glipiZIDE XL 5 MG TB24: 5 | 30 days supply | Qty: 30 | Fill #0

## 2018-04-22 MED FILL — ATORVASTATIN CALCIUM 40 MG: 40 | 30 days supply | Qty: 30 | Fill #0

## 2018-04-22 NOTE — ED Triage Notes (Signed)
Pt presents to [facility] for assessment after he was eating lunch this afternoon and began to feel sick to his stomach, experience numbness in his stomach and left arm and face, and was having difficulty speaking.  Symptoms now seem better, except the numbness

## 2018-04-22 NOTE — ED Provider Notes (Signed)
[place]   [id number] 04/22/18 Arrival Time: 6734  CC: Numbness  SUBJECTIVE: Arthor Captain [phone] used to obtain HPI.    Max Sanchez is a 53 y.o. male hx significant for CVA, CKD, DM type II, HLD, HTN, who presents with complaint of numbness that occurred 30 minutes ago.  Now resolved.  Denies a precipitating event, but states symptoms began while eating. Also of note, saw PCP this morning and started on new medication for blood pressure. Localizes numbness to left side of mouth, left hand, and left side of trunk.  States symptoms resolved within 30 minutes on onset.  Denies aggravating or alleviating factors.  Admits to previous symptoms earlier this month that resolved without intervention.  Admits to associated slurred speech, now resolved. Denies fever, chills, nausea, vomiting, chest pain, SOB, weakness, memory or emotional changes, facial drooping/ asymmetry, incoordination, numbness or tingling, abdominal pain, changes in bowel or bladder habits.     ROS: As per HPI. Past Medical History:  Diagnosis Date  . CKD (chronic kidney disease), stage III ([place]) 03/10/2018  . Diabetic neuropathy ([place])   . History of CVA (cerebrovascular accident) 03/10/2018  . Hyperlipidemia 03/19/2018  . Hypertension 03/10/2018  . Hypertensive retinopathy of left eye   . 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 Type 2 diabetes mellitus with complication, without long-term current use of insulin [employer])    Past Surgical History:  Procedure Laterality Date  . EYE SURGERY Bilateral   . FOOT SURGERY    . PR RPR COMPLEX RETINA DETACH VITRECT &MEMBRANE PEEL Left 10/01/2016  . PR RPR COMPLEX RETINA DETACH VITRECT &MEMBRANE PEEL Right 12/03/2016  . PR RPR COMPLEX RETINA DETACH VITRECT &MEMBRANE PEEL Right 04/01/2017  . PR XCAPSL CTRC RMVL INSJ IO LENS PROSTH W/O ECP Right 12/03/2016   No Known Allergies No current facility-administered  medications on file prior to encounter.    Current Outpatient Medications on File Prior to Encounter  Medication Sig Dispense Refill  . amLODipine (NORVASC) 5 MG tablet Take 1 tablet (5 mg total) by mouth daily for 30 days. 30 tablet 3  . aspirin 81 MG EC tablet Take 1 tablet (81 mg total) by mouth daily. IM program 30 tablet 3  . atorvastatin (LIPITOR) 40 MG tablet Take 1 tablet (40 mg total) by mouth daily at 6 PM. 30 tablet 3  . gabapentin (NEURONTIN) 100 MG capsule Take 1 capsule (100 mg total) by mouth 3 (three) times daily. 90 capsule 3  . glipiZIDE (GLUCOTROL XL) 5 MG 24 hr tablet Take 1 tablet (5 mg total) by mouth daily with breakfast. 60 tablet 3  . hydrALAZINE (APRESOLINE) 100 MG tablet Take 0.5 tablets (50 mg total) by mouth 2 (two) times daily. 60 tablet 3  . labetalol (NORMODYNE) 200 MG tablet Take 1 tablet (200 mg total) by mouth 3 (three) times daily. 90 tablet 3   Social History   Socioeconomic History  . Marital status: Married    Spouse name: Not on file  . Number of children: Not on file  . Years of education: Not on file  . Highest education level: Not on file  Occupational History  . Not on file  Social Needs  . Financial resource strain: Not on file  . Food insecurity:    Worry: Not on file    Inability: Not on file  . Transportation needs:    Medical: Not on file    Non-medical: Not  on file  Tobacco Use  . Smoking status: Former Research scientist (life sciences)  . Smokeless tobacco: Never Used  . Tobacco comment: 10 pack year history; quit 2 years ago  Substance and Sexual Activity  . Alcohol use: Not Currently    Comment: stopped 2 years ago  . Drug use: Not Currently    Comment: tried cocaine and marijuana when younger  . Sexual activity: Not on file  Lifestyle  . Physical activity:    Days per week: Not on file    Minutes per session: Not on file  . Stress: Not on file  Relationships  . Social connections:    Talks on phone: Not on file    Gets together: Not on file      Attends religious service: Not on file    Active member of club or organization: Not on file    Attends meetings of clubs or organizations: Not on file    Relationship status: Not on file  . Intimate partner violence:    Fear of current or ex partner: Not on file    Emotionally abused: Not on file    Physically abused: Not on file    Forced sexual activity: Not on file  Other Topics Concern  . Not on file  Social History Narrative  . Not on file   Family History  Problem Relation Age of Onset  . Diabetes Father   . Diabetes Brother   . Kidney disease Brother     OBJECTIVE:  Vitals:   04/22/18 1604  BP: (!) 166/91  Pulse: 77  Resp: 16  Temp: 97.7 F (36.5 C)  TempSrc: Oral  SpO2: 98%    General appearance: AOx3; no distress; appears stated age 37: normocephalic; atraumatic; PERRL; EOMI grossly; Nose without rhinorrhea; oropharynx clear, dentition intact Neck: supple with FROM Lungs: clear to auscultation bilaterally Heart: Tachycardia Extremities: moves all extremities normally; no cyanosis or edema; symmetrical with no gross deformities Skin: warm and dry Neurologic: CN 2-12 grossly intact; ambulates without difficulty; Finger to nose without difficulty, strength and sensation intact and symmetrical about the upper and lower extremities; patellar tendon reflexes 2+ and intact Psychological: alert and cooperative; normal mood and affect   LABS:  Results for orders placed or performed during the hospital encounter of 04/22/18 (from the past 24 hour(s))  POCT fasting CBG (manual entry)     Status: Abnormal   Collection Time: 04/22/18  4:51 PM  Result Value Ref Range   POCT Glucose ([facility]) 216 (A) 70 - 99 mg/dL    ASSESSMENT & PLAN:  1. Numbness    Recommending further evaluation and management in the ED for possible TIA.  Patient and daughter aware and in agreement with plan. EMS transferred patient to [facility] ED.      Lestine Box, PA-C 04/22/18 1708

## 2018-04-22 NOTE — ED Provider Notes (Signed)
[facility] EMERGENCY DEPARTMENT Provider Note   CSN: [id number] Arrival date & time: 04/22/18  1729    History   Chief Complaint No chief complaint on file.   HPI Max Sanchez is a 53 y.o. male with history of CVA  In 03/2018, CKD, DM type 2, hypertension, hyperlipidemia presented to emergency department with chief complaint of numbness x 3 hours prior to arrival.  Patient states the numbness resolved within 30 minutes after onset.  He reports localized numbness to left side of his mouth, left hand and left side of trunk.  Also reports associated slurred speech and bilateral lower extremity numbness.  The symptoms started while he was eating today.  He denies any alleviating or aggravating factors.  He did not take any medications prior to arrival.  He denies fever, chills, nausea, vomiting, chest pain, shortness of breath, weakness numbness or tingling, abdominal pain, bowel or bladder incontinence. Patient went to urgent care prior to arrival and was sent to emergency department for evaluation of possible TIA.  History obtained using interpreter.      Past Medical History:  Diagnosis Date  . CKD (chronic kidney disease), stage III ([place]) 03/10/2018  . Diabetic neuropathy ([place])   . History of CVA (cerebrovascular accident) 03/10/2018  . Hyperlipidemia 03/19/2018  . Hypertension 03/10/2018  . Hypertensive retinopathy of left eye   . 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 Type 2 diabetes mellitus with complication, without long-term current use of insulin [facility])     Patient Active Problem List   Diagnosis Date Noted  . Stroke ([place]) 04/22/2018  . Type II diabetes mellitus with renal manifestations ([place]) 04/22/2018  . Hyperkalemia 04/22/2018  . Hyperlipidemia 03/19/2018  . Insomnia 03/19/2018  . Hypertension 03/10/2018  . CKD (chronic kidney disease), stage III ([place]) 03/10/2018  . Acute-on-chronic kidney  injury ([place]) 03/10/2018  . Proteinuria 03/10/2018  . Prolonged Q-T interval on ECG 03/10/2018  . History of CVA (cerebrovascular accident) 03/10/2018    Past Surgical History:  Procedure Laterality Date  . EYE SURGERY Bilateral   . FOOT SURGERY    . PR RPR COMPLEX RETINA DETACH VITRECT &MEMBRANE PEEL Left 10/01/2016  . PR RPR COMPLEX RETINA DETACH VITRECT &MEMBRANE PEEL Right 12/03/2016  . PR RPR COMPLEX RETINA DETACH VITRECT &MEMBRANE PEEL Right 04/01/2017  . PR XCAPSL CTRC RMVL INSJ IO LENS PROSTH W/O ECP Right 12/03/2016        Home Medications    Prior to Admission medications   Medication Sig Start Date End Date Taking? Authorizing Provider  aspirin 81 MG EC tablet Take 1 tablet (81 mg total) by mouth daily. IM program 04/22/18  Yes Scot Jun, FNP  atorvastatin (LIPITOR) 40 MG tablet Take 1 tablet (40 mg total) by mouth daily at 6 PM. 04/22/18  Yes Scot Jun, FNP  glipiZIDE (GLUCOTROL XL) 5 MG 24 hr tablet Take 1 tablet (5 mg total) by mouth daily with breakfast. 04/22/18  Yes Scot Jun, FNP  hydrALAZINE (APRESOLINE) 100 MG tablet Take 0.5 tablets (50 mg total) by mouth 2 (two) times daily. 04/22/18  Yes Scot Jun, FNP  amLODipine (NORVASC) 5 MG tablet Take 1 tablet (5 mg total) by mouth daily for 30 days. 04/22/18 05/22/18  Scot Jun, FNP  gabapentin (NEURONTIN) 100 MG capsule Take 1 capsule (100 mg total) by mouth 3 (three) times daily. 04/22/18   Scot Jun, FNP  labetalol (  NORMODYNE) 200 MG tablet Take 1 tablet (200 mg total) by mouth 3 (three) times daily. 04/22/18   Scot Jun, FNP    Family History Family History  Problem Relation Age of Onset  . Diabetes Father   . Diabetes Brother   . Kidney disease Brother     Social History Social History   Tobacco Use  . Smoking status: Former Research scientist (life sciences)  . Smokeless tobacco: Never Used  . Tobacco comment: 10 pack year history; quit 2 years ago  Substance Use Topics  .  Alcohol use: Not Currently    Comment: stopped 2 years ago  . Drug use: Not Currently    Comment: tried cocaine and marijuana when younger     Allergies   Patient has no known allergies.   Review of Systems Review of Systems  Neurological: Positive for facial asymmetry, speech difficulty and numbness. Negative for dizziness and light-headedness.  All other systems reviewed and are negative.    Physical Exam Updated Vital Signs BP (!) 173/94   Pulse 73   Temp 97.9 F (36.6 C) (Oral)   Resp 12   Ht 5\' 5"  (1.651 m)   Wt 80.3 kg   SpO2 99%   BMI 29.45 kg/m   Physical Exam Vitals signs and nursing note reviewed.  Constitutional:      Appearance: He is not ill-appearing or toxic-appearing.  HENT:     Head: Normocephalic and atraumatic.     Nose: Nose normal.     Mouth/Throat:     Mouth: Mucous membranes are moist.     Pharynx: Oropharynx is clear.  Eyes:     Conjunctiva/sclera: Conjunctivae normal.  Neck:     Musculoskeletal: Normal range of motion.  Cardiovascular:     Rate and Rhythm: Normal rate and regular rhythm.     Pulses: Normal pulses.     Heart sounds: Normal heart sounds.  Pulmonary:     Effort: Pulmonary effort is normal.     Breath sounds: Normal breath sounds.  Abdominal:     General: There is no distension.     Palpations: Abdomen is soft.     Tenderness: There is no abdominal tenderness. There is no guarding or rebound.  Musculoskeletal: Normal range of motion.  Skin:    General: Skin is warm and dry.     Capillary Refill: Capillary refill takes less than 2 seconds.  Neurological:     Mental Status: He is alert. Mental status is at baseline.     Motor: No weakness.     Comments: Speech is clear and goal oriented, follows commands CN III-XII intact, no facial droop Normal strength in upper and lower extremities bilaterally including dorsiflexion and plantar flexion, strong and equal grip strength Sensation normal to light and sharp  touch Moves extremities without ataxia, coordination intact Normal finger to nose and rapid alternating movements Normal gait and balance   Psychiatric:        Behavior: Behavior normal.      ED Treatments / Results  Labs (all labs ordered are listed, but only abnormal results are displayed) Labs Reviewed  CBC WITH DIFFERENTIAL/PLATELET - Abnormal; Notable for the following components:      Result Value   RBC 3.60 (*)    Hemoglobin 9.8 (*)    HCT 29.9 (*)    All other components within normal limits  COMPREHENSIVE METABOLIC PANEL - Abnormal; Notable for the following components:   Sodium 133 (*)    Potassium 5.7 (*)  CO2 20 (*)    Glucose, Bld 182 (*)    BUN 37 (*)    Creatinine, Ser 4.19 (*)    Calcium 8.1 (*)    Total Protein 5.8 (*)    Albumin 2.7 (*)    GFR calc non Af Amer 15 (*)    GFR calc Af Amer 18 (*)    All other components within normal limits  APTT  PROTIME-INR  ETHANOL  URINALYSIS, ROUTINE W REFLEX MICROSCOPIC  POTASSIUM    EKG EKG Interpretation  Date/Time:  Thursday April 22 2018 17:30:23 EST Ventricular Rate:  78 PR Interval:    QRS Duration: 102 QT Interval:  423 QTC Calculation: 482 R Axis:   53 Text Interpretation:  Sinus rhythm Borderline prolonged QT interval Baseline wander in lead(s) V1 V4 No significant change since last tracing Confirmed by Dorie Rank [phone]) on 04/22/2018 5:34:13 PM   Radiology Mr Brain Wo Contrast  Result Date: 04/22/2018 CLINICAL DATA:  53 y/o M; episode of slurred speech, facial droop, left-sided weakness. TIA, initial exam. EXAM: MRI HEAD WITHOUT CONTRAST TECHNIQUE: Multiplanar, multiecho pulse sequences of the brain and surrounding structures were obtained without intravenous contrast. COMPARISON:  03/10/2018 CT head, MRI head, MRA head. FINDINGS: Brain: Single punctate foci of reduced diffusion within the right parietal periventricular white matter compatible with acute/early subacute infarction (series 5  image 81). No associated hemorrhage or mass effect. There are several foci of faint diffusion hyperintensity with intermediate ADC scattered throughout the brain corresponding to infarctions on the prior MRI. Stable early confluent nonspecific T2 FLAIR hyperintensities in subcortical and periventricular white matter are compatible with moderate chronic microvascular ischemic changes. Stable volume loss of the brain. Stable small chronic infarction within the right inferolateral frontal cortex. No extra-axial collection, hydrocephalus, mass effect, or herniation. Vascular: Normal flow voids. Skull and upper cervical spine: Normal marrow signal. Sinuses/Orbits: Negative. Other: Right intra-ocular lens replacement. IMPRESSION: 1. Punctate acute/early subacute infarction within the right parietal periventricular white matter. No hemorrhage or mass effect. 2. Stable moderate chronic microvascular ischemic changes and stable volume loss of the brain. Several scattered punctate now chronic infarctions are present stable in distribution from prior MRI. These results were called by telephone at the time of interpretation on 04/22/2018 at 9:13 pm to PA [facility] , who verbally acknowledged these results. Electronically Signed   By: Kristine Garbe M.D.   On: 04/22/2018 21:17    Procedures Procedures (including critical care time)  Medications Ordered in ED Medications - No data to display   Initial Impression / Assessment and Plan / ED Course  I have reviewed the triage vital signs and the nursing notes.  Pertinent labs & imaging results that were available during my care of the patient were reviewed by me and considered in my medical decision making (see chart for details).     Pt is afebrile, non toxic appearing, not in acute distress. He has history of hypertension that is poorly controlled. He has left sided numbness prior to arrival that resolved within 30 minutes. He was admitted for CVA  in 03/2018 and discharged home with Plavix and aspirin. Pt finished plavix course and is currently taking aspirin and his home medications as prescribed. His neuro exam is without focal deficit, however given his recent CVA and TIA symptoms will get MR Brain without contrast.  Discussed the case with on-call neurologist Dr. Leonel Ramsay who recommended MR brain and basic stroke workup.  Radiologist called with results of the MR brain.  It shows punctate acute/early subacute infarction within the right parietal periventricular white matter without hemorrhage or mass effect. His labs show CBC unremarkable,. He has known diagnosis of CKD and his BUN/Creatinine today are similar to 1 month ago. PT/PTT within normal limits. Pt had recent stroke work-up however given his new MRI findings would benefit from admission for hypertension control. The attending Dr. Tomi Bamberger spoke with hospitalist regarding admission and neurology consult. The patient was discussed with and seen by Dr. Tomi Bamberger who agrees with the treatment plan.      Final Clinical Impressions(s) / ED Diagnoses   Final diagnoses:  TIA (transient ischemic attack)    ED Discharge Orders    None       Flint Melter 04/22/18 2140    Dorie Rank, MD 04/24/18 [phone]

## 2018-04-22 NOTE — H&P (Addendum)
History and Physical    Max Sanchez ZRA:[id number] DOB: [birth year] DOA: 04/22/2018  Referring MD/NP/PA:   PCP: Max Jun, FNP   Patient coming from:  The patient is coming from home.  At baseline, pt is independent for most of ADL.        Chief Complaint: Left-sided weakness and numbness, slurred speech, facial droop  HPI: Max Sanchez is a 53 y.o. male with medical history significant of hypertension, hyperlipidemia, diabetes mellitus, recent stroke, CKD-3, who presents with left-sided weakness, numbness, slurred speech and facial droop.  Patient was recently hospitalized from 1/8-1/10 due to stroke. MRI showed multiple punctate subcortical infarcts in bilateral cerebral hemisphere.  2D echo 03/10/2018 showed EF of 55 to 60% with grade 1 diastolic dysfunction. Carotid Doppler showed Right Carotid with Velocities in the right ICA are consistent with a 1-39% stenosis; and left Carotid with velocities in the left ICA are consistent with a 40-59% stenosis; and vertebrals with bilateral vertebral arteries demonstrate antegrade flow. Neurology started him on ASA 81 and Plavix 75 mg for 3 weeks, followed by aspirin alone. He has been doing well until 15:30 when he started having left-sided weakness, numbness, slurred speech and facial droop.  Symptoms have resolved in urgent care.  Currently patient does not have any chest pain, shortness breath, cough, fever or chills.  Denies nausea vomiting, diarrhea, abdominal, symptoms of UTI.  Pt PCP is Max Betters, FNP at [facility].  ED Course: pt was found to have WBC 5.4, INR 0.96, renal function slightly worsened in recent baseline, temperature normal, no tachycardia, oxygen saturation 99% on room air, blood pressure 173/94.  MRI of the brain showed punctate acute/early subacute infarction within the right parietal periventricular white matter. Pt is placed on tele bed for obs.  Dr. Cheral Sanchez of neurology was consulted.  MRI of  brain; 1. Punctate acute/early subacute infarction within the right parietal periventricular white matter. No hemorrhage or mass effect. 2. Stable moderate chronic microvascular ischemic changes and stable volume loss of the brain. Several scattered punctate now chronic infarctions are present stable in distribution from prior MRI.  Review of Systems:   General: no fevers, chills, no body weight gain, has fatigue HEENT: no blurry vision, hearing changes or sore throat Respiratory: no dyspnea, coughing, wheezing CV: no chest pain, no palpitations GI: no nausea, vomiting, abdominal pain, diarrhea, constipation GU: no dysuria, burning on urination, increased urinary frequency, hematuria  Ext: no leg edema Neuro: Had slurred speech, left-sided weakness and numbness, facial droop Skin: no rash, no skin tear. MSK: No muscle spasm, no deformity, no limitation of range of movement in spin Heme: No easy bruising.  Travel history: No recent long distant travel.  Allergy: No Known Allergies  Past Medical History:  Diagnosis Date  . CKD (chronic kidney disease), stage III ([place]) 03/10/2018  . Diabetic neuropathy ([place])   . History of CVA (cerebrovascular accident) 03/10/2018  . Hyperlipidemia 03/19/2018  . Hypertension 03/10/2018  . Hypertensive retinopathy of left eye   . 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 Type 2 diabetes mellitus with complication, without long-term current use of insulin [facility])     Past Surgical History:  Procedure Laterality Date  . EYE SURGERY Bilateral   . FOOT SURGERY    . PR RPR COMPLEX RETINA DETACH VITRECT &MEMBRANE PEEL Left 10/01/2016  . PR RPR COMPLEX RETINA DETACH VITRECT &MEMBRANE PEEL Right 12/03/2016  . PR RPR COMPLEX RETINA DETACH VITRECT &  MEMBRANE PEEL Right 04/01/2017  . PR XCAPSL CTRC RMVL INSJ IO LENS PROSTH W/O ECP Right 12/03/2016    Social History:  reports that he has quit smoking. He  has never used smokeless tobacco. He reports previous alcohol use. He reports previous drug use.  Family History:  Family History  Problem Relation Age of Onset  . Diabetes Father   . Diabetes Brother   . Kidney disease Brother      Prior to Admission medications   Medication Sig Start Date End Date Taking? Authorizing Provider  aspirin 81 MG EC tablet Take 1 tablet (81 mg total) by mouth daily. IM program 04/22/18  Yes Max Jun, FNP  atorvastatin (LIPITOR) 40 MG tablet Take 1 tablet (40 mg total) by mouth daily at 6 PM. 04/22/18  Yes Max Jun, FNP  glipiZIDE (GLUCOTROL XL) 5 MG 24 hr tablet Take 1 tablet (5 mg total) by mouth daily with breakfast. 04/22/18  Yes Max Jun, FNP  hydrALAZINE (APRESOLINE) 100 MG tablet Take 0.5 tablets (50 mg total) by mouth 2 (two) times daily. 04/22/18  Yes Max Jun, FNP  amLODipine (NORVASC) 5 MG tablet Take 1 tablet (5 mg total) by mouth daily for 30 days. 04/22/18 05/22/18  Max Jun, FNP  gabapentin (NEURONTIN) 100 MG capsule Take 1 capsule (100 mg total) by mouth 3 (three) times daily. 04/22/18   Max Jun, FNP  labetalol (NORMODYNE) 200 MG tablet Take 1 tablet (200 mg total) by mouth 3 (three) times daily. 04/22/18   Max Jun, FNP    Physical Exam: Vitals:   04/22/18 1930 04/22/18 1945 04/22/18 2000 04/22/18 2015  BP: (!) 154/87 (!) 162/86 (!) 161/89 (!) 173/94  Pulse: 71 72 70 73  Resp: 14 15 14 12   Temp:      TempSrc:      SpO2: 97% 97% 99% 99%  Weight:      Height:       General: Not in acute distress HEENT:       Eyes: PERRL, EOMI, no scleral icterus.       ENT: No discharge from the ears and nose, no pharynx injection, no tonsillar enlargement.        Neck: No JVD, no bruit, no mass felt. Heme: No neck lymph node enlargement. Cardiac: S1/S2, RRR, No murmurs, No gallops or rubs. Respiratory:  No rales, wheezing, rhonchi or rubs. GI: Soft, nondistended, nontender, no rebound  pain, no organomegaly, BS present. GU: No hematuria Ext: No pitting leg edema bilaterally. 2+DP/PT pulse bilaterally. Musculoskeletal: No joint deformities, No joint redness or warmth, no limitation of ROM in spin. Skin: No rashes.  Neuro: Alert, oriented X3, cranial nerves II-XII grossly intact, moves all extremities normally. Muscle strength 5/5 in all extremities, sensation to light touch intact. Brachial reflex 2+ bilaterally. Knee reflex 1+ bilaterally. Negative Babinski's sign. Normal finger to nose test. Psych: Patient is not psychotic, no suicidal or hemocidal ideation.  Labs on Admission: I have personally reviewed following labs and imaging studies  CBC: Recent Labs  Lab 04/22/18 1811  WBC 5.4  NEUTROABS 3.5  HGB 9.8*  HCT 29.9*  MCV 83.1  PLT 384   Basic Metabolic Panel: Recent Labs  Lab 04/22/18 1811 04/22/18 2142  NA 133*  --   K 5.7* 3.9  CL 104  --   CO2 20*  --   GLUCOSE 182*  --   BUN 37*  --   CREATININE 4.19*  --  CALCIUM 8.1*  --    GFR: Estimated Creatinine Clearance: 20.4 mL/min (A) (by C-G formula based on SCr of 4.19 mg/dL (H)). Liver Function Tests: Recent Labs  Lab 04/22/18 1811  AST 40  ALT 25  ALKPHOS 65  BILITOT 1.0  PROT 5.8*  ALBUMIN 2.7*   No results for input(s): LIPASE, AMYLASE in the last 168 hours. No results for input(s): AMMONIA in the last 168 hours. Coagulation Profile: Recent Labs  Lab 04/22/18 1811  INR 0.96   Cardiac Enzymes: No results for input(s): CKTOTAL, CKMB, CKMBINDEX, TROPONINI in the last 168 hours. BNP (last 3 results) No results for input(s): PROBNP in the last 8760 hours. HbA1C: No results for input(s): HGBA1C in the last 72 hours. CBG: No results for input(s): GLUCAP in the last 168 hours. Lipid Profile: No results for input(s): CHOL, HDL, LDLCALC, TRIG, CHOLHDL, LDLDIRECT in the last 72 hours. Thyroid Function Tests: No results for input(s): TSH, T4TOTAL, FREET4, T3FREE, THYROIDAB in the last  72 hours. Anemia Panel: No results for input(s): VITAMINB12, FOLATE, FERRITIN, TIBC, IRON, RETICCTPCT in the last 72 hours. Urine analysis:    Component Value Date/Time   COLORURINE YELLOW 03/10/2018 0612   APPEARANCEUR CLEAR 03/10/2018 0612   LABSPEC 1.014 03/10/2018 0612   PHURINE 5.0 03/10/2018 0612   GLUCOSEU >=500 (A) 03/10/2018 0612   HGBUR SMALL (A) 03/10/2018 0612   BILIRUBINUR NEGATIVE 03/10/2018 0612   BILIRUBINUR negative 08/29/2015 1652   KETONESUR 5 (A) 03/10/2018 0612   PROTEINUR >=300 (A) 03/10/2018 0612   UROBILINOGEN 0.2 08/29/2015 1652   NITRITE NEGATIVE 03/10/2018 0612   LEUKOCYTESUR NEGATIVE 03/10/2018 0612   Sepsis Labs: @LABRCNTIP (procalcitonin:4,lacticidven:4) )No results found for this or any previous visit (from the past 240 hour(s)).   Radiological Exams on Admission: Mr Brain Wo Contrast  Result Date: 04/22/2018 CLINICAL DATA:  53 y/o M; episode of slurred speech, facial droop, left-sided weakness. TIA, initial exam. EXAM: MRI HEAD WITHOUT CONTRAST TECHNIQUE: Multiplanar, multiecho pulse sequences of the brain and surrounding structures were obtained without intravenous contrast. COMPARISON:  03/10/2018 CT head, MRI head, MRA head. FINDINGS: Brain: Single punctate foci of reduced diffusion within the right parietal periventricular white matter compatible with acute/early subacute infarction (series 5 image 81). No associated hemorrhage or mass effect. There are several foci of faint diffusion hyperintensity with intermediate ADC scattered throughout the brain corresponding to infarctions on the prior MRI. Stable early confluent nonspecific T2 FLAIR hyperintensities in subcortical and periventricular white matter are compatible with moderate chronic microvascular ischemic changes. Stable volume loss of the brain. Stable small chronic infarction within the right inferolateral frontal cortex. No extra-axial collection, hydrocephalus, mass effect, or herniation.  Vascular: Normal flow voids. Skull and upper cervical spine: Normal marrow signal. Sinuses/Orbits: Negative. Other: Right intra-ocular lens replacement. IMPRESSION: 1. Punctate acute/early subacute infarction within the right parietal periventricular white matter. No hemorrhage or mass effect. 2. Stable moderate chronic microvascular ischemic changes and stable volume loss of the brain. Several scattered punctate now chronic infarctions are present stable in distribution from prior MRI. These results were called by telephone at the time of interpretation on 04/22/2018 at 9:13 pm to PA [facility] , who verbally acknowledged these results. Electronically Signed   By: Kristine Garbe M.D.   On: 04/22/2018 21:17     EKG: Independently reviewed.  Sinus rhythm, QTC 482, nonspecific T wave change.  Assessment/Plan Principal Problem:   Stroke [facility]) Active Problems:   Hypertension   CKD (chronic kidney disease), stage III (  [place])   History of CVA (cerebrovascular accident)   Hyperlipidemia   Type II diabetes mellitus with renal manifestations (HCC)   Hyperkalemia   New stroke and recent hx of stroke: pt recently had a stroke work-up, currently still taking aspirin and Lipitor. Now developed new  punctate acute/early subacute infarction within the right parietal periventricular white matter. No hemorrhage or mass effect.  Neurology was consulted.     will admit to tele bed  - will place on tele bed for obs - Highly appreciated neurologist's consultation,will follow up recommendations as follows - continue ASA and lipitor - swallowing screen. If fails, will get SLP - PT/OT consult  HTN: Bp is 173/94. -hold home medications: Hydralazine and labetalol to allow permissive hypertension -IV hydralazine prn for SBP>220 or dBP>120  CKD (chronic kidney disease), stage III ([place]): pt has hx of CKD-III, but his kidney function has been deteriorating, his recent baseline creatinine 3.8-4.0.   Today his creatinine is 4.14, BUN 37, which is close to recent baseline. He seems to reach CKD-IV now.  Daughter states that patient has nephrologist at [facility], but did not follow-up recently. -f/u renal Fx by BMP -IVF: NS at 75 cc/h -will need to f/u with renal  Hyperlipidemia: -Lipitor  Type II diabetes mellitus with renal manifestations ([place]): Last A1c 7.3 on 03/10/18, fairly controled. Patient is taking glipizide at home -SSI  Hyperkalemia: K=5.7, but has blood hemolysis -Repeat potassium level-->K=3.9       DVT ppx: SQ Lovenox Code Status: Full code Family Communication:  Yes, patient's daughter   at bed side Disposition Plan:  Anticipate discharge back to previous home environment Consults called: Neurology, Dr. Cheral Sanchez Admission status: Obs / tele    Date of Service 04/22/2018    [place] [facility]   If 7PM-7AM, please contact night-coverage [email or web address] Password [facility] 04/22/2018, 10:19 PM

## 2018-04-22 NOTE — Consult Note (Signed)
Referring Physician: Dr. Blaine Hamper    Chief Complaint: Left perioral, hand and trunk numbness  HPI: Max Sanchez is an 53 y.o. male with a recent stroke in January who presented to the ED via EMS with new stroke/TIA symptoms. LKN was 1530 while eating, at which time he started having left facial droop, slurred speech, patchy left sided numbness and left sided weakness. He states that his numbness involved the upper and lower lips on the left side of his mouth, as well as left hand and left trunk sensory numbness. His symptoms resolved by 1600 while at an Urgent Care facility. He was seen at [facility] on 1/7 for a stroke. BP was 172/96 per EMS, with a CBG of 219. Of note, his PCP started him on a new BP medication this AM. He takes ASA and atorvastatin at home.   LSN: [id number] Given: No: Symptoms resolved  Past Medical History:  Diagnosis Date  . CKD (chronic kidney disease), stage III ([place]) 03/10/2018  . Diabetic neuropathy ([place])   . History of CVA (cerebrovascular accident) 03/10/2018  . Hyperlipidemia 03/19/2018  . Hypertension 03/10/2018  . Hypertensive retinopathy of left eye   . 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 Type 2 diabetes mellitus with complication, without long-term current use of insulin [facility])     Past Surgical History:  Procedure Laterality Date  . EYE SURGERY Bilateral   . FOOT SURGERY    . PR RPR COMPLEX RETINA DETACH VITRECT &MEMBRANE PEEL Left 10/01/2016  . PR RPR COMPLEX RETINA DETACH VITRECT &MEMBRANE PEEL Right 12/03/2016  . PR RPR COMPLEX RETINA DETACH VITRECT &MEMBRANE PEEL Right 04/01/2017  . PR XCAPSL CTRC RMVL INSJ IO LENS PROSTH W/O ECP Right 12/03/2016    Family History  Problem Relation Age of Onset  . Diabetes Father   . Diabetes Brother   . Kidney disease Brother    Social History:  reports that he has quit smoking. He has never used smokeless tobacco. He reports previous alcohol use. He reports  previous drug use.  Allergies: No Known Allergies  Medications: Prior to Admission:  No current facility-administered medications on file prior to encounter.    Current Outpatient Medications on File Prior to Encounter  Medication Sig Dispense Refill  . aspirin 81 MG EC tablet Take 1 tablet (81 mg total) by mouth daily. IM program 30 tablet 3  . atorvastatin (LIPITOR) 40 MG tablet Take 1 tablet (40 mg total) by mouth daily at 6 PM. 30 tablet 3  . glipiZIDE (GLUCOTROL XL) 5 MG 24 hr tablet Take 1 tablet (5 mg total) by mouth daily with breakfast. 60 tablet 3  . hydrALAZINE (APRESOLINE) 100 MG tablet Take 0.5 tablets (50 mg total) by mouth 2 (two) times daily. 60 tablet 3  . amLODipine (NORVASC) 5 MG tablet Take 1 tablet (5 mg total) by mouth daily for 30 days. 30 tablet 3  . gabapentin (NEURONTIN) 100 MG capsule Take 1 capsule (100 mg total) by mouth 3 (three) times daily. 90 capsule 3  . labetalol (NORMODYNE) 200 MG tablet Take 1 tablet (200 mg total) by mouth 3 (three) times daily. 90 tablet 3     ROS: Denies headache or new vision changes. No trouble with confusion or understanding speech. Has occasional chronic neck discomfort. No CP or SOB. No vomiting. No fever. No abdominal pain currently. No limb pain. No incoordination. No bowel or bladder incontinence. Other ROS as per HPI.  Physical Examination: Blood pressure (!) 173/94, pulse 73, temperature 97.9 F (36.6 C), temperature source [place], resp. rate 12, height 5\' 5"  (1.651 m), weight 80.3 kg, SpO2 99 %.  HEENT: [place]/AT Lungs: Respirations unlabored Ext: Warm and well perfused  Neurologic Examination: Mental Status: Alert, fully oriented, thought content appropriate.  Speech fluent with intact comprehension of all commands and questions. Able to name objects. No dysarthria currently.  Cranial Nerves: II:  Visual fields intact without extinction to DSS. Right pupil reactive and slightly irregular 3 mm >> 2 mm. Left pupil reactive  and round 3 mm >> 2 mm.  III,IV, VI: No ptosis. EOMI with saccadic pursuits noted. No nystagmus.   V,VII: No facial droop. Temp sensation equal bilaterally VIII: hearing intact to voice IX,X: Palate rises symmetrically XI: Symmetric shoulder shrug XII: midline tongue extension  Motor: BUE 5/5 proximally and distally with normal tone and bulk RLE 4/5 hip and knee flexion, otherwise 5/5 LLE 5/5 proximal and distal No pronator drift Sensory: Temp and light touch intact in all 4 extremities. No extinction.  Deep Tendon Reflexes:  2+ bilateral brachioradialis and biceps. Trace patellar reflexes. 0 achilles bilaterally. Right toe upgoing, left toe downgoing.  Cerebellar: No ataxia with FNF bilaterally  Gait: Deferred  Results for orders placed or performed during the hospital encounter of 04/22/18 (from the past 48 hour(s))  CBC with Differential     Status: Abnormal   Collection Time: 04/22/18  6:11 PM  Result Value Ref Range   WBC 5.4 4.0 - 10.5 K/uL   RBC 3.60 (L) 4.22 - 5.81 MIL/uL   Hemoglobin 9.8 (L) 13.0 - 17.0 g/dL   HCT 29.9 (L) 39.0 - 52.0 %   MCV 83.1 80.0 - 100.0 fL   MCH 27.2 26.0 - 34.0 pg   MCHC 32.8 30.0 - 36.0 g/dL   RDW 13.2 11.5 - 15.5 %   Platelets 254 150 - 400 K/uL   nRBC 0.0 0.0 - 0.2 %   Neutrophils Relative % 65 %   Neutro Abs 3.5 1.7 - 7.7 K/uL   Lymphocytes Relative 19 %   Lymphs Abs 1.0 0.7 - 4.0 K/uL   Monocytes Relative 11 %   Monocytes Absolute 0.6 0.1 - 1.0 K/uL   Eosinophils Relative 4 %   Eosinophils Absolute 0.2 0.0 - 0.5 K/uL   Basophils Relative 1 %   Basophils Absolute 0.0 0.0 - 0.1 K/uL   Immature Granulocytes 0 %   Abs Immature Granulocytes 0.02 0.00 - 0.07 K/uL    Comment: Performed at [facility], [address], [place], [place] [address]  Comprehensive metabolic panel     Status: Abnormal   Collection Time: 04/22/18  6:11 PM  Result Value Ref Range   Sodium 133 (L) 135 - 145 mmol/L   Potassium 5.7 (H) 3.5 - 5.1 mmol/L     Comment: SPECIMEN HEMOLYZED. HEMOLYSIS MAY AFFECT INTEGRITY OF RESULTS.   Chloride 104 98 - 111 mmol/L   CO2 20 (L) 22 - 32 mmol/L   Glucose, Bld 182 (H) 70 - 99 mg/dL   BUN 37 (H) 6 - 20 mg/dL   Creatinine, Ser 4.19 (H) 0.61 - 1.24 mg/dL   Calcium 8.1 (L) 8.9 - 10.3 mg/dL   Total Protein 5.8 (L) 6.5 - 8.1 g/dL   Albumin 2.7 (L) 3.5 - 5.0 g/dL   AST 40 15 - 41 U/L   ALT 25 0 - 44 U/L   Alkaline Phosphatase 65 38 - 126  U/L   Total Bilirubin 1.0 0.3 - 1.2 mg/dL   GFR calc non Af Amer 15 (L) >60 mL/min   GFR calc Af Amer 18 (L) >60 mL/min   Anion gap 9 5 - 15    Comment: Performed at [facility] [address], [place], [place] [address]  APTT     Status: None   Collection Time: 04/22/18  6:11 PM  Result Value Ref Range   aPTT 30 24 - 36 seconds    Comment: Performed at [facility] [address] [place], [place] [address]  Protime-INR     Status: None   Collection Time: 04/22/18  6:11 PM  Result Value Ref Range   Prothrombin Time 12.7 11.4 - 15.2 seconds   INR 0.96     Comment: Performed at [facility] [address], [place], [place] [address]  Ethanol     Status: None   Collection Time: 04/22/18  7:40 PM  Result Value Ref Range   Alcohol, Ethyl (B) <10 <10 mg/dL    Comment: (NOTE) Lowest detectable limit for serum alcohol is 10 mg/dL. For medical purposes only. Performed at [facility], [place] [address], [place], [place] [address]    Mr Brain 52 Contrast  Result Date: 04/22/2018 CLINICAL DATA:  53 y/o M; episode of slurred speech, facial droop, left-sided weakness. TIA, initial exam. EXAM: MRI HEAD WITHOUT CONTRAST TECHNIQUE: Multiplanar, multiecho pulse sequences of the brain and surrounding structures were obtained without intravenous contrast. COMPARISON:  03/10/2018 CT head, MRI head, MRA head. FINDINGS: Brain: Single punctate foci of reduced diffusion within the right parietal periventricular white matter compatible with acute/early subacute  infarction (series 5 image 81). No associated hemorrhage or mass effect. There are several foci of faint diffusion hyperintensity with intermediate ADC scattered throughout the brain corresponding to infarctions on the prior MRI. Stable early confluent nonspecific T2 FLAIR hyperintensities in subcortical and periventricular white matter are compatible with moderate chronic microvascular ischemic changes. Stable volume loss of the brain. Stable small chronic infarction within the right inferolateral frontal cortex. No extra-axial collection, hydrocephalus, mass effect, or herniation. Vascular: Normal flow voids. Skull and upper cervical spine: Normal marrow signal. Sinuses/Orbits: Negative. Other: Right intra-ocular lens replacement. IMPRESSION: 1. Punctate acute/early subacute infarction within the right parietal periventricular white matter. No hemorrhage or mass effect. 2. Stable moderate chronic microvascular ischemic changes and stable volume loss of the brain. Several scattered punctate now chronic infarctions are present stable in distribution from prior MRI. These results were called by telephone at the time of interpretation on 04/22/2018 at 9:13 pm to PA [facility] , who verbally acknowledged these results. Electronically Signed   By: Kristine Garbe M.D.   On: 04/22/2018 21:17    MRI/MRA of head 03/10/18 (prior admission): Multiple intracranial findings that may benefit from CT angiographic evaluation. Possible acute or subacute occlusion of the A1 segment on the right. Question aneurysm of the anterior communicating artery region measuring up to 3 mm in size. Narrowing and irregularity of the M1 segment on the right, estimated severity about 20%. Abnormal appearance of the proximal basilar artery raising the possibility of a localized dissection. 50% stenosis in region.50% stenosis of the P2 segment on the right.  Carotid ultrasound 03/11/18: Right Carotid: Velocities in the right ICA are  consistent with a 1-39% stenosis. Left Carotid: Velocities in the left ICA are consistent with a 40-59% stenosis. Vertebrals: Bilateral vertebral arteries demonstrate antegrade flow.  Echocardiogram 03/10/18: Normal LV size with mild LV hypertrophy. EF 55-60%. Normal RV size and systolic function. No significant valvular abnormalities.  Assessment: 53 y.o. male presenting with transient left facial droop, dysarthria, left perioral, hand and trunk numbness 1. Neurological exam with right upgoing toe and mild RLE weakness. These findings do not correlate with the acute punctate right periventricular white matter ischemic stroke seen on MRI. 2. MRI brain: Punctate acute/early subacute infarction within the right parietal periventricular white matter. No hemorrhage or mass effect. Stable moderate chronic microvascular ischemic changes and stable volume loss of the brain. Several scattered punctate now chronic infarctions are present which are stable in distribution from prior MRI.  3. Elevated Cr.  4. Stroke Risk Factors - DM, prior stroke, HTN and HLD 5. Renal impairment. Risks of contrast administration for CTA most likely outweigh limited additional diagnostic information obtainable from this modality. MRA in January revealed findings most consistent with intravascular atherosclerotic disease 6. The new and old strokes on MRI appear most likely to be secondary to chronic hypertensive microangiopathy and less likely to be cardioembolic 7. Classifiable as having failed ASA monotherapy  Recommendations: 1. Given renal impairment, risks of contrast administration for CTA most likely outweigh limited additional diagnostic information obtainable from this modality. 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 Consider TEE. Stroke team to make final decision in the AM 3. PT consult, OT consult, Speech consult 4. Glucose control 6. Prophylactic therapy-  Add Plavix to ASA 7. Continue atorvastatin 8. Telemetry monitoring 9. Frequent neuro checks 10. Permissive HTN x 24 hours 11. IV hydration and further assessment of CKD  @Electronically  signed: Dr. Kerney Elbe 04/22/2018, 9:52 PM

## 2018-04-22 NOTE — Progress Notes (Signed)
Max Sanchez, is a 53 y.o. male  TGG:[id number]  [id number]  DOB - 09-02-1965  CC:  Chief Complaint  Patient presents with  . Establish Care  . Diabetes  . Hypertension  . Hyperlipidemia       HPI: Max Sanchez is a 53 y.o. male is here today to establish care.   Max Sanchez has Hypertension; CKD (chronic kidney disease), stage III ([place]); Acute-on-chronic kidney injury ([place]); Proteinuria; Prolonged Q-T interval on ECG; History of CVA (cerebrovascular accident); Hyperlipidemia; and Insomnia on their problem list.    Today's visit:  Diabetes Mellitus w/ CKD IV: Patient presents for follow up of diabetes. patient suffers from diabetes that of recent has regained improved control. Most recent A1C 7.3 Symptoms: paresthesia of the feet and at time severe burning. Symptoms of foot pain worsen at bedtime and have waxed and waned. Patient denies increase appetite, nausea and polydipsia. No home monitoring of blood sugars. Treatment to date: previously prescribed metformin and glipizide. Metformin was discontinue due to worsening kidney function. He is on prescribed glipizide 5 mg once daily.   Hypertension, accelerated, uncontrolled. Max Sanchez reports home monitoring of blood pressure. BP elevated today 177/103 on arrival. Reports he is taking all medications as prescribed. He has brought all medications to his visit today. He completed Plavix. Continues to take aspirin. He doesn't eat a sodium restricted diet. He is a former smoker. Denies any episodes of dizziness, headaches, shortness of breath, or chest pain. He has a history of retinal detachment right eye  due to uncontrolled blood pressure . He was followed by [facility] ophthalmology, although has recently seen an eye doctor here in [place] but doesn't recall the name.   Current medications: Current Outpatient Medications:  .  amLODipine (NORVASC) 5 MG tablet, Take 1 tablet (5 mg total) by mouth daily for 30 days., Disp: 30  tablet, Rfl: 0 .  aspirin EC 81 MG EC tablet, Take 1 tablet (81 mg total) by mouth daily. IM program, Disp: 30 tablet, Rfl: 0 .  atorvastatin (LIPITOR) 40 MG tablet, Take 1 tablet (40 mg total) by mouth daily at 6 PM., Disp: 30 tablet, Rfl: 0 .  clopidogrel (PLAVIX) 75 MG tablet, Take 1 tablet (75 mg total) by mouth daily., Disp: 19 tablet, Rfl: 0 .  glipiZIDE (GLUCOTROL XL) 5 MG 24 hr tablet, Take 1 tablet (5 mg total) by mouth daily with breakfast., Disp: 60 tablet, Rfl: 0 .  labetalol (NORMODYNE) 200 MG tablet, Take 1 tablet (200 mg total) by mouth 3 (three) times daily., Disp: 90 tablet, Rfl: 0   Pertinent family medical history: family history includes Diabetes in his brother and father; Kidney disease in his brother.   No Known Allergies  Social History   Socioeconomic History  . Marital status: Married    Spouse name: Not on file  . Number of children: Not on file  . Years of education: Not on file  . Highest education level: Not on file  Occupational History  . Not on file  Social Needs  . Financial resource strain: Not on file  . Food insecurity:    Worry: Not on file    Inability: Not on file  . Transportation needs:    Medical: Not on file    Non-medical: Not on file  Tobacco Use  . Smoking status: Former Research scientist (life sciences)  . Smokeless tobacco: Never Used  . Tobacco comment: 10 pack year history; quit 2 years ago  Substance and Sexual Activity  . Alcohol use:  Not Currently    Comment: stopped 2 years ago  . Drug use: Not Currently    Comment: tried cocaine and marijuana when younger  . Sexual activity: Not on file  Lifestyle  . Physical activity:    Days per week: Not on file    Minutes per session: Not on file  . Stress: Not on file  Relationships  . Social connections:    Talks on phone: Not on file    Gets together: Not on file    Attends religious service: Not on file    Active member of club or organization: Not on file    Attends meetings of clubs or  organizations: Not on file    Relationship status: Not on file  . Intimate partner violence:    Fear of current or ex partner: Not on file    Emotionally abused: Not on file    Physically abused: Not on file    Forced sexual activity: Not on file  Other Topics Concern  . Not on file  Social History Narrative  . Not on file    Review of Systems: Pertinent negatives listed in HPI Objective:   Vitals:   04/22/18 1218 04/22/18 1219  BP: (!) 184/106 (!) 165/97  Pulse:    Resp:    SpO2:      BP Readings from Last 3 Encounters:  03/19/18 (!) 167/89  03/12/18 (!) 163/98  08/29/15 (!) 170/110    Filed Weights   04/22/18 1217  Weight: 177 lb 6.4 oz (80.5 kg)      Physical Exam: Constitutional: Patient appears well-developed and well-nourished. No distress. HENT: Normocephalic, atraumatic, External right and left ear normal. Oropharynx is clear and moist.  Eyes: Conjunctivae and EOM are normal. PERRLA, no scleral icterus. Neck: Normal ROM. Neck supple. No JVD. No tracheal deviation. No thyromegaly. CVS: RRR, S1/S2 +, no murmurs, no gallops, no carotid bruit.  Pulmonary: Effort and breath sounds normal, no stridor, rhonchi, wheezes, rales.  Abdominal: Soft. BS +, no distension, tenderness, rebound or guarding.  Musculoskeletal: Normal range of motion. No edema and no tenderness.  Neuro: Alert. Normal muscle tone coordination. Normal gait. BUE and BLE strength 5/5. Bilateral hand grips symmetrical. Skin: Skin is warm and dry. No rash noted. Not diaphoretic. No erythema. No pallor. Psychiatric: Normal mood and affect. Behavior, judgment, thought content normal.  Lab Results (prior encounters)  Lab Results  Component Value Date   WBC 5.6 03/12/2018   HGB 10.2 (L) 03/12/2018   HCT 30.3 (L) 03/12/2018   MCV 82.8 03/12/2018   PLT 241 03/12/2018   Lab Results  Component Value Date   CREATININE 4.04 (H) 03/12/2018   BUN 39 (H) 03/12/2018   NA 134 (L) 03/12/2018   K 4.2  03/12/2018   CL 102 03/12/2018   CO2 23 03/12/2018    Lab Results  Component Value Date   HGBA1C 7.3 (H) 03/10/2018       Component Value Date/Time   CHOL 359 (H) 03/10/2018 0618   TRIG 312 (H) 03/10/2018 0618   HDL 42 03/10/2018 0618   CHOLHDL 8.5 03/10/2018 0618   VLDL 62 (H) 03/10/2018 0618   LDLCALC 255 (H) 03/10/2018 0618       Assessment and plan:  1. Chronic kidney disease (CKD), stage IV (severe) (HCC) -Needs close follow-up with nephrology. Most recent creatinine 4.04.  Rechecking A1C and renal function which was recently checked 03/19/18 Patient did not follow-up at prior medical office within twice weeks  as recommended.   2. Uncontrolled type 2 diabetes mellitus without complication, without long-term current use of insulin (HCC)  - Microalbumin/Creatinine Ratio, Urine - Hemoglobin A1c - Comprehensive metabolic panel  3. Essential hypertension, accelerate x multiple readings today. History of very poor control He took all BP medications approximately an hours prior of visit. Patient given cloNIDine (CATAPRES) tablet 0.1 mg to reduce blood pressure. I have added hydralazine 50 mg twice daily, and continue labetalol (NORMODYNE) 200 MG tablet; Take 1 tablet (200 mg total) by mouth 3 (three) times daily.   No Ace/Arb given current renal function. -consider metoprolol and or carvedilol if BP remains elevated. Checking  - Comprehensive metabolic panel   4. Need for Tdap vaccination - Tdap vaccine greater than or equal to 7yo IM  Return for 1 week BP and 4 weeks HTN follow-up .   The patient was given clear instructions to go to ER or return to medical center if symptoms don't improve, worsen or new problems develop. The patient verbalized understanding. The patient was advised  to call and obtain lab results if they haven't heard anything from out office within 7-10 business days.  Molli Barrows, FNP Primary Care at [facility] [address]  [place] [address] [phone]: [phone]    This note has been created with Dragon speech recognition software and Engineer, materials. Any transcriptional errors are unintentional.

## 2018-04-22 NOTE — ED Notes (Signed)
Patient able to ambulate independently  

## 2018-04-22 NOTE — Discharge Instructions (Signed)
Recommending further evaluation and management in the ED for possible TIA.  Patient and daughter aware and in agreement with plan. EMS transferred patient to [facility] ED.

## 2018-04-22 NOTE — ED Notes (Signed)
Patient transported to MRI 

## 2018-04-22 NOTE — ED Triage Notes (Signed)
[facility]. LKN 1530 patient started having slurred speech, facial droop and left sided weakness. These symptoms all resolved by 1600 while at UC. AOx4. Patient here 03/09/18 for a previous stroke.   Last VS per EMS CBG 219. BP 172/96 HR 71 RR 18

## 2018-04-22 NOTE — Patient Instructions (Addendum)
  Max Sanchez por elegir Atencin Primaria en [place] para su hogar mdico!  Max Sanchez fue visto por Molli Barrows, FNP hoy.  El mdico de atencin primaria de [place], [place], [place]. Para obtener la mejor atencin posible, debe intentar ver a Molli Barrows, FNP-C cada vez que vienes a la clnica.  Esperamos verte pronto!  Si tiene [employer] su visita hoy, por favor llmenos al [phone]  O no dude en comunicarse con su proveedor a [employer].   Para rellenar medicamentos: Comunquese con su farmacia y su farmacia se encargar de toda la solicitud de Chief Executive Officer. Al llamar a la farmacia, por favor escuche el mensaje automatizado para obtener instrucciones sobre cmo rellenar su medicamento. Para evitar retrasos, solicite recargas de medicamentos en al menos 5 das despus de completar la ltima dosis.

## 2018-04-23 DIAGNOSIS — E119 Type 2 diabetes mellitus without complications: Secondary | ICD-10-CM

## 2018-04-23 DIAGNOSIS — N184 Chronic kidney disease, stage 4 (severe): Secondary | ICD-10-CM

## 2018-04-23 DIAGNOSIS — N179 Acute kidney failure, unspecified: Secondary | ICD-10-CM

## 2018-04-23 LAB — COMPREHENSIVE METABOLIC PANEL
ALT: 18 IU/L (ref 0–44)
AST: 17 IU/L (ref 0–40)
Albumin/Globulin Ratio: 1.4 (ref 1.2–2.2)
Albumin: 3.5 g/dL — ABNORMAL LOW (ref 3.8–4.9)
Alkaline Phosphatase: 85 IU/L (ref 39–117)
BUN/Creatinine Ratio: 8 — ABNORMAL LOW (ref 9–20)
BUN: 33 mg/dL — ABNORMAL HIGH (ref 6–24)
Bilirubin Total: 0.5 mg/dL (ref 0.0–1.2)
CO2: 20 mmol/L (ref 20–29)
CREATININE: 3.91 mg/dL — AB (ref 0.76–1.27)
Calcium: 8.2 mg/dL — ABNORMAL LOW (ref 8.7–10.2)
Chloride: 99 mmol/L (ref 96–106)
GFR calc Af Amer: 19 mL/min/{1.73_m2} — ABNORMAL LOW (ref >59)
GFR, EST NON AFRICAN AMERICAN: 17 mL/min/{1.73_m2} — AB (ref >59)
Globulin, Total: 2.5 g/dL (ref 1.5–4.5)
Glucose: 194 mg/dL — ABNORMAL HIGH (ref 65–99)
Potassium: 4.6 mmol/L (ref 3.5–5.2)
Sodium: 135 mmol/L (ref 134–144)
TOTAL PROTEIN: 6 g/dL (ref 6.0–8.5)

## 2018-04-23 LAB — GLUCOSE, CAPILLARY: Glucose-Capillary: 124 mg/dL — ABNORMAL HIGH (ref 70–99)

## 2018-04-23 LAB — HEMOGLOBIN A1C
Est. average glucose Bld gHb Est-mCnc: 146 mg/dL
Hgb A1c MFr Bld: 6.7 % — ABNORMAL HIGH (ref 4.8–5.6)

## 2018-04-23 MED ORDER — AMLODIPINE BESYLATE 5 MG PO TABS: 5.0000 mg | ORAL_TABLET | Freq: Every day | ORAL | 0 refills | Status: DC

## 2018-04-23 MED ORDER — ASPIRIN 81 MG PO TBEC: 81.0000 mg | DELAYED_RELEASE_TABLET | Freq: Every day | ORAL | 0 refills | Status: AC

## 2018-04-23 MED ORDER — HYDRALAZINE HCL 100 MG PO TABS: 50.0000 mg | ORAL_TABLET | Freq: Two times a day (BID) | ORAL | 0 refills | Status: DC

## 2018-04-23 MED ORDER — CLOPIDOGREL BISULFATE 75 MG PO TABS: 75.0000 mg | ORAL_TABLET | Freq: Every day | ORAL | 1 refills | Status: DC

## 2018-04-23 MED ORDER — HYDRALAZINE HCL 20 MG/ML IJ SOLN: 5.0000 mg | INTRAMUSCULAR | Status: DC | PRN

## 2018-04-23 MED ORDER — LABETALOL HCL 200 MG PO TABS: 200.0000 mg | ORAL_TABLET | Freq: Three times a day (TID) | ORAL | 3 refills | Status: DC

## 2018-04-23 MED ORDER — CLOPIDOGREL BISULFATE 75 MG PO TABS: 75.0000 mg | ORAL_TABLET | Freq: Every day | ORAL | 3 refills | Status: AC

## 2018-04-23 MED FILL — CLOPIDOGREL 75 MG TABLET: 75 | 30 days supply | Qty: 30 | Fill #0

## 2018-04-23 NOTE — Progress Notes (Addendum)
D/C instructions provided to patient in [place] via Laverda Sorenson, Agricultural consultant, denies questions/concerns at this time. Patient transported to front entrance via [place] by volunteer services, tol well.

## 2018-04-23 NOTE — Progress Notes (Addendum)
STROKE TEAM PROGRESS NOTE   INTERVAL HISTORY His daughter is at the bedside.  His symptoms from yesterday have now resolved. She has been working with him on his health and risk factor modification. History of presenting illness was reviewed with the patient and his daughter who translated for him.   Vitals:   04/23/18 0745 04/23/18 0800 04/23/18 0822 04/23/18 1000  BP: (!) 142/71 (!) 158/83 (!) 178/91 (!) 171/89  Pulse: 71 73 74 74  Resp:  16 13 14   Temp:   97.9 F (36.6 C)   TempSrc:   Oral   SpO2: 94% 98% 99% 100%  Weight:      Height:        CBC:  Recent Labs  Lab 04/22/18 1811  WBC 5.4  NEUTROABS 3.5  HGB 9.8*  HCT 29.9*  MCV 83.1  PLT 381    Basic Metabolic Panel:  Recent Labs  Lab 04/22/18 1130 04/22/18 1811 04/22/18 2142  NA 135 133*  --   K 4.6 5.7* 3.9  CL 99 104  --   CO2 20 20*  --   GLUCOSE 194* 182*  --   BUN 33* 37*  --   CREATININE 3.91* 4.19*  --   CALCIUM 8.2* 8.1*  --    Lipid Panel:     Component Value Date/Time   CHOL 359 (H) 03/10/2018 0618   TRIG 312 (H) 03/10/2018 0618   HDL 42 03/10/2018 0618   CHOLHDL 8.5 03/10/2018 0618   VLDL 62 (H) 03/10/2018 0618   LDLCALC 255 (H) 03/10/2018 0618   HgbA1c:  Lab Results  Component Value Date   HGBA1C 6.7 (H) 04/22/2018   Urine Drug Screen:     Component Value Date/Time   LABOPIA NONE DETECTED 03/10/2018 0612   COCAINSCRNUR NONE DETECTED 03/10/2018 0612   LABBENZ NONE DETECTED 03/10/2018 0612   AMPHETMU NONE DETECTED 03/10/2018 0612   THCU NONE DETECTED 03/10/2018 0612   LABBARB NONE DETECTED 03/10/2018 0612    Alcohol Level     Component Value Date/Time   ETH <10 04/22/2018 1940    IMAGING Mr Brain Wo Contrast  Result Date: 04/22/2018 CLINICAL DATA:  53 y/o M; episode of slurred speech, facial droop, left-sided weakness. TIA, initial exam. EXAM: MRI HEAD WITHOUT CONTRAST TECHNIQUE: Multiplanar, multiecho pulse sequences of the brain and surrounding structures were obtained  without intravenous contrast. COMPARISON:  03/10/2018 CT head, MRI head, MRA head. FINDINGS: Brain: Single punctate foci of reduced diffusion within the right parietal periventricular white matter compatible with acute/early subacute infarction (series 5 image 81). No associated hemorrhage or mass effect. There are several foci of faint diffusion hyperintensity with intermediate ADC scattered throughout the brain corresponding to infarctions on the prior MRI. Stable early confluent nonspecific T2 FLAIR hyperintensities in subcortical and periventricular white matter are compatible with moderate chronic microvascular ischemic changes. Stable volume loss of the brain. Stable small chronic infarction within the right inferolateral frontal cortex. No extra-axial collection, hydrocephalus, mass effect, or herniation. Vascular: Normal flow voids. Skull and upper cervical spine: Normal marrow signal. Sinuses/Orbits: Negative. Other: Right intra-ocular lens replacement. IMPRESSION: 1. Punctate acute/early subacute infarction within the right parietal periventricular white matter. No hemorrhage or mass effect. 2. Stable moderate chronic microvascular ischemic changes and stable volume loss of the brain. Several scattered punctate now chronic infarctions are present stable in distribution from prior MRI. These results were called by telephone at the time of interpretation on 04/22/2018 at 9:13 pm to [place] ,  who verbally acknowledged these results. Electronically Signed   By: Kristine Garbe M.D.   On: 04/22/2018 21:17    PHYSICAL EXAM Pleasant middle-aged Hispanic male currently not in distress. . Afebrile. Head is nontraumatic. Neck is supple without bruit.    Cardiac exam no murmur or gallop. Lungs are clear to auscultation. Distal pulses are well felt. Neurological Exam ;  Awake  Alert oriented x 3. Normal speech and language.eye movements full without nystagmus.fundi were not visualized. Vision  acuity and fields appear normal. Hearing is normal. Palatal movements are normal. Face symmetric. Tongue midline. Normal strength, tone, reflexes and coordination. Normal sensation. Gait deferred.   ASSESSMENT/PLAN Max Sanchez is a 53 y.o. male with history of stroke in jan 2020, CKD, DB, HTN, HLD presenting with left facial droop, slurred speech, patchy left sided numbness and left sided weakness. 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 Stable small vessel disease and atrophy. Scattered punctate old infarcts.  LDL 62  HgbA1c 6.7  Heparin 5000 units sq tid for VTE prophylaxis  aspirin 81 mg daily prior to admission, now on aspirin 81 mg daily. Given recurrent mild stroke, recommend aspirin 81 mg and plavix 75 mg daily x 3 weeks, then PLAVIX alone.   Therapy recommendations:  No therapy needs  Disposition:  Return home  Hypertension  Stable . BP goal normotensive  Hyperlipidemia  Home meds:  lipitor 40, resumed in hospital  LDL 62, goal < 70  Continue statin at discharge  Diabetes type II  HgbA1c down to 6.7 from 7.3 in Jan, now at goal < 7.0  Other Stroke Risk Factors  Has stopped smoking Cigarettes since last stroke  Has stopped using  ETOH since last stroke  Obesity, Body mass index is 29.45 kg/m., recommend weight loss, diet and exercise as appropriate   Hx stroke/TIA  03/2018 - Scattered numerous punctate infarcts within both cerebral hemispheres due to small vessel disease. Put on DAPT x 3 weeks.    [place] for d/c from stroke standpoint  Follow-up Stroke Clinic at [facility] in 4 weeks. Office will call with appointment date and time. Order placed.  NOTHING FURTHER TO ADD FROM THE STROKE STANDPOINT  Patient has a 10-15% risk of having another stroke over the next year, the highest risk is  within 2 weeks of the most recent stroke/TIA. Unfortunately this occurred, even though it is clear he has improved his risk factor control.   Ongoing risk factor control by Primary Care Physician  Stroke Service will sign off. Please call should any needs arise.  Follow-up Stroke Clinic at [facility] in 4 weeks, order placed.    Hospital day # 0  Burnetta Sabin, MSN, APRN, ANVP-BC, AGPCNP-BC Advanced Practice Stroke Nurse [place] for Schedule & Pager information 04/23/2018 10:56 AM  I have personally obtained history,examined this patient, reviewed notes, independently viewed imaging studies, participated in medical decision making and plan of care.ROS completed by me personally and pertinent positives fully documented  I have made any additions or clarifications directly to the above note. Agree with note above. He presented with transient left facial droop and left-sided numbness due to small right parietal white matter lacunar infarct. This is recurrent as he had recent lacunar infarcts month ago and had extensive evaluation. Recommend continue dual antiplatelet therapy for 3 weeks followed by  Alex alone. Long discussion with patient and daughter at the bedside and answered questions. Discussed with Dr. Erlinda Hong. Greater than 50% time during this 25 minute visit was spent on counseling and coordination of care of his recurrent lacunar infarcts and on some questions and discussing the importance of aggressive risk factor modification.  Antony Contras, MD Medical Director [facility] Pager: [phone] 04/23/2018 1:17 PM  To contact Stroke Continuity provider, please refer to [email or web address] After hours, contact General Neurology

## 2018-04-23 NOTE — Evaluation (Signed)
Physical Therapy Evaluation Patient Details Name: Max Sanchez MRN: [id number] DOB: 01/21/1966 Today's Date: 04/23/2018   History of Present Illness  53 y.o. male with medical history significant of hypertension, hyperlipidemia, diabetes mellitus, recent stroke, CKD-3, who presents with left-sided weakness, numbness, slurred speech and facial droop. MRI revealed punctate acute/early subacute infarction within the right parietal periventricular white matter.     Clinical Impression  PT eval complete. Pt demonstrated independence with bed mobility and transfers. Supervision provided for ambulation 250 feet without AD. No LOB or instability noted. Pt and daughter report pt is at his baseline. No questions or concerns regarding mobility at home. No further skilled PT intervention indicated. PT signing off.    Follow Up Recommendations No PT follow up    Equipment Recommendations  None recommended by PT    Recommendations for Other Services       Precautions / Restrictions Precautions Precautions: None      Mobility  Bed Mobility Overal bed mobility: Independent                Transfers Overall transfer level: Modified independent Equipment used: None                Ambulation/Gait Ambulation/Gait assistance: Supervision Gait Distance (Feet): 250 Feet Assistive device: None Gait Pattern/deviations: Step-through pattern;Decreased stride length Gait velocity: WFL Gait velocity interpretation: >2.62 ft/sec, indicative of community ambulatory General Gait Details: No instability or LOB noted. Difficult to assess higher level balance activities due to language barrier.   Stairs            Wheelchair Mobility    Modified Rankin (Stroke Patients Only) Modified Rankin (Stroke Patients Only) Pre-Morbid Rankin Score: No symptoms Modified Rankin: No significant disability     Balance Overall balance assessment: Needs assistance Sitting-balance support:  Feet supported;No upper extremity supported Sitting balance-Leahy Scale: Good     Standing balance support: No upper extremity supported;During functional activity Standing balance-Leahy Scale: Good                               Pertinent Vitals/Pain Pain Assessment: No/denies pain    Home Living Family/patient expects to be discharged to:: Private residence Living Arrangements: Children Available Help at Discharge: Family;Available 24 hours/day Type of Home: Apartment Home Access: Level entry     Home Layout: One level Home Equipment: None      Prior Function Level of Independence: Independent         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, newspaper        Extremity/Trunk Assessment   Upper Extremity Assessment Upper Extremity Assessment: Overall WFL for tasks assessed    Lower Extremity Assessment Lower Extremity Assessment: LLE deficits/detail LLE Deficits / Details: mild hip weakness 4/5, not affecting functional mobility    Cervical / Trunk Assessment Cervical / Trunk Assessment: Normal  Communication   Communication: Prefers language other than English;Interpreter utilized(daughter utilized as Astronomer )  Cognition Arousal/Alertness: Awake/alert Behavior During Therapy: WFL for tasks assessed/performed Overall Cognitive Status: Within Functional Limits for tasks assessed                                 General Comments: Daughter reports pt at baseline cognitively.      General Comments General comments (skin integrity, edema, etc.): Pt daughter present and interpreting during session.    Exercises  Assessment/Plan    PT Assessment Patent does not need any further PT services  PT Problem List         PT Treatment Interventions      PT Goals (Current goals can be found in the Care Plan section)  Acute Rehab PT Goals Patient Stated Goal: home PT Goal Formulation: All assessment and education complete, DC  therapy    Frequency     Barriers to discharge        Co-evaluation               AM-PAC PT "6 Clicks" Mobility  Outcome Measure Help needed turning from your back to your side while in a flat bed without using bedrails?: None Help needed moving from lying on your back to sitting on the side of a flat bed without using bedrails?: None Help needed moving to and from a bed to a chair (including a wheelchair)?: None Help needed standing up from a chair using your arms (e.g., wheelchair or bedside chair)?: None Help needed to walk in hospital room?: None Help needed climbing 3-5 steps with a railing? : A Little 6 Click Score: 23    End of Session Equipment Utilized During Treatment: Gait belt Activity Tolerance: Patient tolerated treatment well Patient left: in bed;with call bell/phone within reach;with family/visitor present Nurse Communication: Mobility status PT Visit Diagnosis: Other symptoms and signs involving the nervous system (R29.898)    Time: 8101-7510 PT Time Calculation (min) (ACUTE ONLY): 18 min   Charges:   PT Evaluation $PT Eval Moderate Complexity: 1 Mod          Lorrin Goodell, PT  Office # [phone] Pager [phone]   Lorriane Shire 04/23/2018, 11:11 AM

## 2018-04-23 NOTE — Plan of Care (Signed)
Patient stable, discussed POC with patient, agreeable with plan for D/C, denies question/concerns at this time.

## 2018-04-23 NOTE — Discharge Summary (Signed)
Discharge Summary  Max Sanchez UMP:[id number] DOB: May 06, 1965  PCP: Scot Jun, FNP  Admit date: 04/22/2018 Discharge date: 04/23/2018  Time spent: 88mins, case discussed with neurology  Recommendations for Outpatient Follow-up:  1. F/u with PCP within a week  for hospital discharge follow up, repeat cbc/bmp at follow up. pcp to refer patient to have outpatient sleep study, pcp to refer patient to nephrology 2. F/u with neurology  Discharge Diagnoses:  Active Hospital Problems   Diagnosis Date Noted  . Stroke ([place]) 04/22/2018  . Type II diabetes mellitus with renal manifestations ([place]) 04/22/2018  . Hyperkalemia 04/22/2018  . Hyperlipidemia 03/19/2018  . Hypertension 03/10/2018  . History of CVA (cerebrovascular accident) 03/10/2018  . CKD (chronic kidney disease), stage III ([place]) 03/10/2018    Resolved Hospital Problems  No resolved problems to display.    Discharge Condition: stable  Diet recommendation: heart healthy/carb modified  Filed Weights   04/22/18 1734  Weight: 80.3 kg    History of present illness: (per admitting MD Dr Blaine Hamper) PCP: Scot Jun, FNP   Patient coming from:  The patient is coming from home.  At baseline, pt is independent for most of ADL.        Chief Complaint: Left-sided weakness and numbness, slurred speech, facial droop  HPI: Max Sanchez is a 53 y.o. male with medical history significant of hypertension, hyperlipidemia, diabetes mellitus, recent stroke, CKD-3, who presents with left-sided weakness, numbness, slurred speech and facial droop.  Patient was recently hospitalized from 1/8-1/10 due to stroke. MRI showed multiple punctate subcortical infarcts in bilateral cerebral hemisphere.  2D echo 03/10/2018 showed EF of 55 to 60% with grade 1 diastolic dysfunction. Carotid Doppler showed Right Carotid with Velocities in the right ICA are consistent with a 1-39% stenosis; and left Carotid with velocities in the left ICA  are consistent with a 40-59% stenosis; and vertebrals with bilateral vertebral arteries demonstrate antegrade flow. Neurology started him on ASA 81 and Plavix 75 mg for 3 weeks, followed by aspirin alone. He has been doing well until 15:30 when he started having left-sided weakness, numbness, slurred speech and facial droop.  Symptoms have resolved in urgent care.  Currently patient does not have any chest pain, shortness breath, cough, fever or chills.  Denies nausea vomiting, diarrhea, abdominal, symptoms of UTI.  Pt PCP is Bronwen Betters, FNP at [facility].  ED Course: pt was found to have WBC 5.4, INR 0.96, renal function slightly worsened in recent baseline, temperature normal, no tachycardia, oxygen saturation 99% on room air, blood pressure 173/94.  MRI of the brain showed punctate acute/early subacute infarction within the right parietal periventricular white matter. Pt is placed on tele bed for obs.  Dr. Cheral Marker of neurology was consulted.  MRI of brain; 1. Punctate acute/early subacute infarction within the right parietal periventricular white matter. No hemorrhage or mass effect. 2. Stable moderate chronic microvascular ischemic changes and stable volume loss of the brain. Several scattered punctate now chronic infarctions are present stable in distribution from prior MRI.   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 source per neurology -presenting symptom resolved  -plan to discharge home on asa/plavix for three weeks,  then plavix alone, rule out sleep apnea as a cause of stroke -continue statin, resume bp meds in three  days  HTN Resume bp meds in three days  noninsulin dependent DM2 a1c 6.3 Continue home meds glipzide and diet control F/u with pcp  CKDIV -cr close to baseline, follow up pcp, pcp to refer patient to nephrology     Procedures:  none  Consultations:  neurology  Discharge Exam: BP (!) 171/89   Pulse 74   Temp 97.9 F (36.6 C) (Oral)   Resp 14   Ht 5\' 5"  (1.651 m)   Wt 80.3 kg   SpO2 100%   BMI 29.45 kg/m   General: NAD Cardiovascular: RRR Respiratory: CTABl  Discharge Instructions You were cared for by a hospitalist during your hospital stay. If you have any questions about your discharge medications or the care you received while you were in the hospital after you are discharged, you can call the unit and asked to speak with the hospitalist on call if the hospitalist that took care of you is not available. Once you are discharged, your primary care physician will handle any further medical issues. Please note that NO REFILLS for any discharge medications will be authorized once you are discharged, as it is imperative that you return to your primary care physician (or establish a relationship with a primary care physician if you do not have one) for your aftercare needs so that they can reassess your need for medications and monitor your lab values.  Discharge Instructions    Ambulatory referral to Neurology   Complete by:  As directed    An appointment is requested in approximately: 4 weeks   Diet - low sodium heart healthy   Complete by:  As directed    Carb modified diet   Increase activity slowly   Complete by:  As directed      Allergies as of 04/23/2018   No Known Allergies     Medication List    TAKE these medications   amLODipine 5 MG tablet Commonly known as:  NORVASC Take 1 tablet (5 mg total) by mouth daily for 30 days. Start taking on:  April 26, 2018 What changed:  These instructions start on April 26, 2018. If you are unsure what to do  until then, ask your doctor or other care provider.   aspirin 81 MG EC tablet Take 1 tablet (81 mg total) by mouth daily for 21 days. IM program   atorvastatin 40 MG tablet Commonly known as:  LIPITOR Take 1 tablet (40 mg total) by mouth daily at 6 PM.   clopidogrel 75 MG tablet Commonly known as:  PLAVIX Take 1 tablet (75 mg total) by mouth daily for 30 days.   gabapentin 100 MG capsule Commonly known as:  NEURONTIN Take 1 capsule (100 mg total) by mouth 3 (three) times daily.   glipiZIDE 5 MG 24 hr tablet Commonly known as:  GLUCOTROL XL Take 1 tablet (5 mg total) by mouth daily with breakfast.   hydrALAZINE 100 MG tablet Commonly known as:  APRESOLINE Take 0.5 tablets (50 mg total) by mouth 2 (two) times daily. Start taking on:  April 26, 2018 What changed:  These instructions start on April 26, 2018. If you are unsure what to do until then, ask your doctor or other care provider.   labetalol 200 MG tablet Commonly known as:  NORMODYNE Take 1 tablet (200 mg total) by mouth 3 (three) times daily. Start taking  on:  April 26, 2018 What changed:  These instructions start on April 26, 2018. If you are unsure what to do until then, ask your doctor or other care provider.      No Known Allergies Follow-up Information    Garvin Fila, MD Follow up in 1 month(s).   Specialties:  Neurology, Radiology Why:  stroke follow up Contact information: [place] [address] [phone]        Scot Jun, FNP Follow up in 1 week(s).   Specialty:  Family Medicine Why:  hospital discharge follow up. pcp to refer you to sleep study to rule out sleep apnea. pcp to refer you to a nephrologist ( kidney doctor) for chronic kidney disease. Contact information: [place] [address] [phone]        please check your blood glusoce and blood pressure at home,bring in record for your docotor to review. Follow  up.            The results of significant diagnostics from this hospitalization (including imaging, microbiology, ancillary and laboratory) are listed below for reference.    Significant Diagnostic Studies: Mr Brain 60 Contrast  Result Date: 04/22/2018 CLINICAL DATA:  53 y/o M; episode of slurred speech, facial droop, left-sided weakness. TIA, initial exam. EXAM: MRI HEAD WITHOUT CONTRAST TECHNIQUE: Multiplanar, multiecho pulse sequences of the brain and surrounding structures were obtained without intravenous contrast. COMPARISON:  03/10/2018 CT head, MRI head, MRA head. FINDINGS: Brain: Single punctate foci of reduced diffusion within the right parietal periventricular white matter compatible with acute/early subacute infarction (series 5 image 81). No associated hemorrhage or mass effect. There are several foci of faint diffusion hyperintensity with intermediate ADC scattered throughout the brain corresponding to infarctions on the prior MRI. Stable early confluent nonspecific T2 FLAIR hyperintensities in subcortical and periventricular white matter are compatible with moderate chronic microvascular ischemic changes. Stable volume loss of the brain. Stable small chronic infarction within the right inferolateral frontal cortex. No extra-axial collection, hydrocephalus, mass effect, or herniation. Vascular: Normal flow voids. Skull and upper cervical spine: Normal marrow signal. Sinuses/Orbits: Negative. Other: Right intra-ocular lens replacement. IMPRESSION: 1. Punctate acute/early subacute infarction within the right parietal periventricular white matter. No hemorrhage or mass effect. 2. Stable moderate chronic microvascular ischemic changes and stable volume loss of the brain. Several scattered punctate now chronic infarctions are present stable in distribution from prior MRI. These results were called by telephone at the time of interpretation on 04/22/2018 at 9:13 pm to [place] [facility] , who  verbally acknowledged these results. Electronically Signed   By: Kristine Garbe M.D.   On: 04/22/2018 21:17    Microbiology: No results found for this or any previous visit (from the past 240 hour(s)).   Labs: Basic Metabolic Panel: Recent Labs  Lab 04/22/18 1130 04/22/18 1811 04/22/18 2142  NA 135 133*  --   K 4.6 5.7* 3.9  CL 99 104  --   CO2 20 20*  --   GLUCOSE 194* 182*  --   BUN 33* 37*  --   CREATININE 3.91* 4.19*  --   CALCIUM 8.2* 8.1*  --    Liver Function Tests: Recent Labs  Lab 04/22/18 1130 04/22/18 1811  AST 17 40  ALT 18 25  ALKPHOS 85 65  BILITOT 0.5 1.0  PROT 6.0 5.8*  ALBUMIN 3.5* 2.7*   No results for input(s): LIPASE, AMYLASE in the last 168 hours. No  results for input(s): AMMONIA in the last 168 hours. CBC: Recent Labs  Lab 04/22/18 1811  WBC 5.4  NEUTROABS 3.5  HGB 9.8*  HCT 29.9*  MCV 83.1  PLT 254   Cardiac Enzymes: No results for input(s): CKTOTAL, CKMB, CKMBINDEX, TROPONINI in the last 168 hours. BNP: BNP (last 3 results) No results for input(s): BNP in the last 8760 hours.  ProBNP (last 3 results) No results for input(s): PROBNP in the last 8760 hours.  CBG: Recent Labs  Lab 04/22/18 2254 04/23/18 1118  GLUCAP 116* 124*       Signed:  Florencia Reasons MD, PhD  [facility] 04/23/2018, 12:05 PM

## 2018-04-23 NOTE — Care Management Note (Signed)
Case Management Note  Patient Details  Name: Max Sanchez MRN: [id number] Date of Birth: Mar 01, 1966  Subjective/Objective:   Pt admitted with CVA. He is from home with family.  DME: none at home Pt is attending [facility].  Pt uses [facility] for his medications.                  Action/Plan: Pt discharging home with self care. CM asked discharging MD to send pts prescriptions to [facility] pharmacy so he can receive assistance with the cost of his meds.  Family providing transportation home.  Expected Discharge Date:  04/23/18               Expected Discharge Plan:  Home/Self Care  In-House Referral:     Discharge planning Services  CM Consult  Post Acute Care Choice:    Choice offered to:     DME Arranged:    DME Agency:     HH Arranged:    HH Agency:     Status of Service:  Completed, signed off  If discussed at [employer] of Stay Meetings, dates discussed:    Additional Comments:  Pollie Friar, RN 04/23/2018, 12:24 PM

## 2018-04-29 ENCOUNTER — Ambulatory Visit: Payer: Self-pay

## 2018-04-29 NOTE — Addendum Note (Signed)
Addended by: Scot Jun on: 04/29/2018 08:25 PM   Modules accepted: Orders

## 2018-04-30 NOTE — Progress Notes (Signed)
Patient & daughter notified of results & recommendations using [employer]([place]). Expressed understanding. Made hospital follow up appointment for 3:10 pm on 05/03/2018.

## 2018-05-03 ENCOUNTER — Inpatient Hospital Stay: Payer: Self-pay | Admitting: Family Medicine

## 2018-05-05 ENCOUNTER — Other Ambulatory Visit (HOSPITAL_COMMUNITY): Payer: Self-pay | Admitting: Internal Medicine

## 2018-05-13 ENCOUNTER — Other Ambulatory Visit (HOSPITAL_COMMUNITY): Payer: Self-pay | Admitting: Internal Medicine

## 2018-05-20 ENCOUNTER — Ambulatory Visit: Payer: Self-pay | Admitting: Family Medicine

## 2018-05-25 ENCOUNTER — Telehealth: Payer: Self-pay | Admitting: Family Medicine

## 2018-05-25 NOTE — Telephone Encounter (Signed)
Mrs Max Sanchez from [place] to let us know that they been trying to contact patient  Both numbers go straight to the vm   And also mailed a letter to contact them to schedule an appointment .

## 2018-05-25 NOTE — Telephone Encounter (Signed)
Patient no-showed last office visits with me. Thanks for the update. We will make note that [facility] has attempted to reach patient.

## 2018-06-13 ENCOUNTER — Other Ambulatory Visit: Payer: Self-pay | Admitting: Internal Medicine

## 2018-11-15 ENCOUNTER — Emergency Department (HOSPITAL_COMMUNITY): Payer: Medicaid Other

## 2018-11-15 ENCOUNTER — Other Ambulatory Visit: Payer: Self-pay

## 2018-11-15 ENCOUNTER — Inpatient Hospital Stay (HOSPITAL_COMMUNITY)
Admission: EM | Admit: 2018-11-15 | Discharge: 2018-11-25 | DRG: 264 | Disposition: A | Discharge status: Home / Self Care | Payer: Medicaid Other | Attending: Internal Medicine | Admitting: Internal Medicine

## 2018-11-15 DIAGNOSIS — I639 Cerebral infarction, unspecified: Secondary | ICD-10-CM | POA: Diagnosis present

## 2018-11-15 DIAGNOSIS — E785 Hyperlipidemia, unspecified: Secondary | ICD-10-CM | POA: Diagnosis present

## 2018-11-15 DIAGNOSIS — Z419 Encounter for procedure for purposes other than remedying health state, unspecified: Secondary | ICD-10-CM

## 2018-11-15 DIAGNOSIS — Z841 Family history of disorders of kidney and ureter: Secondary | ICD-10-CM

## 2018-11-15 DIAGNOSIS — G4700 Insomnia, unspecified: Secondary | ICD-10-CM | POA: Diagnosis present

## 2018-11-15 DIAGNOSIS — R778 Other specified abnormalities of plasma proteins: Secondary | ICD-10-CM | POA: Diagnosis present

## 2018-11-15 DIAGNOSIS — E1140 Type 2 diabetes mellitus with diabetic neuropathy, unspecified: Secondary | ICD-10-CM | POA: Diagnosis present

## 2018-11-15 DIAGNOSIS — E8770 Fluid overload, unspecified: Secondary | ICD-10-CM | POA: Diagnosis present

## 2018-11-15 DIAGNOSIS — I160 Hypertensive urgency: Secondary | ICD-10-CM | POA: Diagnosis present

## 2018-11-15 DIAGNOSIS — J9601 Acute respiratory failure with hypoxia: Secondary | ICD-10-CM | POA: Diagnosis present

## 2018-11-15 DIAGNOSIS — E1122 Type 2 diabetes mellitus with diabetic chronic kidney disease: Secondary | ICD-10-CM | POA: Diagnosis present

## 2018-11-15 DIAGNOSIS — K5900 Constipation, unspecified: Secondary | ICD-10-CM | POA: Diagnosis not present

## 2018-11-15 DIAGNOSIS — Z20828 Contact with and (suspected) exposure to other viral communicable diseases: Secondary | ICD-10-CM | POA: Diagnosis present

## 2018-11-15 DIAGNOSIS — Z833 Family history of diabetes mellitus: Secondary | ICD-10-CM

## 2018-11-15 DIAGNOSIS — I10 Essential (primary) hypertension: Secondary | ICD-10-CM | POA: Diagnosis present

## 2018-11-15 DIAGNOSIS — E1129 Type 2 diabetes mellitus with other diabetic kidney complication: Secondary | ICD-10-CM | POA: Diagnosis present

## 2018-11-15 DIAGNOSIS — H35032 Hypertensive retinopathy, left eye: Secondary | ICD-10-CM | POA: Diagnosis present

## 2018-11-15 DIAGNOSIS — D631 Anemia in chronic kidney disease: Secondary | ICD-10-CM | POA: Diagnosis present

## 2018-11-15 DIAGNOSIS — N189 Chronic kidney disease, unspecified: Secondary | ICD-10-CM

## 2018-11-15 DIAGNOSIS — Z87891 Personal history of nicotine dependence: Secondary | ICD-10-CM

## 2018-11-15 DIAGNOSIS — Z8673 Personal history of transient ischemic attack (TIA), and cerebral infarction without residual deficits: Secondary | ICD-10-CM

## 2018-11-15 DIAGNOSIS — Z7984 Long term (current) use of oral hypoglycemic drugs: Secondary | ICD-10-CM

## 2018-11-15 DIAGNOSIS — N186 End stage renal disease: Secondary | ICD-10-CM | POA: Diagnosis present

## 2018-11-15 DIAGNOSIS — R7989 Other specified abnormal findings of blood chemistry: Secondary | ICD-10-CM | POA: Diagnosis present

## 2018-11-15 DIAGNOSIS — Z79899 Other long term (current) drug therapy: Secondary | ICD-10-CM

## 2018-11-15 DIAGNOSIS — I132 Hypertensive heart and chronic kidney disease with heart failure and with stage 5 chronic kidney disease, or end stage renal disease: Principal | ICD-10-CM | POA: Diagnosis present

## 2018-11-15 DIAGNOSIS — N179 Acute kidney failure, unspecified: Secondary | ICD-10-CM | POA: Diagnosis present

## 2018-11-15 DIAGNOSIS — E113593 Type 2 diabetes mellitus with proliferative diabetic retinopathy without macular edema, bilateral: Secondary | ICD-10-CM | POA: Diagnosis present

## 2018-11-15 DIAGNOSIS — N184 Chronic kidney disease, stage 4 (severe): Secondary | ICD-10-CM | POA: Diagnosis present

## 2018-11-15 DIAGNOSIS — Z9889 Other specified postprocedural states: Secondary | ICD-10-CM

## 2018-11-15 DIAGNOSIS — I5033 Acute on chronic diastolic (congestive) heart failure: Secondary | ICD-10-CM | POA: Diagnosis present

## 2018-11-15 LAB — CBC WITH DIFFERENTIAL/PLATELET
Abs Immature Granulocytes: 0.01 10*3/uL (ref 0.00–0.07)
Basophils Absolute: 0 10*3/uL (ref 0.0–0.1)
Basophils Relative: 1 %
Eosinophils Absolute: 0.2 10*3/uL (ref 0.0–0.5)
Eosinophils Relative: 3 %
HCT: 24.2 % — ABNORMAL LOW (ref 39.0–52.0)
Hemoglobin: 8.3 g/dL — ABNORMAL LOW (ref 13.0–17.0)
Immature Granulocytes: 0 %
Lymphocytes Relative: 19 %
Lymphs Abs: 1 10*3/uL (ref 0.7–4.0)
MCH: 29.9 pg (ref 26.0–34.0)
MCHC: 34.3 g/dL (ref 30.0–36.0)
MCV: 87.1 fL (ref 80.0–100.0)
Monocytes Absolute: 0.5 10*3/uL (ref 0.1–1.0)
Monocytes Relative: 9 %
Neutro Abs: 3.7 10*3/uL (ref 1.7–7.7)
Neutrophils Relative %: 68 %
Platelets: 227 10*3/uL (ref 150–400)
RBC: 2.78 MIL/uL — ABNORMAL LOW (ref 4.22–5.81)
RDW: 12.9 % (ref 11.5–15.5)
WBC: 5.4 10*3/uL (ref 4.0–10.5)
nRBC: 0 % (ref 0.0–0.2)

## 2018-11-15 MED ORDER — NITROGLYCERIN 2 % TD OINT
1.0000 [in_us] | TOPICAL_OINTMENT | Freq: Once | TRANSDERMAL | Status: AC
  Administered 2018-11-15: 1 [in_us] via TOPICAL
  Filled 2018-11-15: qty 1

## 2018-11-15 MED ORDER — FUROSEMIDE 10 MG/ML IJ SOLN
40.0000 mg | Freq: Once | INTRAMUSCULAR | Status: AC
  Administered 2018-11-15: 40 mg via INTRAVENOUS
  Filled 2018-11-15: qty 4

## 2018-11-15 NOTE — ED Provider Notes (Signed)
[facility] EMERGENCY DEPARTMENT Provider Note   CSN: [id number] Arrival date & time: 11/15/18  2258     History   Chief Complaint Chief Complaint  Patient presents with  . Respiratory Distress  . Shortness of Breath    HPI Max Sanchez is a 53 y.o. male.     Patient is a 53 year old male with past medical history of chronic renal insufficiency, history of prior CVA, hypertension, and diabetes.  He presents today for evaluation of shortness of breath.  This is progressed over the past several days and is rapidly worsening.  Patient denies any fevers or chills.  He does describe some swelling in his ankles that has been present for quite some time, however does also seem to be worsening.  Patient speaks only [place].  History taken with the assistance of the RN at bedside who is fluent in [place].  The history is provided by the patient.  Shortness of Breath Severity:  Moderate Onset quality:  Sudden Duration:  3 days Timing:  Constant Progression:  Worsening Chronicity:  New Context: activity   Relieved by:  Nothing Worsened by:  Nothing Ineffective treatments:  None tried Associated symptoms: no fever and no sputum production     Past Medical History:  Diagnosis Date  . CKD (chronic kidney disease), stage III ([place]) 03/10/2018  . Diabetic neuropathy ([place])   . History of CVA (cerebrovascular accident) 03/10/2018  . Hyperlipidemia 03/19/2018  . Hypertension 03/10/2018  . Hypertensive retinopathy of left eye   . 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 Type 2 diabetes mellitus with complication, without long-term current use of insulin [facility])     Patient Active Problem List   Diagnosis Date Noted  . CKD (chronic kidney disease), stage IV ([place])   . Stroke ([place]) 04/22/2018  . Diabetes mellitus type 2, noninsulin dependent ([place]) 04/22/2018  . Hyperkalemia 04/22/2018  . Hyperlipidemia 03/19/2018   . Insomnia 03/19/2018  . Hypertension 03/10/2018  . CKD (chronic kidney disease), stage III ([place]) 03/10/2018  . Acute-on-chronic kidney injury ([place]) 03/10/2018  . Proteinuria 03/10/2018  . Prolonged Q-T interval on ECG 03/10/2018  . History of CVA (cerebrovascular accident) 03/10/2018    Past Surgical History:  Procedure Laterality Date  . EYE SURGERY Bilateral   . FOOT SURGERY    . PR RPR COMPLEX RETINA DETACH VITRECT &MEMBRANE PEEL Left 10/01/2016  . PR RPR COMPLEX RETINA DETACH VITRECT &MEMBRANE PEEL Right 12/03/2016  . PR RPR COMPLEX RETINA DETACH VITRECT &MEMBRANE PEEL Right 04/01/2017  . PR XCAPSL CTRC RMVL INSJ IO LENS PROSTH W/O ECP Right 12/03/2016        Home Medications    Prior to Admission medications   Medication Sig Start Date End Date Taking? Authorizing Provider  amLODipine (NORVASC) 5 MG tablet Take 1 tablet (5 mg total) by mouth daily for 30 days. 04/26/18 05/26/18  Florencia Reasons, MD  atorvastatin (LIPITOR) 40 MG tablet Take 1 tablet (40 mg total) by mouth daily at 6 PM. 04/22/18   Scot Jun, FNP  gabapentin (NEURONTIN) 100 MG capsule Take 1 capsule (100 mg total) by mouth 3 (three) times daily. 04/22/18   Scot Jun, FNP  glipiZIDE (GLUCOTROL XL) 5 MG 24 hr tablet Take 1 tablet (5 mg total) by mouth daily with breakfast. 04/22/18   Scot Jun, FNP  hydrALAZINE (APRESOLINE) 100 MG tablet Take 0.5 tablets (50 mg total) by mouth 2 (two) times  daily. 04/26/18   Florencia Reasons, MD  labetalol (NORMODYNE) 200 MG tablet Take 1 tablet (200 mg total) by mouth 3 (three) times daily. 04/26/18   Florencia Reasons, MD    Family History Family History  Problem Relation Age of Onset  . Diabetes Father   . Diabetes Brother   . Kidney disease Brother     Social History Social History   Tobacco Use  . Smoking status: Former Research scientist (life sciences)  . Smokeless tobacco: Never Used  . Tobacco comment: 10 pack year history; quit 2 years ago  Substance Use Topics  . Alcohol use: Not  Currently    Comment: stopped 2 years ago  . Drug use: Not Currently    Comment: tried cocaine and marijuana when younger     Allergies   Patient has no known allergies.   Review of Systems Review of Systems  Constitutional: Negative for fever.  Respiratory: Positive for shortness of breath. Negative for sputum production.   All other systems reviewed and are negative.    Physical Exam Updated Vital Signs BP (!) 217/123 (BP Location: Left Arm)   Pulse 91   Ht 5\' 6"  (1.676 m)   Wt 80 kg   SpO2 100% Comment: RA  BMI 28.47 kg/m   Physical Exam Vitals signs and nursing note reviewed.  Constitutional:      General: He is not in acute distress.    Appearance: He is well-developed. He is not diaphoretic.  HENT:     Head: Normocephalic and atraumatic.  Neck:     Musculoskeletal: Normal range of motion and neck supple.  Cardiovascular:     Rate and Rhythm: Normal rate and regular rhythm.     Heart sounds: No murmur. No friction rub.  Pulmonary:     Effort: Pulmonary effort is normal. No respiratory distress.     Breath sounds: Normal breath sounds. No wheezing or rales.  Abdominal:     General: Bowel sounds are normal. There is no distension.     Palpations: Abdomen is soft.     Tenderness: There is no abdominal tenderness.  Musculoskeletal: Normal range of motion.  Skin:    General: Skin is warm and dry.  Neurological:     Mental Status: He is alert and oriented to person, place, and time.     Coordination: Coordination normal.      ED Treatments / Results  Labs (all labs ordered are listed, but only abnormal results are displayed) Labs Reviewed  COMPREHENSIVE METABOLIC PANEL  CBC WITH DIFFERENTIAL/PLATELET  BRAIN NATRIURETIC PEPTIDE  TROPONIN I (HIGH SENSITIVITY)    EKG EKG Interpretation  Date/Time:  Monday November 15 2018 23:02:55 EDT Ventricular Rate:  87 PR Interval:    QRS Duration: 106 QT Interval:  432 QTC Calculation: 520 R Axis:   85  Text Interpretation:  Sinus rhythm Borderline ST depression, lateral leads Prolonged QT interval No significant change since 04/22/2018 Confirmed by Veryl Speak [phone]) on 11/15/2018 11:10:47 PM   Radiology No results found.  Procedures Procedures (including critical care time)  Medications Ordered in ED Medications - No data to display   Initial Impression / Assessment and Plan / ED Course  I have reviewed the triage vital signs and the nursing notes.  Pertinent labs & imaging results that were available during my care of the patient were reviewed by me and considered in my medical decision making (see chart for details).  Patient presenting here with complaints of shortness of breath.  He has  a history of renal insufficiency.  He was due to see nephrology, however did not follow-up.  His breathing has worsened over the past week, to the point he is now requiring supplemental oxygen to maintain his saturations.  Today's laboratory studies reveal worsening renal function.  His creatinine is 11.3 with most recent previous value of 4.2.  His chest x-ray shows volume overload.  I suspect this patient's renal function has worsened to the point he is now in congestive heart failure.  Patient was given IV Lasix along with Nitropaste as he was significantly hypertensive.  Care was discussed with Dr. Johnney Ou from nephrology.  It is her recommendation that the patient have a total of 200 mg of IV Lasix along with 5 mg of oral metolazone.  These have been given.  Patient will be admitted to the hospitalist service.  He will likely require dialysis catheter placement in the morning followed by urgent dialysis.  I have spoken with Dr. Blaine Hamper who agrees to admit.  CRITICAL CARE Performed by: Veryl Speak Total critical care time: 35 minutes Critical care time was exclusive of separately billable procedures and treating other patients. Critical care was necessary to treat or prevent imminent or  life-threatening deterioration. Critical care was time spent personally by me on the following activities: development of treatment plan with patient and/or surrogate as well as nursing, discussions with consultants, evaluation of patient's response to treatment, examination of patient, obtaining history from patient or surrogate, ordering and performing treatments and interventions, ordering and review of laboratory studies, ordering and review of radiographic studies, pulse oximetry and re-evaluation of patient's condition.   Final Clinical Impressions(s) / ED Diagnoses   Final diagnoses:  None    ED Discharge Orders    None       Veryl Speak, MD 11/16/18 [id number]

## 2018-11-15 NOTE — ED Triage Notes (Signed)
Pt arrived via [facility], c/o acute dyspnea and hypoxia x3 hours. Rales noted to lower lung fields. SPO2 82 % RA. UTO PMHX and allergies due to language barrier (preferred language is [place]). IV access established PTA and nitro 0.4 mg x3 administered by EMS.

## 2018-11-16 ENCOUNTER — Inpatient Hospital Stay (HOSPITAL_COMMUNITY): Payer: Medicaid Other

## 2018-11-16 DIAGNOSIS — Z841 Family history of disorders of kidney and ureter: Secondary | ICD-10-CM | POA: Diagnosis not present

## 2018-11-16 DIAGNOSIS — D631 Anemia in chronic kidney disease: Secondary | ICD-10-CM | POA: Diagnosis present

## 2018-11-16 DIAGNOSIS — N179 Acute kidney failure, unspecified: Secondary | ICD-10-CM

## 2018-11-16 DIAGNOSIS — Z87891 Personal history of nicotine dependence: Secondary | ICD-10-CM | POA: Diagnosis not present

## 2018-11-16 DIAGNOSIS — N184 Chronic kidney disease, stage 4 (severe): Secondary | ICD-10-CM

## 2018-11-16 DIAGNOSIS — I132 Hypertensive heart and chronic kidney disease with heart failure and with stage 5 chronic kidney disease, or end stage renal disease: Secondary | ICD-10-CM | POA: Diagnosis present

## 2018-11-16 DIAGNOSIS — E1122 Type 2 diabetes mellitus with diabetic chronic kidney disease: Secondary | ICD-10-CM | POA: Diagnosis present

## 2018-11-16 DIAGNOSIS — E113593 Type 2 diabetes mellitus with proliferative diabetic retinopathy without macular edema, bilateral: Secondary | ICD-10-CM | POA: Diagnosis present

## 2018-11-16 DIAGNOSIS — Z79899 Other long term (current) drug therapy: Secondary | ICD-10-CM | POA: Diagnosis not present

## 2018-11-16 DIAGNOSIS — R7989 Other specified abnormal findings of blood chemistry: Secondary | ICD-10-CM | POA: Diagnosis present

## 2018-11-16 DIAGNOSIS — E1140 Type 2 diabetes mellitus with diabetic neuropathy, unspecified: Secondary | ICD-10-CM | POA: Diagnosis present

## 2018-11-16 DIAGNOSIS — I5033 Acute on chronic diastolic (congestive) heart failure: Secondary | ICD-10-CM | POA: Diagnosis present

## 2018-11-16 DIAGNOSIS — J9601 Acute respiratory failure with hypoxia: Secondary | ICD-10-CM | POA: Diagnosis present

## 2018-11-16 DIAGNOSIS — E1129 Type 2 diabetes mellitus with other diabetic kidney complication: Secondary | ICD-10-CM | POA: Diagnosis present

## 2018-11-16 DIAGNOSIS — Z20828 Contact with and (suspected) exposure to other viral communicable diseases: Secondary | ICD-10-CM | POA: Diagnosis present

## 2018-11-16 DIAGNOSIS — E785 Hyperlipidemia, unspecified: Secondary | ICD-10-CM | POA: Diagnosis present

## 2018-11-16 DIAGNOSIS — G4700 Insomnia, unspecified: Secondary | ICD-10-CM | POA: Diagnosis present

## 2018-11-16 DIAGNOSIS — I6300 Cerebral infarction due to thrombosis of unspecified precerebral artery: Secondary | ICD-10-CM

## 2018-11-16 DIAGNOSIS — N186 End stage renal disease: Secondary | ICD-10-CM | POA: Diagnosis present

## 2018-11-16 DIAGNOSIS — E8770 Fluid overload, unspecified: Secondary | ICD-10-CM

## 2018-11-16 DIAGNOSIS — I160 Hypertensive urgency: Secondary | ICD-10-CM | POA: Diagnosis present

## 2018-11-16 DIAGNOSIS — R778 Other specified abnormalities of plasma proteins: Secondary | ICD-10-CM | POA: Diagnosis present

## 2018-11-16 DIAGNOSIS — Z833 Family history of diabetes mellitus: Secondary | ICD-10-CM | POA: Diagnosis not present

## 2018-11-16 DIAGNOSIS — N189 Chronic kidney disease, unspecified: Secondary | ICD-10-CM

## 2018-11-16 DIAGNOSIS — E8779 Other fluid overload: Secondary | ICD-10-CM | POA: Diagnosis present

## 2018-11-16 DIAGNOSIS — Z7984 Long term (current) use of oral hypoglycemic drugs: Secondary | ICD-10-CM | POA: Diagnosis not present

## 2018-11-16 DIAGNOSIS — Z8673 Personal history of transient ischemic attack (TIA), and cerebral infarction without residual deficits: Secondary | ICD-10-CM | POA: Diagnosis not present

## 2018-11-16 DIAGNOSIS — K5900 Constipation, unspecified: Secondary | ICD-10-CM | POA: Diagnosis not present

## 2018-11-16 DIAGNOSIS — H35032 Hypertensive retinopathy, left eye: Secondary | ICD-10-CM | POA: Diagnosis present

## 2018-11-16 HISTORY — PX: IR US GUIDE VASC ACCESS RIGHT: IMG2390

## 2018-11-16 HISTORY — PX: IR FLUORO GUIDE CV LINE RIGHT: IMG2283

## 2018-11-16 LAB — LIPID PANEL
Cholesterol: 260 mg/dL — ABNORMAL HIGH (ref 0–200)
HDL: 40 mg/dL — ABNORMAL LOW (ref >40)
LDL Cholesterol: 189 mg/dL — ABNORMAL HIGH (ref 0–99)
Total CHOL/HDL Ratio: 6.5 RATIO
Triglycerides: 153 mg/dL — ABNORMAL HIGH (ref <150)
VLDL: 31 mg/dL (ref 0–40)

## 2018-11-16 LAB — COMPREHENSIVE METABOLIC PANEL
ALT: 21 U/L (ref 0–44)
AST: 16 U/L (ref 15–41)
Albumin: 3.1 g/dL — ABNORMAL LOW (ref 3.5–5.0)
Alkaline Phosphatase: 72 U/L (ref 38–126)
Anion gap: 14 (ref 5–15)
BUN: 110 mg/dL — ABNORMAL HIGH (ref 6–20)
CO2: 17 mmol/L — ABNORMAL LOW (ref 22–32)
Calcium: 7.2 mg/dL — ABNORMAL LOW (ref 8.9–10.3)
Chloride: 101 mmol/L (ref 98–111)
Creatinine, Ser: 11.3 mg/dL — ABNORMAL HIGH (ref 0.61–1.24)
GFR calc Af Amer: 5 mL/min — ABNORMAL LOW (ref >60)
GFR calc non Af Amer: 5 mL/min — ABNORMAL LOW (ref >60)
Glucose, Bld: 178 mg/dL — ABNORMAL HIGH (ref 70–99)
Potassium: 5.1 mmol/L (ref 3.5–5.1)
Sodium: 132 mmol/L — ABNORMAL LOW (ref 135–145)
Total Bilirubin: 0.6 mg/dL (ref 0.3–1.2)
Total Protein: 6.7 g/dL (ref 6.5–8.1)

## 2018-11-16 LAB — PROTIME-INR
INR: 1.1 (ref 0.8–1.2)
Prothrombin Time: 14.2 seconds (ref 11.4–15.2)

## 2018-11-16 LAB — CBG MONITORING, ED
Glucose-Capillary: 142 mg/dL — ABNORMAL HIGH (ref 70–99)
Glucose-Capillary: 100 mg/dL — ABNORMAL HIGH (ref 70–99)
Glucose-Capillary: 106 mg/dL — ABNORMAL HIGH (ref 70–99)
Glucose-Capillary: 113 mg/dL — ABNORMAL HIGH (ref 70–99)
Glucose-Capillary: 118 mg/dL — ABNORMAL HIGH (ref 70–99)

## 2018-11-16 LAB — SARS CORONAVIRUS 2 BY RT PCR (HOSPITAL ORDER, PERFORMED IN ~~LOC~~ HOSPITAL LAB): SARS Coronavirus 2: NEGATIVE

## 2018-11-16 LAB — RENAL FUNCTION PANEL
Albumin: 2.7 g/dL — ABNORMAL LOW (ref 3.5–5.0)
Anion gap: 15 (ref 5–15)
BUN: 116 mg/dL — ABNORMAL HIGH (ref 6–20)
CO2: 15 mmol/L — ABNORMAL LOW (ref 22–32)
Calcium: 7 mg/dL — ABNORMAL LOW (ref 8.9–10.3)
Chloride: 102 mmol/L (ref 98–111)
Creatinine, Ser: 11.84 mg/dL — ABNORMAL HIGH (ref 0.61–1.24)
GFR calc Af Amer: 5 mL/min — ABNORMAL LOW (ref >60)
GFR calc non Af Amer: 4 mL/min — ABNORMAL LOW (ref >60)
Glucose, Bld: 101 mg/dL — ABNORMAL HIGH (ref 70–99)
Phosphorus: 8 mg/dL — ABNORMAL HIGH (ref 2.5–4.6)
Potassium: 4.8 mmol/L (ref 3.5–5.1)
Sodium: 132 mmol/L — ABNORMAL LOW (ref 135–145)

## 2018-11-16 LAB — URINALYSIS, ROUTINE W REFLEX MICROSCOPIC
Bacteria, UA: NONE SEEN
Bilirubin Urine: NEGATIVE
Glucose, UA: 150 mg/dL — AB
Ketones, ur: NEGATIVE mg/dL
Leukocytes,Ua: NEGATIVE
Nitrite: NEGATIVE
Protein, ur: >=300 mg/dL — AB
Specific Gravity, Urine: 1.01 (ref 1.005–1.030)
pH: 6 (ref 5.0–8.0)

## 2018-11-16 LAB — TROPONIN I (HIGH SENSITIVITY)
Troponin I (High Sensitivity): 25 ng/L — ABNORMAL HIGH (ref <18)
Troponin I (High Sensitivity): 174 ng/L (ref <18)
Troponin I (High Sensitivity): 568 ng/L (ref <18)
Troponin I (High Sensitivity): 579 ng/L (ref <18)
Troponin I (High Sensitivity): 506 ng/L (ref <18)

## 2018-11-16 LAB — IRON AND TIBC
Iron: 24 ug/dL — ABNORMAL LOW (ref 45–182)
Saturation Ratios: 9 % — ABNORMAL LOW (ref 17.9–39.5)
TIBC: 277 ug/dL (ref 250–450)
UIBC: 253 ug/dL

## 2018-11-16 LAB — BRAIN NATRIURETIC PEPTIDE: B Natriuretic Peptide: 1378.9 pg/mL — ABNORMAL HIGH (ref 0.0–100.0)

## 2018-11-16 LAB — RAPID URINE DRUG SCREEN, HOSP PERFORMED
Amphetamines: NOT DETECTED
Barbiturates: NOT DETECTED
Benzodiazepines: NOT DETECTED
Cocaine: NOT DETECTED
Opiates: NOT DETECTED
Tetrahydrocannabinol: NOT DETECTED

## 2018-11-16 LAB — FERRITIN: Ferritin: 179 ng/mL (ref 24–336)

## 2018-11-16 LAB — APTT: aPTT: 28 seconds (ref 24–36)

## 2018-11-16 MED ORDER — SODIUM CHLORIDE 0.9 % IV SOLN: 100.0000 mL | INTRAVENOUS | Status: DC | PRN

## 2018-11-16 MED ORDER — PENTAFLUOROPROP-TETRAFLUOROETH EX AERO
1.0000 "application " | INHALATION_SPRAY | CUTANEOUS | Status: DC | PRN
  Filled 2018-11-16: qty 116

## 2018-11-16 MED ORDER — HEPARIN SODIUM (PORCINE) 1000 UNIT/ML IJ SOLN
INTRAMUSCULAR | Status: AC
  Filled 2018-11-16: qty 1

## 2018-11-16 MED ORDER — LIDOCAINE HCL (PF) 1 % IJ SOLN
5.0000 mL | INTRAMUSCULAR | Status: DC | PRN
  Filled 2018-11-16: qty 5

## 2018-11-16 MED ORDER — HYDRALAZINE HCL 25 MG PO TABS
50.0000 mg | ORAL_TABLET | Freq: Three times a day (TID) | ORAL | Status: DC
  Administered 2018-11-16 – 2018-11-25 (×25): 50 mg via ORAL
  Filled 2018-11-16 (×25): qty 2

## 2018-11-16 MED ORDER — METOLAZONE 5 MG PO TABS
5.0000 mg | ORAL_TABLET | Freq: Once | ORAL | Status: AC
  Administered 2018-11-16: 5 mg via ORAL
  Filled 2018-11-16: qty 1

## 2018-11-16 MED ORDER — CHLORHEXIDINE GLUCONATE CLOTH 2 % EX PADS
6.0000 | MEDICATED_PAD | Freq: Every day | CUTANEOUS | Status: DC
  Administered 2018-11-18 – 2018-11-24 (×6): 6 via TOPICAL

## 2018-11-16 MED ORDER — HEPARIN SODIUM (PORCINE) 5000 UNIT/ML IJ SOLN
5000.0000 [IU] | Freq: Three times a day (TID) | INTRAMUSCULAR | Status: DC
  Administered 2018-11-16 – 2018-11-25 (×23): 5000 [IU] via SUBCUTANEOUS
  Filled 2018-11-16 (×24): qty 1

## 2018-11-16 MED ORDER — LABETALOL HCL 200 MG PO TABS
200.0000 mg | ORAL_TABLET | Freq: Every day | ORAL | Status: DC
  Administered 2018-11-16: 200 mg via ORAL
  Filled 2018-11-16: qty 1

## 2018-11-16 MED ORDER — LIDOCAINE HCL 1 % IJ SOLN
INTRAMUSCULAR | Status: AC | PRN
  Administered 2018-11-16: 5 mL

## 2018-11-16 MED ORDER — LIDOCAINE HCL 1 % IJ SOLN
INTRAMUSCULAR | Status: AC
  Filled 2018-11-16: qty 20

## 2018-11-16 MED ORDER — INSULIN ASPART 100 UNIT/ML ~~LOC~~ SOLN
0.0000 [IU] | Freq: Three times a day (TID) | SUBCUTANEOUS | Status: DC
  Administered 2018-11-17: 3 [IU] via SUBCUTANEOUS
  Administered 2018-11-17: 1 [IU] via SUBCUTANEOUS
  Administered 2018-11-18: 3 [IU] via SUBCUTANEOUS
  Administered 2018-11-18: 17:00:00 1 [IU] via SUBCUTANEOUS
  Administered 2018-11-19 (×2): 2 [IU] via SUBCUTANEOUS
  Administered 2018-11-20: 17:00:00 1 [IU] via SUBCUTANEOUS
  Administered 2018-11-20: 13:00:00 3 [IU] via SUBCUTANEOUS
  Administered 2018-11-21 (×2): 2 [IU] via SUBCUTANEOUS
  Administered 2018-11-22: 18:00:00 1 [IU] via SUBCUTANEOUS
  Administered 2018-11-23 – 2018-11-24 (×2): 2 [IU] via SUBCUTANEOUS
  Administered 2018-11-24: 1 [IU] via SUBCUTANEOUS
  Administered 2018-11-25 (×2): 3 [IU] via SUBCUTANEOUS

## 2018-11-16 MED ORDER — NITROGLYCERIN 0.4 MG SL SUBL: 0.4000 mg | SUBLINGUAL_TABLET | SUBLINGUAL | Status: DC | PRN

## 2018-11-16 MED ORDER — ATORVASTATIN CALCIUM 40 MG PO TABS
40.0000 mg | ORAL_TABLET | Freq: Every day | ORAL | Status: DC
  Administered 2018-11-16 – 2018-11-24 (×9): 40 mg via ORAL
  Filled 2018-11-16 (×9): qty 1

## 2018-11-16 MED ORDER — SODIUM CHLORIDE 0.9% FLUSH: 3.0000 mL | INTRAVENOUS | Status: DC | PRN

## 2018-11-16 MED ORDER — ASPIRIN EC 81 MG PO TBEC
81.0000 mg | DELAYED_RELEASE_TABLET | Freq: Every day | ORAL | Status: DC
  Administered 2018-11-16 – 2018-11-25 (×9): 81 mg via ORAL
  Filled 2018-11-16 (×9): qty 1

## 2018-11-16 MED ORDER — FUROSEMIDE 10 MG/ML IJ SOLN
160.0000 mg | Freq: Once | INTRAVENOUS | Status: AC
  Administered 2018-11-16: 160 mg via INTRAVENOUS
  Filled 2018-11-16: qty 10

## 2018-11-16 MED ORDER — FUROSEMIDE 10 MG/ML IJ SOLN
160.0000 mg | Freq: Once | INTRAVENOUS | Status: AC
  Administered 2018-11-16: 160 mg via INTRAVENOUS
  Filled 2018-11-16: qty 16

## 2018-11-16 MED ORDER — HEPARIN SODIUM (PORCINE) 1000 UNIT/ML DIALYSIS
1000.0000 [IU] | INTRAMUSCULAR | Status: DC | PRN
  Administered 2018-11-22: 11:00:00 2600 [IU] via INTRAVENOUS_CENTRAL
  Administered 2018-11-24: 3400 [IU] via INTRAVENOUS_CENTRAL
  Filled 2018-11-16 (×3): qty 1

## 2018-11-16 MED ORDER — HEPARIN SODIUM (PORCINE) 1000 UNIT/ML IJ SOLN
INTRAMUSCULAR | Status: AC | PRN
  Administered 2018-11-16: 2.6 mL via INTRAVENOUS

## 2018-11-16 MED ORDER — INSULIN ASPART 100 UNIT/ML ~~LOC~~ SOLN
0.0000 [IU] | Freq: Every day | SUBCUTANEOUS | Status: DC
  Administered 2018-11-21: 2 [IU] via SUBCUTANEOUS
  Administered 2018-11-24: 3 [IU] via SUBCUTANEOUS

## 2018-11-16 MED ORDER — SODIUM CHLORIDE 0.9% FLUSH
3.0000 mL | Freq: Two times a day (BID) | INTRAVENOUS | Status: DC
  Administered 2018-11-16 – 2018-11-20 (×9): 3 mL via INTRAVENOUS

## 2018-11-16 MED ORDER — ACETAMINOPHEN 325 MG PO TABS
650.0000 mg | ORAL_TABLET | ORAL | Status: DC | PRN
  Administered 2018-11-16: 650 mg via ORAL
  Filled 2018-11-16: qty 2

## 2018-11-16 MED ORDER — MORPHINE SULFATE (PF) 2 MG/ML IV SOLN
1.0000 mg | INTRAVENOUS | Status: DC | PRN
  Administered 2018-11-22 – 2018-11-23 (×2): 1 mg via INTRAVENOUS
  Filled 2018-11-16 (×2): qty 1

## 2018-11-16 MED ORDER — ALTEPLASE 2 MG IJ SOLR
2.0000 mg | Freq: Once | INTRAMUSCULAR | Status: DC | PRN
  Filled 2018-11-16: qty 2

## 2018-11-16 MED ORDER — LABETALOL HCL 100 MG PO TABS
100.0000 mg | ORAL_TABLET | Freq: Every day | ORAL | Status: DC
  Administered 2018-11-17 – 2018-11-25 (×7): 100 mg via ORAL
  Filled 2018-11-16 (×8): qty 1

## 2018-11-16 MED ORDER — LIDOCAINE-PRILOCAINE 2.5-2.5 % EX CREA
1.0000 "application " | TOPICAL_CREAM | CUTANEOUS | Status: DC | PRN
  Filled 2018-11-16: qty 5

## 2018-11-16 MED ORDER — AMLODIPINE BESYLATE 10 MG PO TABS
10.0000 mg | ORAL_TABLET | Freq: Every day | ORAL | Status: DC
  Administered 2018-11-16 – 2018-11-25 (×7): 10 mg via ORAL
  Filled 2018-11-16 (×6): qty 1
  Filled 2018-11-16 (×2): qty 2
  Filled 2018-11-16: qty 1

## 2018-11-16 MED ORDER — SODIUM CHLORIDE 0.9 % IV SOLN: 250.0000 mL | INTRAVENOUS | Status: DC | PRN

## 2018-11-16 MED ORDER — HYDRALAZINE HCL 20 MG/ML IJ SOLN: 5.0000 mg | INTRAMUSCULAR | Status: DC | PRN

## 2018-11-16 NOTE — ED Notes (Signed)
Taken to IR

## 2018-11-16 NOTE — Procedures (Signed)
Interventional Radiology Procedure Note  Procedure: Temp dialysis catheter placement  Complications: None immediate  Estimated Blood Loss: <72ml  Recommendations: Catheter ready for immediate use as clinically indicated.   Signed,  Constance Holster, MD

## 2018-11-16 NOTE — Progress Notes (Signed)
PROGRESS NOTE                                                                                                                                                                                                             Patient Demographics:    Max Sanchez, is a 53 y.o. male, DOB - Sep 01, 1965, [id number]  Admit date - 11/15/2018   Admitting Physician Ivor Costa, MD  Outpatient Primary MD for the patient is Scot Jun, FNP  LOS - 0  Chief Complaint  Patient presents with  . Respiratory Distress  . Shortness of Breath       Brief Narrative  Max Sanchez is a 53 y.o. male with medical history significant of HTN, HLD, DM, h/o CVA (03/2018), CKD-IV, anemia, dCHF,who presents with shortness of breath.  In the ER he was diagnosed with acute on chronic CHF due to fluid overload caused by renal failure.  Nephrology was consulted and we were requested to admit the patient.    Subjective:    Max Sanchez today has, No headache, No chest pain, No abdominal pain - No Nausea, No new weakness tingling or numbness, No Cough - ++ SOB.     Assessment  & Plan :     1.  Acute hypoxic respiratory failure due to fluid overload caused by ESRD intern causing acute on chronic diastolic CHF recent echo in January showed EF of 60%.  At this point nephrology has been consulted, he has been placed on high-dose IV Lasix, he may require dialysis hence the peri-catheter was ordered by nephrology as well.  Overall shortness of breath has improved, continue supportive care and fluid removal per nephrology.  2.  Hypertension.  Currently on combination of Norvasc, hydralazine and beta-blocker.  Will adjust as needed.  Currently blood pressure slightly on the lower side.  Will cut down beta-blocker into half.  3.  AKI on CKD 5.  Nephrology consulted may require commencement of dialysis this admission.  4.  Dyslipidemia.  Continue home dose statin.  5.  History of stroke.  Continue  aspirin and statin for secondary prevention.    6.  Troponin elevation.  In non-ACS pattern due to stress caused by acute hypoxic respiratory failure and CHF, in the setting of ESRD.  Trend is flat, chest pain-free, EKG nonacute.  No further work-up.  Continue aspirin, beta-blocker and statin.    7. DM type II.  On sliding scale.  CBG (last 3)  Recent Labs  11/16/18 0231 11/16/18 0745 11/16/18 1135  GLUCAP 142* 100* 106*    Lab Results  Component Value Date   HGBA1C 6.7 (H) 04/22/2018     Family Communication  : Daughter bedside  Code Status : Full  Disposition Plan  : Telemetry  Consults  : Nephrology  Procedures  :    DVT Prophylaxis  :    Heparin added  Lab Results  Component Value Date   PLT 227 11/15/2018    Diet :  Diet Order            Diet NPO time specified Except for: Sips with Meds, Ice Chips  Diet effective midnight        Diet NPO time specified  Diet effective now               Inpatient Medications Scheduled Meds: . amLODipine  10 mg Oral Daily  . aspirin EC  81 mg Oral Daily  . atorvastatin  40 mg Oral q1800  . Chlorhexidine Gluconate Cloth  6 each Topical Q0600  . hydrALAZINE  50 mg Oral Q8H  . insulin aspart  0-5 Units Subcutaneous QHS  . insulin aspart  0-9 Units Subcutaneous TID WC  . labetalol  200 mg Oral Daily  . sodium chloride flush  3 mL Intravenous Q12H   Continuous Infusions: . sodium chloride    . sodium chloride    . sodium chloride    . furosemide     PRN Meds:.sodium chloride, sodium chloride, sodium chloride, acetaminophen, alteplase, heparin, hydrALAZINE, lidocaine (PF), lidocaine-prilocaine, morphine injection, nitroGLYCERIN, pentafluoroprop-tetrafluoroeth, sodium chloride flush  Antibiotics  :   Anti-infectives (From admission, onward)   None          Objective:   Vitals:   11/16/18 0945 11/16/18 1015 11/16/18 1045 11/16/18 1130  BP: 135/79 132/75 114/64 105/64  Pulse: 79 73 65 66  Resp: (!) 24  (!) 25 13 13   SpO2: 100% 100% 100% 100%  Weight:      Height:        Wt Readings from Last 3 Encounters:  11/15/18 80 kg  04/22/18 80.3 kg  04/22/18 80.5 kg     Intake/Output Summary (Last 24 hours) at 11/16/2018 1158 Last data filed at 11/16/2018 0225 Gross per 24 hour  Intake 76.98 ml  Output -  Net 76.98 ml     Physical Exam  Awake Alert, Oriented X 3, No new F.N deficits, Normal affect [place].AT,PERRAL Supple Neck,No JVD, No cervical lymphadenopathy appriciated.  Symmetrical Chest wall movement, Good air movement bilaterally, positive crackles bilaterally RRR,No Gallops,Rubs or new Murmurs, No Parasternal Heave +ve B.Sounds, Abd Soft, No tenderness, No organomegaly appriciated, No rebound - guarding or rigidity. No Cyanosis, Clubbing or edema, No new Rash or bruise      Data Review:    CBC Recent Labs  Lab 11/15/18 2304  WBC 5.4  HGB 8.3*  HCT 24.2*  PLT 227  MCV 87.1  MCH 29.9  MCHC 34.3  RDW 12.9  LYMPHSABS 1.0  MONOABS 0.5  EOSABS 0.2  BASOSABS 0.0    Chemistries  Recent Labs  Lab 11/15/18 2304  NA 132*  K 5.1  CL 101  CO2 17*  GLUCOSE 178*  BUN 110*  CREATININE 11.30*  CALCIUM 7.2*  AST 16  ALT 21  ALKPHOS 72  BILITOT 0.6   ------------------------------------------------------------------------------------------------------------------ Recent Labs    11/16/18 0147  CHOL 260*  HDL 40*  LDLCALC 189*  TRIG 153*  CHOLHDL 6.5  Lab Results  Component Value Date   HGBA1C 6.7 (H) 04/22/2018   ------------------------------------------------------------------------------------------------------------------ No results for input(s): TSH, T4TOTAL, T3FREE, THYROIDAB in the last 72 hours.  Invalid input(s): FREET3 ------------------------------------------------------------------------------------------------------------------ Recent Labs    11/16/18 0155  FERRITIN 179  TIBC 277  IRON 24*    Coagulation profile Recent Labs   Lab 11/16/18 0155  INR 1.1    No results for input(s): DDIMER in the last 72 hours.  Cardiac Enzymes No results for input(s): CKMB, TROPONINI, MYOGLOBIN in the last 168 hours.  Invalid input(s): CK ------------------------------------------------------------------------------------------------------------------    Component Value Date/Time   BNP 1,378.9 (H) 11/15/2018 2304    Micro Results Recent Results (from the past 240 hour(s))  SARS Coronavirus 2 [employer] order, Performed in [facility] hospital lab) Nasopharyngeal Nasopharyngeal Swab     Status: None   Collection Time: 11/15/18 11:15 PM   Specimen: Nasopharyngeal Swab  Result Value Ref Range Status   SARS Coronavirus 2 NEGATIVE NEGATIVE Final    Comment: (NOTE) If result is NEGATIVE SARS-CoV-2 target nucleic acids are NOT DETECTED. The SARS-CoV-2 RNA is generally detectable in upper and lower  respiratory specimens during the acute phase of infection. The lowest  concentration of SARS-CoV-2 viral copies this assay can detect is 250  copies / mL. A negative result does not preclude SARS-CoV-2 infection  and should not be used as the sole basis for treatment or other  patient management decisions.  A negative result may occur with  improper specimen collection / handling, submission of specimen other  than nasopharyngeal swab, presence of viral mutation(s) within the  areas targeted by this assay, and inadequate number of viral copies  (<250 copies / mL). A negative result must be combined with clinical  observations, patient history, and epidemiological information. If result is POSITIVE SARS-CoV-2 target nucleic acids are DETECTED. The SARS-CoV-2 RNA is generally detectable in upper and lower  respiratory specimens dur ing the acute phase of infection.  Positive  results are indicative of active infection with SARS-CoV-2.  Clinical  correlation with patient history and other diagnostic information is  necessary to  determine patient infection status.  Positive results do  not rule out bacterial infection or co-infection with other viruses. If result is PRESUMPTIVE POSTIVE SARS-CoV-2 nucleic acids MAY BE PRESENT.   A presumptive positive result was obtained on the submitted specimen  and confirmed on repeat testing.  While 2019 novel coronavirus  (SARS-CoV-2) nucleic acids may be present in the submitted sample  additional confirmatory testing may be necessary for epidemiological  and / or clinical management purposes  to differentiate between  SARS-CoV-2 and other Sarbecovirus currently known to infect humans.  If clinically indicated additional testing with an alternate test  methodology [phone]) is advised. The SARS-CoV-2 RNA is generally  detectable in upper and lower respiratory sp ecimens during the acute  phase of infection. The expected result is Negative. Fact Sheet for Patients:  StrictlyIdeas.no Fact Sheet for Healthcare Providers: BankingDealers.co.za This test is not yet approved or cleared by the [place] FDA and has been authorized for detection and/or diagnosis of SARS-CoV-2 by FDA under an Emergency Use Authorization (EUA).  This EUA will remain in effect (meaning this test can be used) for the duration of the COVID-19 declaration under Section 564(b)(1) of the Act, 21 U.S.C. section 360bbb-3(b)(1), unless the authorization is terminated or revoked sooner. Performed at [facility], [place] [address] [place], [place] [address]     Radiology Reports US  Renal  Result Date: 11/16/2018 CLINICAL DATA:  AKI with elevated BUN and creatinine EXAM: RENAL / URINARY TRACT ULTRASOUND COMPLETE COMPARISON:  Ultrasound 03/10/2018 FINDINGS: Right Kidney: Renal measurements: 10.3 x 4.6 x 4.7 cm = volume: 114 mL . Echogenicity within normal limits. No mass or hydronephrosis visualized. Left Kidney: Renal measurements: 9.4 x 5.6 x 4.6 cm =  volume: 125 mL. Echogenicity within normal limits. No mass or hydronephrosis visualized. Previously seen anechoic cyst in the interpolar region is no longer visualized. Bladder: Appears normal for degree of bladder distention. Other: Bilateral pleural effusions. IMPRESSION: Incidentally noted bilateral pleural effusions. Previously seen left renal cyst no longer visualized. Otherwise unremarkable renal ultrasound. Electronically Signed   By: Lovena Le M.D.   On: 11/16/2018 02:21   Dg Chest Port 1 View  Result Date: 11/15/2018 CLINICAL DATA:  Shortness of breath EXAM: PORTABLE CHEST 1 VIEW COMPARISON:  03/10/2018 FINDINGS: Cardiac shadow remains mildly enlarged. Diffuse vascular congestion is noted with interstitial edema and bilateral effusions. Bibasilar atelectasis is noted as well. IMPRESSION: Changes consistent with CHF with bibasilar atelectasis. Electronically Signed   By: Inez Catalina M.D.   On: 11/15/2018 23:20    Time Spent in minutes  30   Lala Lund M.D on 11/16/2018 at 11:58 AM  To page go to [email or web address] - password [facility]

## 2018-11-16 NOTE — H&P (Addendum)
History and Physical    Max Sanchez [id number] DOB: 02-03-1966 DOA: 11/15/2018  Referring MD/NP/PA:   PCP: Scot Jun, FNP   Patient coming from:  The patient is coming from home.  At baseline, pt is independent for most of ADL.        Chief Complaint: SOB  HPI: Max Sanchez is a 53 y.o. male with medical history significant of HTN, HLD, DM, h/o CVA (03/2018), CKD-IV, anemia, dCHF,who presents with shortness of breath.  Patient speaks Spanish, history is obtained with her daughter's help who speaks good [place]. Pt has hx of CKD-IV, he is supposed to be referred to nephrology by PCP but was not seen yet. He developed shortness of breath in the past several days, which has been progressively worsening.  She has mild cough, but no chest pain.  No fever or chills.  Her oxygen saturation was down to 82% on room air which improved to sats 99% on 10L HFNC  in ED.  Patient does not have nausea, vomiting, diarrhea, abdominal pain, symptoms of UTI or unilateral weakness. His chronic bilateral lower leg edema has also worsened the past few days.  ED Course: pt was found to have BUN 110, Cr 11.3, Bicarb 17, K 5.1, sodium 132, negative COVID-19, WBC 5.4, Hgb 8.3, troponin 25, BNP 1378, initial BPs 200/123, heart rate upper 80s, no fever, chest x-ray showed pulmonary edema.  Patient is admitted to stepdown C inpatient.  Review of Systems:   General: no fevers, chills, no body weight gain, has poor appetite, has fatigue HEENT: no blurry vision, hearing changes or sore throat Respiratory: has dyspnea, coughing, no wheezing CV: no chest pain, no palpitations GI: no nausea, vomiting, abdominal pain, diarrhea, constipation GU: no dysuria, burning on urination, increased urinary frequency, hematuria  Ext: has leg edema Neuro: no unilateral weakness, numbness, or tingling, no vision change or hearing loss Skin: no rash, no skin tear. MSK: No muscle spasm, no deformity, no limitation of  range of movement in spin Heme: No easy bruising.  Travel history: No recent long distant travel.  Allergy: No Known Allergies  Past Medical History:  Diagnosis Date  . CKD (chronic kidney disease), stage III ([place]) 03/10/2018  . Diabetic neuropathy ([place])   . History of CVA (cerebrovascular accident) 03/10/2018  . Hyperlipidemia 03/19/2018  . Hypertension 03/10/2018  . Hypertensive retinopathy of left eye   . 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 Type 2 diabetes mellitus with complication, without long-term current use of insulin [facility])     Past Surgical History:  Procedure Laterality Date  . EYE SURGERY Bilateral   . FOOT SURGERY    . PR RPR COMPLEX RETINA DETACH VITRECT &MEMBRANE PEEL Left 10/01/2016  . PR RPR COMPLEX RETINA DETACH VITRECT &MEMBRANE PEEL Right 12/03/2016  . PR RPR COMPLEX RETINA DETACH VITRECT &MEMBRANE PEEL Right 04/01/2017  . PR XCAPSL CTRC RMVL INSJ IO LENS PROSTH W/O ECP Right 12/03/2016    Social History:  reports that he has quit smoking. He has never used smokeless tobacco. He reports previous alcohol use. He reports previous drug use.  Family History:  Family History  Problem Relation Age of Onset  . Diabetes Father   . Diabetes Brother   . Kidney disease Brother      Prior to Admission medications   Medication Sig Start Date End Date Taking? Authorizing Provider  amLODipine (NORVASC) 5 MG tablet Take 1 tablet (5 mg total)  by mouth daily for 30 days. 04/26/18 05/26/18  Florencia Reasons, MD  atorvastatin (LIPITOR) 40 MG tablet Take 1 tablet (40 mg total) by mouth daily at 6 PM. 04/22/18   Scot Jun, FNP  gabapentin (NEURONTIN) 100 MG capsule Take 1 capsule (100 mg total) by mouth 3 (three) times daily. 04/22/18   Scot Jun, FNP  glipiZIDE (GLUCOTROL XL) 5 MG 24 hr tablet Take 1 tablet (5 mg total) by mouth daily with breakfast. 04/22/18   Scot Jun, FNP  hydrALAZINE (APRESOLINE) 100  MG tablet Take 0.5 tablets (50 mg total) by mouth 2 (two) times daily. 04/26/18   Florencia Reasons, MD  labetalol (NORMODYNE) 200 MG tablet Take 1 tablet (200 mg total) by mouth 3 (three) times daily. 04/26/18   Florencia Reasons, MD    Physical Exam: Vitals:   11/16/18 0330 11/16/18 0345 11/16/18 0445 11/16/18 0500  BP: (!) 162/84 (!) 166/84 139/89 (!) 145/82  Pulse: 84 83 77 81  Resp: 17 16 14 12   SpO2: 100% 100% 100% 100%  Weight:      Height:       General: Not in acute distress HEENT:       Eyes: PERRL, EOMI, no scleral icterus.       ENT: No discharge from the ears and nose, no pharynx injection, no tonsillar enlargement.        Neck: positive JVD, no bruit, no mass felt. Heme: No neck lymph node enlargement. Cardiac: S1/S2, RRR, No murmurs, No gallops or rubs. Respiratory: has crackles bilaterally at the base GI: Soft, nondistended, nontender, no rebound pain, no organomegaly, BS present. GU: No hematuria Ext: 2+ pitting leg edema bilaterally. 2+DP/PT pulse bilaterally. Musculoskeletal: No joint deformities, No joint redness or warmth, no limitation of ROM in spin. Skin: No rashes.  Neuro: Alert, oriented X3, cranial nerves II-XII grossly intact, moves all extremities normally.  Psych: Patient is not psychotic, no suicidal or hemocidal ideation.  Labs on Admission: I have personally reviewed following labs and imaging studies  CBC: Recent Labs  Lab 11/15/18 2304  WBC 5.4  NEUTROABS 3.7  HGB 8.3*  HCT 24.2*  MCV 87.1  PLT [id number]   Basic Metabolic Panel: Recent Labs  Lab 11/15/18 2304  NA 132*  K 5.1  CL 101  CO2 17*  GLUCOSE 178*  BUN 110*  CREATININE 11.30*  CALCIUM 7.2*   GFR: Estimated Creatinine Clearance: 7.6 mL/min (A) (by C-G formula based on SCr of 11.3 mg/dL (H)). Liver Function Tests: Recent Labs  Lab 11/15/18 2304  AST 16  ALT 21  ALKPHOS 72  BILITOT 0.6  PROT 6.7  ALBUMIN 3.1*   No results for input(s): LIPASE, AMYLASE in the last 168 hours. No  results for input(s): AMMONIA in the last 168 hours. Coagulation Profile: Recent Labs  Lab 11/16/18 0155  INR 1.1   Cardiac Enzymes: No results for input(s): CKTOTAL, CKMB, CKMBINDEX, TROPONINI in the last 168 hours. BNP (last 3 results) No results for input(s): PROBNP in the last 8760 hours. HbA1C: No results for input(s): HGBA1C in the last 72 hours. CBG: Recent Labs  Lab 11/16/18 0231  GLUCAP 142*   Lipid Profile: Recent Labs    11/16/18 0147  CHOL 260*  HDL 40*  LDLCALC 189*  TRIG 153*  CHOLHDL 6.5   Thyroid Function Tests: No results for input(s): TSH, T4TOTAL, FREET4, T3FREE, THYROIDAB in the last 72 hours. Anemia Panel: Recent Labs    11/16/18 0155  FERRITIN 179  TIBC 277  IRON 24*   Urine analysis:    Component Value Date/Time   COLORURINE STRAW (A) 11/16/2018 0147   APPEARANCEUR CLEAR 11/16/2018 0147   LABSPEC 1.010 11/16/2018 0147   PHURINE 6.0 11/16/2018 0147   GLUCOSEU 150 (A) 11/16/2018 0147   HGBUR SMALL (A) 11/16/2018 0147   BILIRUBINUR NEGATIVE 11/16/2018 0147   BILIRUBINUR negative 08/29/2015 1652   KETONESUR NEGATIVE 11/16/2018 0147   PROTEINUR >=300 (A) 11/16/2018 0147   UROBILINOGEN 0.2 08/29/2015 1652   NITRITE NEGATIVE 11/16/2018 0147   LEUKOCYTESUR NEGATIVE 11/16/2018 0147   Sepsis Labs: @LABRCNTIP (procalcitonin:4,lacticidven:4) ) Recent Results (from the past 240 hour(s))  SARS Coronavirus 2 [facility] order, Performed in [facility] hospital lab) Nasopharyngeal Nasopharyngeal Swab     Status: None   Collection Time: 11/15/18 11:15 PM   Specimen: Nasopharyngeal Swab  Result Value Ref Range Status   SARS Coronavirus 2 NEGATIVE NEGATIVE Final    Comment: (NOTE) If result is NEGATIVE SARS-CoV-2 target nucleic acids are NOT DETECTED. The SARS-CoV-2 RNA is generally detectable in upper and lower  respiratory specimens during the acute phase of infection. The lowest  concentration of SARS-CoV-2 viral copies this assay can detect  is 250  copies / mL. A negative result does not preclude SARS-CoV-2 infection  and should not be used as the sole basis for treatment or other  patient management decisions.  A negative result may occur with  improper specimen collection / handling, submission of specimen other  than nasopharyngeal swab, presence of viral mutation(s) within the  areas targeted by this assay, and inadequate number of viral copies  (<250 copies / mL). A negative result must be combined with clinical  observations, patient history, and epidemiological information. If result is POSITIVE SARS-CoV-2 target nucleic acids are DETECTED. The SARS-CoV-2 RNA is generally detectable in upper and lower  respiratory specimens dur ing the acute phase of infection.  Positive  results are indicative of active infection with SARS-CoV-2.  Clinical  correlation with patient history and other diagnostic information is  necessary to determine patient infection status.  Positive results do  not rule out bacterial infection or co-infection with other viruses. If result is PRESUMPTIVE POSTIVE SARS-CoV-2 nucleic acids MAY BE PRESENT.   A presumptive positive result was obtained on the submitted specimen  and confirmed on repeat testing.  While 2019 novel coronavirus  (SARS-CoV-2) nucleic acids may be present in the submitted sample  additional confirmatory testing may be necessary for epidemiological  and / or clinical management purposes  to differentiate between  SARS-CoV-2 and other Sarbecovirus currently known to infect humans.  If clinically indicated additional testing with an alternate test  methodology [phone]) is advised. The SARS-CoV-2 RNA is generally  detectable in upper and lower respiratory sp ecimens during the acute  phase of infection. The expected result is Negative. Fact Sheet for Patients:  StrictlyIdeas.no Fact Sheet for Healthcare Providers:  BankingDealers.co.za This test is not yet approved or cleared by the [place] FDA and has been authorized for detection and/or diagnosis of SARS-CoV-2 by FDA under an Emergency Use Authorization (EUA).  This EUA will remain in effect (meaning this test can be used) for the duration of the COVID-19 declaration under Section 564(b)(1) of the Act, 21 U.S.C. section 360bbb-3(b)(1), unless the authorization is terminated or revoked sooner. Performed at [facility], [place] [address] [place], [place] [address]      Radiological Exams on Admission: US Renal  Result Date: 11/16/2018 CLINICAL DATA:  AKI with elevated BUN and creatinine EXAM: RENAL / URINARY TRACT ULTRASOUND COMPLETE COMPARISON:  Ultrasound 03/10/2018 FINDINGS: Right Kidney: Renal measurements: 10.3 x 4.6 x 4.7 cm = volume: 114 mL . Echogenicity within normal limits. No mass or hydronephrosis visualized. Left Kidney: Renal measurements: 9.4 x 5.6 x 4.6 cm = volume: 125 mL. Echogenicity within normal limits. No mass or hydronephrosis visualized. Previously seen anechoic cyst in the interpolar region is no longer visualized. Bladder: Appears normal for degree of bladder distention. Other: Bilateral pleural effusions. IMPRESSION: Incidentally noted bilateral pleural effusions. Previously seen left renal cyst no longer visualized. Otherwise unremarkable renal ultrasound. Electronically Signed   By: Lovena Le M.D.   On: 11/16/2018 02:21   Dg Chest Port 1 View  Result Date: 11/15/2018 CLINICAL DATA:  Shortness of breath EXAM: PORTABLE CHEST 1 VIEW COMPARISON:  03/10/2018 FINDINGS: Cardiac shadow remains mildly enlarged. Diffuse vascular congestion is noted with interstitial edema and bilateral effusions. Bibasilar atelectasis is noted as well. IMPRESSION: Changes consistent with CHF with bibasilar atelectasis. Electronically Signed   By: Inez Catalina M.D.   On: 11/15/2018 23:20     EKG: Independently  reviewed.  QTc 520, low voltage, nonspecific T wave change.   Assessment/Plan Principal Problem:   Fluid overload Active Problems:   Hypertension   Hyperlipidemia   Stroke ([place])   Acute renal failure superimposed on stage 4 chronic kidney disease (HCC)   Type II diabetes mellitus with renal manifestations (HCC)   ESRD (end stage renal disease) (HCC)   Hypertensive urgency   Acute respiratory failure with hypoxia (HCC)   Acute on chronic diastolic (congestive) heart failure (HCC)   Elevated troponin   Anemia in CKD (chronic kidney disease)   Acute respiratory failure with hypoxia due to fluid overload/pulmonary edema and acute on chronic diastolic (congestive) heart failure: 2D echo 03/10/2018 showed EF of 55 to 60% with grade 1 diastolic dysfunction.  Patient has 2+ leg edema, positive JVD in the pulmonary edema chest x-ray, consistent with fluid overload/pulmonary edema and CHF exacerbation.  This is most likely due to worsening renal function.  Patient has history of stage IV CKD, now reached end-stage renal disease. Dr. Johnney Ou of renal was consulted. She recommended to treat patient with high dose of diuretics.  If patient does not respond, patient will need dialysis.  -will admit to SDU as inpt -highly appreciate Dr. Caprice Red consultation -pt was given IV lasix (40 mg and 160 mg) plus 5 mg metolazone in ED.  -Continue nasal cannula oxygen to maintain oxygen saturation above 93%.  If get worse, will start BiPAP.  AoCKD-IV vs. ESRD: BUN 110, Cr 11.3, Bicarb 17, K 5.1. Recent baseline Cre was ~4.0. UA negative for UTI, but positive for protein >=300. Renal US was unremarkable on 11/16/2018 -see above -f/u renal recommendations  Hypertension and Hypertensive urgency: Blood pressure 217/123. Pt is only taking labetalol.  He was on amlodipine 5 mg daily and hydralazine 50 mg twice daily before. -IV hydralazine as needed -Increase the dose of amlodipine from 5 to 10 mg daily -Increase the  dose of hydralazine from 50 mg twice daily to 3 times daily -Continue labetalol  Hyperlipidemia: pt was on lipitor, but he is not taking this med. - will start lipitor due to elevated trop  Stroke [facility]): -ASA and lipitor  Type II diabetes mellitus with renal manifestations ([place]): Last A1c 6.7 on 04/22/18, well controled. Patient is not taking his glipizide currently. -SSI  Anemia in CKD: Hgb 8.3 (9.8  on 04/22/18). -Dr. Johnney Ou ordered iron indices.  May benefit from ESA administration this admission.  Elevated troponin: trop 25 -->174. No CP. Likely due to ESRD and demanding ischemia. - Trend Trop - Repeat EKG in the am  - prn Nitroglycerin, Morphine, and aspirin, lipitor  - Risk factor stratification: will check FLP and A1C  - check UDS   Inpatient status:  # Patient requires inpatient status due to high intensity of service, high risk for further deterioration and high frequency of surveillance required.  I certify that at the point of admission it is my clinical judgment that the patient will require inpatient hospital care spanning beyond 2 midnights from the point of admission.  . This patient has multiple chronic comorbidities including HTN, HLD, DM, h/o CVA (03/2018), CKD-IV, anemia, dCHF . Now patient has presenting with acute respiratory failure with hypoxia due to fluid overload/pulmonary edema and acute on chronic diastolic CHF, worsening renal function, elevated troponin, hypertensive urgency . The worrisome physical exam findings include 2+ bilateral leg edema, bilateral crackles on lung auscultation, positive JVD. Marland Kitchen The initial radiographic and laboratory data are worrisome because of elevated troponin, elevated BNP, pulmonary edema on chest x-ray . Current medical needs: please see my assessment and plan . Predictability of an adverse outcome (risk): Patient's multiple completely risk for now presents with acute respiratory failure with hypoxia due to fluid  overload/pulmonary edema and acute on chronic diastolic CHF, elevated troponin, hypertensive urgency and worsening renal function. Pt's presentation is highly complicated.  Patient may need to start hemodialysis.  Patient is at high risk for deteriorating.  Will need to be treated in hospital for at least 2 days.      DVT ppx: SCD Code Status: Full code Family Communication:  Yes, patient's  Daughter at bed side Disposition Plan:  Anticipate discharge back to previous home environment Consults called:  Dr. Johnney Ou of renal Admission status:   SDU/inpation       Date of Service 11/16/2018    [facility]   If 7PM-7AM, please contact night-coverage [email or web address] Password [facility] 11/16/2018, 5:38 AM

## 2018-11-16 NOTE — Progress Notes (Signed)
  [facility] Progress Note   Assessment/ Plan:   1.  Acute Hypoxic respiratory failure:  Requiring HFNC O2 support currently with CXR c/w pulmonary edema in setting of worsening edema.  Did not respond significantly to Lasix 160 IV +metolazone 5mg  now.  He needs dialysis now--> discussed at length with pt and dtr.  NPO placed, have ordered nontunneled HD cath with IR for now for expediency. HD orders placed.    2.  AKI on CKD--> likely ESRD:  Pt with baseline Cr 4 in 03/2018 and 04/2018, now presenting with BUN > 100, Cr 11 with associated volume overload.  Renal US was normal in 03/2018 and without obstruction now.  HD ordered as above  3 Anemia:  Suspect anemia of CKD.  Iron deficient- will dose ferrlicet with HD  4 HTN:  Markedly hypertensive at admission. Now better. Being managed acutely with NTG paste, IV hydralazine.  Resume home meds and follow with diuresis.   5 BMM:  Check phos, PTH.   6 DM:  Recent A1cs controlled; care per primary.   7. Elevated trop: 25--> 168--> 525 now, possibly demand, EKG without specific changes, would do TTE + likely c/s cardiology in case cath needed after volume status is better.   Subjective:    Seen and examined.  Pt had some UOP last night and BP is somewhat better; however requiring 6 L O2, short of breath, can't lay completely flat.  Discussed with pt and dtr at length.  Needs HD--> agreeable to start.  Has brother with ESRD.     Objective:   BP (!) 153/85   Pulse 77   Resp 12   Ht 5\' 6"  (1.676 m)   Wt 80 kg   SpO2 100%   BMI 28.47 kg/m   Physical Exam: Gen: older gentleman, NAD, sitting in bed NECK: + JVD to mandible CVS: RRR no m/r/g Resp: mildly increased WOB, diffuse anterior and posterior crackles Abd: some fullness Ext: 3+ LE edema  Labs: BMET Recent Labs  Lab 11/15/18 2304  NA 132*  K 5.1  CL 101  CO2 17*  GLUCOSE 178*  BUN 110*  CREATININE 11.30*  CALCIUM 7.2*   CBC Recent Labs  Lab  11/15/18 2304  WBC 5.4  NEUTROABS 3.7  HGB 8.3*  HCT 24.2*  MCV 87.1  PLT 227    @IMGRELPRIORS @ Medications:    . amLODipine  10 mg Oral Daily  . aspirin EC  81 mg Oral Daily  . atorvastatin  40 mg Oral q1800  . Chlorhexidine Gluconate Cloth  6 each Topical Q0600  . hydrALAZINE  50 mg Oral Q8H  . insulin aspart  0-5 Units Subcutaneous QHS  . insulin aspart  0-9 Units Subcutaneous TID WC  . labetalol  200 mg Oral Daily  . sodium chloride flush  3 mL Intravenous Q12H     Madelon Lips, MD [facility] pgr [phone] 11/16/2018, 8:11 AM

## 2018-11-16 NOTE — ED Notes (Signed)
Dinner tray ordered.

## 2018-11-16 NOTE — Progress Notes (Signed)
Upper extremity vein mapping has been completed.   Preliminary results in CV Proc.   Max Sanchez 11/16/2018 1:39 PM

## 2018-11-16 NOTE — Consult Note (Signed)
[facility]  INPATIENT CONSULTATION  Reason for Consultation: volume overload, AKI vs CKD Requesting Provider: Dr. Stark Jock  HPI: Max Sanchez is an 53 y.o. male with HTN, DM, h/o CVA (03/2018), CKD who presented to the ED today with hypoxia.  Nephrology is consulted for AKI on CKD.   Last available labs show Cr 4.2 in 04/2018, was 4 in 03/2018.  Pt was supposed to be referred to nephrology by PCP but was not seen.  Developed worsening dyspnea and orthopnea in the past few days and presented to the ED via EMS this PM.  Chronic LE edema also worse in the past few days.  In the ED initial BPs 200/100s.  BUN 110, Cr 11.3, Bicarb 17, K 5.1.  Sats 99% on 10L HFNC.  Port CXR with pulm edema.  COVID 19 rapid negative.  Hb 8.3, WBC 5.4.   He denies any dysgeusia, nausea, emesis, hiccups, pruritis.  Denies difficulty voiding but endorses less urine volume lately.  No hematuria or dysuria.  No NSAID use.   PMH: Past Medical History:  Diagnosis Date  . CKD (chronic kidney disease), stage III ([place]) 03/10/2018  . Diabetic neuropathy ([place])   . History of CVA (cerebrovascular accident) 03/10/2018  . Hyperlipidemia 03/19/2018  . Hypertension 03/10/2018  . Hypertensive retinopathy of left eye   . 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 Type 2 diabetes mellitus with complication, without long-term current use of insulin ([facility])    PSH: Past Surgical History:  Procedure Laterality Date  . EYE SURGERY Bilateral   . FOOT SURGERY    . PR RPR COMPLEX RETINA DETACH VITRECT &MEMBRANE PEEL Left 10/01/2016  . PR RPR COMPLEX RETINA DETACH VITRECT &MEMBRANE PEEL Right 12/03/2016  . PR RPR COMPLEX RETINA DETACH VITRECT &MEMBRANE PEEL Right 04/01/2017  . PR XCAPSL CTRC RMVL INSJ IO LENS PROSTH W/O ECP Right 12/03/2016    Past Medical History:  Diagnosis Date  . CKD (chronic kidney disease), stage III ([place]) 03/10/2018  . Diabetic neuropathy ([place])   .  History of CVA (cerebrovascular accident) 03/10/2018  . Hyperlipidemia 03/19/2018  . Hypertension 03/10/2018  . Hypertensive retinopathy of left eye   . 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 Type 2 diabetes mellitus with complication, without long-term current use of insulin (HCC)     Medications:  I have reviewed the patient's current medications.  (Not in a hospital admission)   ALLERGIES:  No Known Allergies  FAM HX: Family History  Problem Relation Age of Onset  . Diabetes Father   . Diabetes Brother   . Kidney disease Brother     Social History:   reports that he has quit smoking. He has never used smokeless tobacco. He reports previous alcohol use. He reports previous drug use.  ROS: 12 system ROS negative except per HPI above  Blood pressure (!) 212/108, pulse 85, resp. rate (!) 23, height 5\' 6"  (1.676 m), weight 80 kg, SpO2 96 %. PHYSICAL EXAM: Gen: comfortable at 45 degrees on stretcher  Eyes: anicteric ENT: MMM Neck: supple, elevated JVD at 45 degrees CV:  RRR, no rub, II/VI syst murmur Abd: soft, nontender Back: dec BS bases, rales to mid lung fields, normal WOB at rest GU: no foley Extr:  2+ pitting edema to knees Neuro: no asterixis Skin: no rashes   Results for orders placed or performed during the hospital encounter of  11/15/18 (from the past 48 hour(s))  Comprehensive metabolic panel     Status: Abnormal   Collection Time: 11/15/18 11:04 PM  Result Value Ref Range   Sodium 132 (L) 135 - 145 mmol/L   Potassium 5.1 3.5 - 5.1 mmol/L   Chloride 101 98 - 111 mmol/L   CO2 17 (L) 22 - 32 mmol/L   Glucose, Bld 178 (H) 70 - 99 mg/dL   BUN 110 (H) 6 - 20 mg/dL   Creatinine, Ser 11.30 (H) 0.61 - 1.24 mg/dL   Calcium 7.2 (L) 8.9 - 10.3 mg/dL   Total Protein 6.7 6.5 - 8.1 g/dL   Albumin 3.1 (L) 3.5 - 5.0 g/dL   AST 16 15 - 41 U/L   ALT 21 0 - 44 U/L   Alkaline Phosphatase 72 38 - 126 U/L   Total  Bilirubin 0.6 0.3 - 1.2 mg/dL   GFR calc non Af Amer 5 (L) >60 mL/min   GFR calc Af Amer 5 (L) >60 mL/min   Anion gap 14 5 - 15    Comment: Performed at [facility], [address] [place], [place] [address]  CBC with Differential     Status: Abnormal   Collection Time: 11/15/18 11:04 PM  Result Value Ref Range   WBC 5.4 4.0 - 10.5 K/uL   RBC 2.78 (L) 4.22 - 5.81 MIL/uL   Hemoglobin 8.3 (L) 13.0 - 17.0 g/dL   HCT 24.2 (L) 39.0 - 52.0 %   MCV 87.1 80.0 - 100.0 fL   MCH 29.9 26.0 - 34.0 pg   MCHC 34.3 30.0 - 36.0 g/dL   RDW 12.9 11.5 - 15.5 %   Platelets 227 150 - 400 K/uL   nRBC 0.0 0.0 - 0.2 %   Neutrophils Relative % 68 %   Neutro Abs 3.7 1.7 - 7.7 K/uL   Lymphocytes Relative 19 %   Lymphs Abs 1.0 0.7 - 4.0 K/uL   Monocytes Relative 9 %   Monocytes Absolute 0.5 0.1 - 1.0 K/uL   Eosinophils Relative 3 %   Eosinophils Absolute 0.2 0.0 - 0.5 K/uL   Basophils Relative 1 %   Basophils Absolute 0.0 0.0 - 0.1 K/uL   Immature Granulocytes 0 %   Abs Immature Granulocytes 0.01 0.00 - 0.07 K/uL    Comment: Performed at [facility] [address] [place], [place] [address]  Troponin I (High Sensitivity)     Status: Abnormal   Collection Time: 11/15/18 11:04 PM  Result Value Ref Range   Troponin I (High Sensitivity) 25 (H) <18 ng/L    Comment: (NOTE) Elevated high sensitivity troponin I (hsTnI) values and significant  changes across serial measurements may suggest ACS but many other  chronic and acute conditions are known to elevate hsTnI results.  Refer to the Links section for chest pain algorithms and additional  guidance. Performed at [facility], [place] [address] [place], [place] [address]   Brain natriuretic peptide     Status: Abnormal   Collection Time: 11/15/18 11:04 PM  Result Value Ref Range   B Natriuretic Peptide 1,378.9 (H) 0.0 - 100.0 pg/mL    Comment: Performed at [facility] [address] [place], [place] [address]  SARS  Coronavirus 2 [facility] order, Performed in [facility] hospital lab) Nasopharyngeal Nasopharyngeal Swab     Status: None   Collection Time: 11/15/18 11:15 PM   Specimen: Nasopharyngeal Swab  Result Value Ref Range  SARS Coronavirus 2 NEGATIVE NEGATIVE    Comment: (NOTE) If result is NEGATIVE SARS-CoV-2 target nucleic acids are NOT DETECTED. The SARS-CoV-2 RNA is generally detectable in upper and lower  respiratory specimens during the acute phase of infection. The lowest  concentration of SARS-CoV-2 viral copies this assay can detect is 250  copies / mL. A negative result does not preclude SARS-CoV-2 infection  and should not be used as the sole basis for treatment or other  patient management decisions.  A negative result may occur with  improper specimen collection / handling, submission of specimen other  than nasopharyngeal swab, presence of viral mutation(s) within the  areas targeted by this assay, and inadequate number of viral copies  (<250 copies / mL). A negative result must be combined with clinical  observations, patient history, and epidemiological information. If result is POSITIVE SARS-CoV-2 target nucleic acids are DETECTED. The SARS-CoV-2 RNA is generally detectable in upper and lower  respiratory specimens dur ing the acute phase of infection.  Positive  results are indicative of active infection with SARS-CoV-2.  Clinical  correlation with patient history and other diagnostic information is  necessary to determine patient infection status.  Positive results do  not rule out bacterial infection or co-infection with other viruses. If result is PRESUMPTIVE POSTIVE SARS-CoV-2 nucleic acids MAY BE PRESENT.   A presumptive positive result was obtained on the submitted specimen  and confirmed on repeat testing.  While 2019 novel coronavirus  (SARS-CoV-2) nucleic acids may be present in the submitted sample  additional confirmatory testing may be necessary for  epidemiological  and / or clinical management purposes  to differentiate between  SARS-CoV-2 and other Sarbecovirus currently known to infect humans.  If clinically indicated additional testing with an alternate test  methodology [id number]) is advised. The SARS-CoV-2 RNA is generally  detectable in upper and lower respiratory sp ecimens during the acute  phase of infection. The expected result is Negative. Fact Sheet for Patients:  StrictlyIdeas.no Fact Sheet for Healthcare Providers: BankingDealers.co.za This test is not yet approved or cleared by the [place] FDA and has been authorized for detection and/or diagnosis of SARS-CoV-2 by FDA under an Emergency Use Authorization (EUA).  This EUA will remain in effect (meaning this test can be used) for the duration of the COVID-19 declaration under Section 564(b)(1) of the Act, 21 U.S.C. section 360bbb-3(b)(1), unless the authorization is terminated or revoked sooner. Performed at [facility], [place] [address] [place], [place] [address]     Dg Chest Port 1 View  Result Date: 11/15/2018 CLINICAL DATA:  Shortness of breath EXAM: PORTABLE CHEST 1 VIEW COMPARISON:  03/10/2018 FINDINGS: Cardiac shadow remains mildly enlarged. Diffuse vascular congestion is noted with interstitial edema and bilateral effusions. Bibasilar atelectasis is noted as well. IMPRESSION: Changes consistent with CHF with bibasilar atelectasis. Electronically Signed   By: Inez Catalina M.D.   On: 11/15/2018 23:20    Assessment/Plan  **Hypoxic respiratory failure:  Requiring HFNC O2 support currently with CXR c/w pulmonary edema in setting of worsening edema.  Lasix 160 IV +metolazone 5mg  now but if does not respond will need dialysis.  Hopefully can temporize with high dose diuretics to allow placement of TDC rather than temp cath as I suspect he will need ongoing dialysis.    **AKI on CKD vs ESRD:  Pt with baseline  Cr 4 in 03/2018 and 04/2018, now presenting with BUN > 100, Cr 11 with associated volume overload.  Renal US was  normal in 03/2018.  I suspect he will need dialysis this admission and may be ESRD but will do a basic evaluation including repeat renal UA, check UA -- depending on results may warrant further w/u.   Will make him NPO in case procedure is needed.  Vein mapping and hepatitis serologies ordered.   **Anemia:  Suspect anemia of CKD.  Check iron indices.  May benefit from ESA administration this admission.  **HTN:  Markedly hypertensive. Being managed acutely with NTG paste, IV hydralazine.  Resume home meds and follow with diuresis.   **BMM:  Check phos, PTH.   **DM:  Recent A1cs controlled; care per primary.    Justin Mend 11/16/2018, 12:58 AM

## 2018-11-17 ENCOUNTER — Encounter (HOSPITAL_COMMUNITY): Payer: Self-pay | Admitting: Radiology

## 2018-11-17 ENCOUNTER — Inpatient Hospital Stay (HOSPITAL_COMMUNITY): Payer: Medicaid Other

## 2018-11-17 DIAGNOSIS — I361 Nonrheumatic tricuspid (valve) insufficiency: Secondary | ICD-10-CM

## 2018-11-17 DIAGNOSIS — I340 Nonrheumatic mitral (valve) insufficiency: Secondary | ICD-10-CM

## 2018-11-17 LAB — BASIC METABOLIC PANEL
Anion gap: 18 — ABNORMAL HIGH (ref 5–15)
BUN: 116 mg/dL — ABNORMAL HIGH (ref 6–20)
CO2: 14 mmol/L — ABNORMAL LOW (ref 22–32)
Calcium: 7 mg/dL — ABNORMAL LOW (ref 8.9–10.3)
Chloride: 101 mmol/L (ref 98–111)
Creatinine, Ser: 12.6 mg/dL — ABNORMAL HIGH (ref 0.61–1.24)
GFR calc Af Amer: 5 mL/min — ABNORMAL LOW (ref >60)
GFR calc non Af Amer: 4 mL/min — ABNORMAL LOW (ref >60)
Glucose, Bld: 97 mg/dL (ref 70–99)
Potassium: 4.9 mmol/L (ref 3.5–5.1)
Sodium: 133 mmol/L — ABNORMAL LOW (ref 135–145)

## 2018-11-17 LAB — HEPATITIS B SURFACE ANTIBODY,QUALITATIVE: Hep B S Ab: NONREACTIVE

## 2018-11-17 LAB — GLUCOSE, CAPILLARY
Glucose-Capillary: 94 mg/dL (ref 70–99)
Glucose-Capillary: 204 mg/dL — ABNORMAL HIGH (ref 70–99)
Glucose-Capillary: 126 mg/dL — ABNORMAL HIGH (ref 70–99)
Glucose-Capillary: 142 mg/dL — ABNORMAL HIGH (ref 70–99)

## 2018-11-17 LAB — HEMOGLOBIN AND HEMATOCRIT, BLOOD
HCT: 21.3 % — ABNORMAL LOW (ref 39.0–52.0)
Hemoglobin: 7.3 g/dL — ABNORMAL LOW (ref 13.0–17.0)

## 2018-11-17 LAB — CBC
HCT: 20.9 % — ABNORMAL LOW (ref 39.0–52.0)
Hemoglobin: 7.1 g/dL — ABNORMAL LOW (ref 13.0–17.0)
MCH: 29.3 pg (ref 26.0–34.0)
MCHC: 34 g/dL (ref 30.0–36.0)
MCV: 86.4 fL (ref 80.0–100.0)
Platelets: 190 10*3/uL (ref 150–400)
RBC: 2.42 MIL/uL — ABNORMAL LOW (ref 4.22–5.81)
RDW: 13.2 % (ref 11.5–15.5)
WBC: 5.5 10*3/uL (ref 4.0–10.5)
nRBC: 0 % (ref 0.0–0.2)

## 2018-11-17 LAB — ECHOCARDIOGRAM COMPLETE
Height: 66 in
Weight: 2786.61 oz

## 2018-11-17 LAB — HEPATITIS B SURFACE ANTIGEN: Hepatitis B Surface Ag: NEGATIVE

## 2018-11-17 LAB — HEMOGLOBIN A1C
Hgb A1c MFr Bld: 6 % — ABNORMAL HIGH (ref 4.8–5.6)
Mean Plasma Glucose: 126 mg/dL

## 2018-11-17 LAB — PARATHYROID HORMONE, INTACT (NO CA): PTH: 138 pg/mL — ABNORMAL HIGH (ref 15–65)

## 2018-11-17 LAB — HEPATITIS B CORE ANTIBODY, TOTAL: Hep B Core Total Ab: NEGATIVE

## 2018-11-17 MED ORDER — HEPARIN SODIUM (PORCINE) 1000 UNIT/ML IJ SOLN
INTRAMUSCULAR | Status: AC
  Filled 2018-11-17: qty 3

## 2018-11-17 MED ORDER — PROMETHAZINE HCL 25 MG/ML IJ SOLN: 25.0000 mg | Freq: Four times a day (QID) | INTRAMUSCULAR | Status: DC | PRN

## 2018-11-17 MED ORDER — HEPARIN SODIUM (PORCINE) 1000 UNIT/ML IJ SOLN
2.6000 mL | Freq: Once | INTRAMUSCULAR | Status: AC
  Administered 2018-11-18: 2600 [IU] via INTRAVENOUS

## 2018-11-17 MED ORDER — ONDANSETRON HCL 4 MG/2ML IJ SOLN: 4.0000 mg | Freq: Four times a day (QID) | INTRAMUSCULAR | Status: DC | PRN

## 2018-11-17 NOTE — Progress Notes (Signed)
PROGRESS NOTE                                                                                                                                                                                                             Patient Demographics:    Max Sanchez, is a 53 y.o. male, DOB - September 24, 1965, [id number]  Admit date - 11/15/2018   Admitting Physician Ivor Costa, MD  Outpatient Primary MD for the patient is Scot Jun, FNP  LOS - 1  Chief Complaint  Patient presents with   Respiratory Distress   Shortness of Breath       Brief Narrative  Max Sanchez is a 53 y.o. male with medical history significant of HTN, HLD, DM, h/o CVA (03/2018), CKD-IV, anemia, dCHF,who presents with shortness of breath.  In the ER he was diagnosed with acute on chronic CHF due to fluid overload caused by renal failure.  Nephrology was consulted and we were requested to admit the patient.    Subjective:   Patient in bed, appears comfortable, denies any headache, no fever, no chest pain or pressure, no shortness of breath , no abdominal pain. No focal weakness.      Assessment  & Plan :     1.  Acute hypoxic respiratory failure due to fluid overload caused by ESRD intern causing acute on chronic diastolic CHF recent echo in January showed EF of 60%.  Seen by nephrology, did not respond to diuretics, dialysis started on 11/16/2018 with good effect shortness of breath has improved, echo pending.  2.  Hypertension.  Currently on combination of Norvasc, hydralazine and beta-blocker.  Will adjust as needed.  Currently blood pressure slightly on the lower side.  Will cut down beta-blocker into half.  3.  AKI on CKD 5 now progressed to ESRD.  Nephrology consulted, underwent right IJ dialysis catheter placement, dialysis was started on 11/16/2018 evening.  4.  Dyslipidemia.  Continue home dose statin.  5.  History of stroke.  Continue aspirin and statin for secondary prevention.    6.   Troponin elevation.  In non-ACS pattern due to stress caused by acute hypoxic respiratory failure and CHF, in the setting of ESRD.  Trend is flat, chest pain-free, EKG nonacute. Continue aspirin, beta-blocker and statin.  Will get baseline echocardiogram.  7. DM type II.  On sliding scale.  CBG (last 3)  Recent Labs    11/16/18 1657 11/16/18 2233 11/17/18 0849  GLUCAP 113* 118* 94  Lab Results  Component Value Date   HGBA1C 6.0 (H) 11/16/2018     Family Communication  : Daughter bedside 9/15, 9/16  Code Status : Full  Disposition Plan  : Telemetry  Consults  : Nephrology  Procedures  :    R.IJ HD cath - 9/15  TTE  DVT Prophylaxis  :    Heparin added  Lab Results  Component Value Date   PLT 227 11/15/2018    Diet :  Diet Order            Diet renal with fluid restriction Fluid restriction: 1200 mL Fluid; Room service appropriate? Yes; Fluid consistency: Thin  Diet effective now               Inpatient Medications Scheduled Meds:  amLODipine  10 mg Oral Daily   aspirin EC  81 mg Oral Daily   atorvastatin  40 mg Oral q1800   Chlorhexidine Gluconate Cloth  6 each Topical Q0600   heparin       heparin injection (subcutaneous)  5,000 Units Subcutaneous Q8H   hydrALAZINE  50 mg Oral Q8H   insulin aspart  0-5 Units Subcutaneous QHS   insulin aspart  0-9 Units Subcutaneous TID WC   labetalol  100 mg Oral Daily   sodium chloride flush  3 mL Intravenous Q12H   Continuous Infusions:  sodium chloride     sodium chloride     sodium chloride     PRN Meds:.sodium chloride, sodium chloride, sodium chloride, acetaminophen, alteplase, heparin, hydrALAZINE, lidocaine (PF), lidocaine-prilocaine, morphine injection, nitroGLYCERIN, pentafluoroprop-tetrafluoroeth, promethazine, sodium chloride flush  Antibiotics  :   Anti-infectives (From admission, onward)   None          Objective:   Vitals:   11/17/18 0500 11/17/18 0530 11/17/18 0553  11/17/18 0821  BP: 132/70 130/70 (!) 150/78 (!) 153/74  Pulse: 78 76 80 87  Resp: 18 18 18 17   Temp:   98 F (36.7 C) 97.7 F (36.5 C)  TempSrc:   Oral Oral  SpO2:   98% 98%  Weight:   79 kg   Height:        Wt Readings from Last 3 Encounters:  11/17/18 79 kg  04/22/18 80.3 kg  04/22/18 80.5 kg     Intake/Output Summary (Last 24 hours) at 11/17/2018 1220 Last data filed at 11/17/2018 1124 Gross per 24 hour  Intake 240 ml  Output 1000 ml  Net -760 ml     Physical Exam  Awake Alert,   No new F.N deficits,   [place].AT,PERRAL Supple Neck,No JVD, No cervical lymphadenopathy appriciated.  Symmetrical Chest wall movement, Good air movement bilaterally, few rales RRR,No Gallops, Rubs or new Murmurs, No Parasternal Heave +ve B.Sounds, Abd Soft, No tenderness, No organomegaly appriciated, No rebound - guarding or rigidity. No Cyanosis, Clubbing or edema, No new Rash or bruise      Data Review:    CBC Recent Labs  Lab 11/15/18 2304  WBC 5.4  HGB 8.3*  HCT 24.2*  PLT 227  MCV 87.1  MCH 29.9  MCHC 34.3  RDW 12.9  LYMPHSABS 1.0  MONOABS 0.5  EOSABS 0.2  BASOSABS 0.0    Chemistries  Recent Labs  Lab 11/15/18 2304 11/16/18 1315 11/17/18 0340  NA 132* 132* 133*  K 5.1 4.8 4.9  CL 101 102 101  CO2 17* 15* 14*  GLUCOSE 178* 101* 97  BUN 110* 116* 116*  CREATININE 11.30* 11.84* 12.60*  CALCIUM 7.2*  7.0* 7.0*  AST 16  --   --   ALT 21  --   --   ALKPHOS 72  --   --   BILITOT 0.6  --   --    ------------------------------------------------------------------------------------------------------------------ Recent Labs    11/16/18 0147  CHOL 260*  HDL 40*  LDLCALC 189*  TRIG 153*  CHOLHDL 6.5    Lab Results  Component Value Date   HGBA1C 6.0 (H) 11/16/2018   ------------------------------------------------------------------------------------------------------------------ No results for input(s): TSH, T4TOTAL, T3FREE, THYROIDAB in the last 72  hours.  Invalid input(s): FREET3 ------------------------------------------------------------------------------------------------------------------ Recent Labs    11/16/18 0155  FERRITIN 179  TIBC 277  IRON 24*    Coagulation profile Recent Labs  Lab 11/16/18 0155  INR 1.1    No results for input(s): DDIMER in the last 72 hours.  Cardiac Enzymes No results for input(s): CKMB, TROPONINI, MYOGLOBIN in the last 168 hours.  Invalid input(s): CK ------------------------------------------------------------------------------------------------------------------    Component Value Date/Time   BNP 1,378.9 (H) 11/15/2018 2304    Micro Results Recent Results (from the past 240 hour(s))  SARS Coronavirus 2 [facility] order, Performed in [facility] hospital lab) Nasopharyngeal Nasopharyngeal Swab     Status: None   Collection Time: 11/15/18 11:15 PM   Specimen: Nasopharyngeal Swab  Result Value Ref Range Status   SARS Coronavirus 2 NEGATIVE NEGATIVE Final    Comment: (NOTE) If result is NEGATIVE SARS-CoV-2 target nucleic acids are NOT DETECTED. The SARS-CoV-2 RNA is generally detectable in upper and lower  respiratory specimens during the acute phase of infection. The lowest  concentration of SARS-CoV-2 viral copies this assay can detect is 250  copies / mL. A negative result does not preclude SARS-CoV-2 infection  and should not be used as the sole basis for treatment or other  patient management decisions.  A negative result may occur with  improper specimen collection / handling, submission of specimen other  than nasopharyngeal swab, presence of viral mutation(s) within the  areas targeted by this assay, and inadequate number of viral copies  (<250 copies / mL). A negative result must be combined with clinical  observations, patient history, and epidemiological information. If result is POSITIVE SARS-CoV-2 target nucleic acids are DETECTED. The SARS-CoV-2 RNA is generally  detectable in upper and lower  respiratory specimens dur ing the acute phase of infection.  Positive  results are indicative of active infection with SARS-CoV-2.  Clinical  correlation with patient history and other diagnostic information is  necessary to determine patient infection status.  Positive results do  not rule out bacterial infection or co-infection with other viruses. If result is PRESUMPTIVE POSTIVE SARS-CoV-2 nucleic acids MAY BE PRESENT.   A presumptive positive result was obtained on the submitted specimen  and confirmed on repeat testing.  While 2019 novel coronavirus  (SARS-CoV-2) nucleic acids may be present in the submitted sample  additional confirmatory testing may be necessary for epidemiological  and / or clinical management purposes  to differentiate between  SARS-CoV-2 and other Sarbecovirus currently known to infect humans.  If clinically indicated additional testing with an alternate test  methodology [phone]) is advised. The SARS-CoV-2 RNA is generally  detectable in upper and lower respiratory sp ecimens during the acute  phase of infection. The expected result is Negative. Fact Sheet for Patients:  StrictlyIdeas.no Fact Sheet for Healthcare Providers: BankingDealers.co.za This test is not yet approved or cleared by the [place] FDA and has been authorized for detection and/or diagnosis of SARS-CoV-2  by FDA under an Emergency Use Authorization (EUA).  This EUA will remain in effect (meaning this test can be used) for the duration of the COVID-19 declaration under Section 564(b)(1) of the Act, 21 U.S.C. section 360bbb-3(b)(1), unless the authorization is terminated or revoked sooner. Performed at [facility], [place] [address] [place], [place] [address]     Radiology Reports US Renal  Result Date: 11/16/2018 CLINICAL DATA:  AKI with elevated BUN and creatinine EXAM: RENAL / URINARY TRACT  ULTRASOUND COMPLETE COMPARISON:  Ultrasound 03/10/2018 FINDINGS: Right Kidney: Renal measurements: 10.3 x 4.6 x 4.7 cm = volume: 114 mL . Echogenicity within normal limits. No mass or hydronephrosis visualized. Left Kidney: Renal measurements: 9.4 x 5.6 x 4.6 cm = volume: 125 mL. Echogenicity within normal limits. No mass or hydronephrosis visualized. Previously seen anechoic cyst in the interpolar region is no longer visualized. Bladder: Appears normal for degree of bladder distention. Other: Bilateral pleural effusions. IMPRESSION: Incidentally noted bilateral pleural effusions. Previously seen left renal cyst no longer visualized. Otherwise unremarkable renal ultrasound. Electronically Signed   By: Lovena Le M.D.   On: 11/16/2018 02:21   Ir Fluoro Guide Cv Line Right  Result Date: 11/17/2018 CLINICAL DATA:  Acute on chronic kidney disease EXAM: EXAM RIGHT IJ CATHETER PLACEMENT UNDER ULTRASOUND AND FLUOROSCOPIC GUIDANCE TECHNIQUE: The procedure, risks (including but not limited to bleeding, infection, organ damage, pneumothorax), benefits, and alternatives were explained to the patient. Questions regarding the procedure were encouraged and answered. The patient understands and consents to the procedure. Patency of the right IJ vein was confirmed with ultrasound with image documentation. An appropriate skin site was determined. Skin site was marked. Region was prepped using maximum barrier technique including cap and mask, sterile gown, sterile gloves, large sterile sheet, and Chlorhexidine as cutaneous antisepsis. The region was infiltrated locally with 1% lidocaine. Under real-time ultrasound guidance, the right IJ vein was accessed with a 19 gauge needle; the needle tip within the vein was confirmed with ultrasound image documentation. The needle exchanged over a guidewire for vascular dilator which allowed advancement of a 16 cm Trialysis catheter. This was positioned with the tip at the cavoatrial  junction. Spot chest radiograph shows good positioning and no pneumothorax. Catheter was flushed and sutured externally with 0-Prolene sutures. Patient tolerated the procedure well. FLUOROSCOPY TIME:  26 seconds, 1 image was saved. COMPLICATIONS: COMPLICATIONS none IMPRESSION: Technically successful right IJ Trialysis catheter placement. The catheter is ready for immediate use as clinically indicated. Electronically Signed   By: Constance Holster M.D.   On: 11/17/2018 08:04   Ir US Guide Vasc Access Right  Result Date: 11/17/2018 CLINICAL DATA:  Acute on chronic kidney disease EXAM: EXAM RIGHT IJ CATHETER PLACEMENT UNDER ULTRASOUND AND FLUOROSCOPIC GUIDANCE TECHNIQUE: The procedure, risks (including but not limited to bleeding, infection, organ damage, pneumothorax), benefits, and alternatives were explained to the patient. Questions regarding the procedure were encouraged and answered. The patient understands and consents to the procedure. Patency of the right IJ vein was confirmed with ultrasound with image documentation. An appropriate skin site was determined. Skin site was marked. Region was prepped using maximum barrier technique including cap and mask, sterile gown, sterile gloves, large sterile sheet, and Chlorhexidine as cutaneous antisepsis. The region was infiltrated locally with 1% lidocaine. Under real-time ultrasound guidance, the right IJ vein was accessed with a 19 gauge needle; the needle tip within the vein was confirmed with ultrasound image documentation. The needle exchanged over a guidewire for vascular  dilator which allowed advancement of a 16 cm Trialysis catheter. This was positioned with the tip at the cavoatrial junction. Spot chest radiograph shows good positioning and no pneumothorax. Catheter was flushed and sutured externally with 0-Prolene sutures. Patient tolerated the procedure well. FLUOROSCOPY TIME:  26 seconds, 1 image was saved. COMPLICATIONS: COMPLICATIONS none IMPRESSION:  Technically successful right IJ Trialysis catheter placement. The catheter is ready for immediate use as clinically indicated. Electronically Signed   By: Constance Holster M.D.   On: 11/17/2018 08:04   Dg Chest Port 1 View  Result Date: 11/15/2018 CLINICAL DATA:  Shortness of breath EXAM: PORTABLE CHEST 1 VIEW COMPARISON:  03/10/2018 FINDINGS: Cardiac shadow remains mildly enlarged. Diffuse vascular congestion is noted with interstitial edema and bilateral effusions. Bibasilar atelectasis is noted as well. IMPRESSION: Changes consistent with CHF with bibasilar atelectasis. Electronically Signed   By: Inez Catalina M.D.   On: 11/15/2018 23:20   Vas [place] Upper Ext Vein Mapping (pre-op Avf)  Result Date: 11/16/2018 UPPER EXTREMITY VEIN MAPPING  Indications: Pre-access. Comparison Study: no prior Performing Technologist: Abram Sander RVS  Examination Guidelines: A complete evaluation includes B-mode imaging, spectral Doppler, color Doppler, and power Doppler as needed of all accessible portions of each vessel. Bilateral testing is considered an integral part of a complete examination. Limited examinations for reoccurring indications may be performed as noted. +-----------------+-------------+----------+--------+  Right Cephalic    Diameter (cm) Depth (cm) Findings  +-----------------+-------------+----------+--------+  Shoulder              0.63         0.62              +-----------------+-------------+----------+--------+  Prox upper arm        0.59         0.51              +-----------------+-------------+----------+--------+  Mid upper arm         0.50         0.26              +-----------------+-------------+----------+--------+  Dist upper arm        0.47         0.28              +-----------------+-------------+----------+--------+  Antecubital fossa     0.64         0.38              +-----------------+-------------+----------+--------+  Prox forearm          0.50         0.35               +-----------------+-------------+----------+--------+  Mid forearm           0.37         0.26              +-----------------+-------------+----------+--------+  Dist forearm          0.32         0.19              +-----------------+-------------+----------+--------+ +-----------------+-------------+----------+--------+  Right Basilic     Diameter (cm) Depth (cm) Findings  +-----------------+-------------+----------+--------+  Prox upper arm        0.41         1.43              +-----------------+-------------+----------+--------+  Mid upper arm         0.51  1.24              +-----------------+-------------+----------+--------+  Dist upper arm        0.54         0.63              +-----------------+-------------+----------+--------+  Antecubital fossa     0.65         0.59              +-----------------+-------------+----------+--------+  Prox forearm          0.26         0.23              +-----------------+-------------+----------+--------+  Mid forearm           0.24         0.22              +-----------------+-------------+----------+--------+  Distal forearm        0.27         0.26              +-----------------+-------------+----------+--------+ +-----------------+-------------+----------+--------------+  Left Cephalic     Diameter (cm) Depth (cm)    Findings     +-----------------+-------------+----------+--------------+  Shoulder              0.31         0.39      branching     +-----------------+-------------+----------+--------------+  Prox upper arm        0.12         0.21      branching     +-----------------+-------------+----------+--------------+  Mid upper arm                              not visualized  +-----------------+-------------+----------+--------------+  Dist upper arm                             not visualized  +-----------------+-------------+----------+--------------+  Antecubital fossa                          not visualized   +-----------------+-------------+----------+--------------+  Prox forearm                               not visualized  +-----------------+-------------+----------+--------------+  Mid forearm                                not visualized  +-----------------+-------------+----------+--------------+  Dist forearm                               not visualized  +-----------------+-------------+----------+--------------+  Wrist                                      not visualized  +-----------------+-------------+----------+--------------+ +-----------------+-------------+----------+--------+  Left Basilic      Diameter (cm) Depth (cm) Findings  +-----------------+-------------+----------+--------+  Prox upper arm        0.91         1.10              +-----------------+-------------+----------+--------+  Mid upper arm  0.77         1.27              +-----------------+-------------+----------+--------+  Dist upper arm        0.64         0.50              +-----------------+-------------+----------+--------+  Antecubital fossa     0.76         0.59              +-----------------+-------------+----------+--------+  Prox forearm          0.17         0.18              +-----------------+-------------+----------+--------+  Mid forearm           0.20         0.20              +-----------------+-------------+----------+--------+  Distal forearm        0.21         0.19              +-----------------+-------------+----------+--------+ *See table(s) above for measurements and observations.  Diagnosing physician: Deitra Mayo MD Electronically signed by Deitra Mayo MD on 11/16/2018 at 2:52:19 PM.    Final     Time Spent in minutes  30   Lala Lund M.D on 11/17/2018 at 12:20 PM  To page go to [email or web address] - password [facility]

## 2018-11-17 NOTE — Progress Notes (Signed)
  [facility] Progress Note   Assessment/ Plan:   1.  Acute Hypoxic respiratory failure:  Requiring HFNC O2 support currently with CXR c/w pulmonary edema in setting of worsening edema.  Did not respond significantly to Lasix 160 IV +metolazone 5mg  now.  He needs dialysis now--> discussed at length with pt and dtr.  NPO placed, have ordered nontunneled HD cath with IR for now for expediency. HD orders placed.    2.  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  Renal US was normal in 03/2018 and without obstruction now.  HD ordered as above.  Will need perm access and CLIP, have ordered vein mapping  3 Anemia:  Suspect anemia of CKD.  Iron deficient- will dose ferrlicet with HD  4 HTN:  Markedly hypertensive at admission. Now better. Being managed acutely with NTG paste, IV hydralazine.  Resume home meds and follow with diuresis.   5 BMM:  Check phos, PTH.   6 DM:  Recent A1cs controlled; care per primary.   7. Elevated trop: 25--> 168--> 525 now, then flattened out.  Per primary   Subjective:    HD #1 yesterday, tolerated well.  HD #2 today.   Objective:   BP 124/62 (BP Location: Left Arm)   Pulse 72   Temp 97.7 F (36.5 C) (Oral)   Resp 12   Ht 5\' 6"  (1.676 m)   Wt 79 kg   SpO2 97%   BMI 28.11 kg/m   Physical Exam: Gen: older gentleman, NAD, sitting in bed NECK: + JVD to mandible CVS: RRR no m/r/g Resp: mildly increased WOB, crackles better Abd: some fullness Ext: 3+ LE edema  Labs: BMET Recent Labs  Lab 11/15/18 2304 11/16/18 1315 11/17/18 0340  NA 132* 132* 133*  K 5.1 4.8 4.9  CL 101 102 101  CO2 17* 15* 14*  GLUCOSE 178* 101* 97  BUN 110* 116* 116*  CREATININE 11.30* 11.84* 12.60*  CALCIUM 7.2* 7.0* 7.0*  PHOS  --  8.0*  --    CBC Recent Labs  Lab 11/15/18 2304  WBC 5.4  NEUTROABS 3.7  HGB 8.3*  HCT 24.2*  MCV 87.1  PLT 227     @IMGRELPRIORS @ Medications:    . amLODipine  10 mg Oral Daily  . aspirin EC  81 mg Oral Daily  . atorvastatin  40 mg Oral q1800  . Chlorhexidine Gluconate Cloth  6 each Topical Q0600  . heparin      . heparin injection (subcutaneous)  5,000 Units Subcutaneous Q8H  . hydrALAZINE  50 mg Oral Q8H  . insulin aspart  0-5 Units Subcutaneous QHS  . insulin aspart  0-9 Units Subcutaneous TID WC  . labetalol  100 mg Oral Daily  . sodium chloride flush  3 mL Intravenous Q12H     Madelon Lips, MD [facility] pgr [phone] 11/17/2018, 2:37 PM

## 2018-11-17 NOTE — Progress Notes (Signed)
Pt admitted to 5W28 from ED. Daughter at bedside. A&O x4, VS stable. Tele monitor placed & verified. Call bell within reach. All questions/concerns addressed. Will continue to monitor.

## 2018-11-17 NOTE — Progress Notes (Signed)
  Echocardiogram 2D Echocardiogram has been performed.  Max Sanchez 11/17/2018, 3:15 PM

## 2018-11-17 NOTE — ED Notes (Signed)
Spoke with Juliann Pulse in patient placement to inform them of pt going to dialysis and needs a bed.

## 2018-11-18 DIAGNOSIS — N185 Chronic kidney disease, stage 5: Secondary | ICD-10-CM

## 2018-11-18 DIAGNOSIS — D631 Anemia in chronic kidney disease: Secondary | ICD-10-CM

## 2018-11-18 LAB — BASIC METABOLIC PANEL
Anion gap: 13 (ref 5–15)
BUN: 40 mg/dL — ABNORMAL HIGH (ref 6–20)
CO2: 22 mmol/L (ref 22–32)
Calcium: 7.7 mg/dL — ABNORMAL LOW (ref 8.9–10.3)
Chloride: 98 mmol/L (ref 98–111)
Creatinine, Ser: 5.82 mg/dL — ABNORMAL HIGH (ref 0.61–1.24)
GFR calc Af Amer: 12 mL/min — ABNORMAL LOW (ref >60)
GFR calc non Af Amer: 10 mL/min — ABNORMAL LOW (ref >60)
Glucose, Bld: 113 mg/dL — ABNORMAL HIGH (ref 70–99)
Potassium: 3.5 mmol/L (ref 3.5–5.1)
Sodium: 133 mmol/L — ABNORMAL LOW (ref 135–145)

## 2018-11-18 LAB — GLUCOSE, CAPILLARY
Glucose-Capillary: 112 mg/dL — ABNORMAL HIGH (ref 70–99)
Glucose-Capillary: 235 mg/dL — ABNORMAL HIGH (ref 70–99)
Glucose-Capillary: 150 mg/dL — ABNORMAL HIGH (ref 70–99)
Glucose-Capillary: 95 mg/dL (ref 70–99)

## 2018-11-18 LAB — ABO/RH: ABO/RH(D): A POS

## 2018-11-18 MED ORDER — SODIUM CHLORIDE 0.9 % IV SOLN
125.0000 mg | INTRAVENOUS | Status: DC
  Administered 2018-11-20: 125 mg via INTRAVENOUS
  Filled 2018-11-18 (×3): qty 10

## 2018-11-18 NOTE — Progress Notes (Signed)
  [name] Progress Note   Assessment/ Plan:   1.  Acute Hypoxic respiratory failure:  Requiring HFNC O2 support currently with CXR c/w pulmonary edema in setting of worsening edema.   This has improved since starting dialysis.    2.  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  Renal US was normal in 03/2018 and without obstruction now.  HD ordered as above.  Will need perm access and CLIP, vein mapping completed, have c/s VVS, appreciate asistance  3. Anemia:  Suspect anemia of CKD.  Iron deficient- will dose ferrlicet with HD  4 HTN:  Markedly hypertensive at admission. Now better.  Resume home meds and follow with diuresis.   5 BMM:  Check phos, PTH.   6 DM:  Recent A1cs controlled; care per primary.   7. Elevated trop: 25--> 168--> 525 now, then flattened out.  Per primary   Subjective:    HD #3 today.  Discussed with pt the need for [facility] and AVF with the interpreter.  He is willing to proceed but is understandably distressed.  Notes breathing and edema better.     Objective:   BP 121/64 (BP Location: Left Arm)   Pulse 81   Temp 98.4 F (36.9 C) (Oral)   Resp 11   Ht 5\' 6"  (1.676 m)   Wt 78.9 kg   SpO2 97%   BMI 28.08 kg/m   Physical Exam: Gen: older gentleman, NAD, sitting in bed NECK:JVD improved CVS: RRR no m/r/g Resp: on RA now Abd: some fullness Ext: 1+ LE edema  Labs: BMET Recent Labs  Lab 11/15/18 2304 11/16/18 1315 11/17/18 0340 11/18/18 0239  NA 132* 132* 133* 133*  K 5.1 4.8 4.9 3.5  CL 101 102 101 98  CO2 17* 15* 14* 22  GLUCOSE 178* 101* 97 113*  BUN 110* 116* 116* 40*  CREATININE 11.30* 11.84* 12.60* 5.82*  CALCIUM 7.2* 7.0* 7.0* 7.7*  PHOS  --  8.0*  --   --    CBC Recent Labs  Lab 11/15/18 2304 11/17/18 2201 11/17/18 2320  WBC 5.4 5.5  --   NEUTROABS 3.7  --   --   HGB 8.3* 7.1* 7.3*  HCT 24.2* 20.9* 21.3*  MCV 87.1  86.4  --   PLT 227 190  --     @IMGRELPRIORS @ Medications:    . amLODipine  10 mg Oral Daily  . aspirin EC  81 mg Oral Daily  . atorvastatin  40 mg Oral q1800  . Chlorhexidine Gluconate Cloth  6 each Topical Q0600  . heparin injection (subcutaneous)  5,000 Units Subcutaneous Q8H  . hydrALAZINE  50 mg Oral Q8H  . insulin aspart  0-5 Units Subcutaneous QHS  . insulin aspart  0-9 Units Subcutaneous TID WC  . labetalol  100 mg Oral Daily  . sodium chloride flush  3 mL Intravenous Q12H     Max Lips, MD [facility] pgr [phone] 11/18/2018, 12:17 PM

## 2018-11-18 NOTE — Progress Notes (Signed)
Alphonsus Sias, RN called and made aware patient tx moved to 11/19/2018.

## 2018-11-18 NOTE — Progress Notes (Signed)
HD nurse called and informed staff that patient's will not have dialysis treatment tonight due to an emergency; he will have treatment tomorrow morning. Patient and his daughter were informed. Will continue to monitor.

## 2018-11-18 NOTE — Progress Notes (Signed)
PROGRESS NOTE                                                                                                                                                                                                             Patient Demographics:    Max Sanchez, is a 53 y.o. male, DOB - 1965-08-23, SA:[id number]  Admit date - 11/15/2018   Admitting Physician Ivor Costa, MD  Outpatient Primary MD for the patient is Scot Jun, Tina  LOS - 2  Chief Complaint  Patient presents with   Respiratory Distress   Shortness of Breath       Brief Narrative  Max Sanchez is a 53 y.o. male with medical history significant of HTN, HLD, DM, h/o CVA (03/2018), CKD-IV, anemia, dCHF,who presents with shortness of breath.  In the ER he was diagnosed with acute on chronic CHF due to fluid overload caused by renal failure.  Nephrology was consulted and we were requested to admit the patient.    Subjective:   Patient in bed, appears comfortable, denies any headache, no fever, no chest pain or pressure, no shortness of breath , no abdominal pain. No focal weakness.     Assessment  & Plan :     1.  Acute hypoxic respiratory failure due to fluid overload caused by ESRD intern causing acute on chronic diastolic CHF recent echo in January showed EF of 60%.  Seen by nephrology, did not respond to diuretics, dialysis started on 11/16/2018 with good effect shortness of breath has improved, noted with a preserved EF of 60% and possible chronic diastolic CHF.  Currently close to being compensated  2.  Hypertension.  Currently on combination of Norvasc, hydralazine and beta-blocker.  Will adjust as needed.  Currently blood pressure slightly on the lower side.  Will cut down beta-blocker into half.  3.  AKI on CKD 5 now progressed to ESRD.  Nephrology consulted, underwent right IJ dialysis catheter placement, dialysis was started on 11/16/2018 evening.  4.  Dyslipidemia.  Continue home dose  statin.  5.  History of stroke.  Continue aspirin and statin for secondary prevention.    6.  Troponin elevation.  In non-ACS pattern due to stress caused by acute hypoxic respiratory failure and CHF, in the setting of ESRD.  Trend is flat, chest pain-free, EKG nonacute. Continue aspirin, beta-blocker and statin.  Echo noted with preserved EF and no wall motion abnormality.  7.  Anemia of chronic disease.  Type screen done.  May transfuse with next dialysis if hemoglobin consistently close to   8. DM type II.  On sliding scale.  CBG (last 3)  Recent Labs    11/17/18 1628 11/17/18 2138 11/18/18 0813  GLUCAP 126* 142* 112*    Lab Results  Component Value Date   HGBA1C 6.0 (H) 11/16/2018     Family Communication  : Daughter bedside 9/15, 9/16  Code Status : Full  Disposition Plan  : Telemetry  Consults  : Nephrology  Procedures  :    R.IJ HD cath - 9/15  TTE -   1. The left ventricle has normal systolic function with an ejection fraction of 60-65%. The cavity size was mildly dilated. Left ventricular diastolic Doppler parameters are consistent with pseudonormalization. Elevated left ventricular end-diastolic pressure.  2. The right ventricle has normal systolic function. The cavity was normal. There is No increase in right ventricular wall thickness.  3. Left atrial size was severely dilated.  4. Right atrial size was normal.  5. Moderate pericardial effusion.  6. No evidence of mitral valve stenosis.  7. The aortic valve is tricuspid. Aortic valve regurgitation is mild by color flow Doppler. No stenosis of the aortic valve.  8. Pulmonic valve regurgitation is mild by color flow Doppler.  9. The aorta is normal unless otherwise noted. 10. The inferior vena cava was is dilated in size with >50% respiratory variability, suggesting right atrial pressure of 8 mmHg.  DVT Prophylaxis  :    Heparin added  Lab Results  Component Value Date   PLT 190 11/17/2018    Diet :    Diet Order            Diet renal with fluid restriction Fluid restriction: 1200 mL Fluid; Room service appropriate? Yes; Fluid consistency: Thin  Diet effective now               Inpatient Medications Scheduled Meds:  amLODipine  10 mg Oral Daily   aspirin EC  81 mg Oral Daily   atorvastatin  40 mg Oral q1800   Chlorhexidine Gluconate Cloth  6 each Topical Q0600   heparin injection (subcutaneous)  5,000 Units Subcutaneous Q8H   hydrALAZINE  50 mg Oral Q8H   insulin aspart  0-5 Units Subcutaneous QHS   insulin aspart  0-9 Units Subcutaneous TID WC   labetalol  100 mg Oral Daily   sodium chloride flush  3 mL Intravenous Q12H   Continuous Infusions:  sodium chloride     sodium chloride     sodium chloride     PRN Meds:.sodium chloride, sodium chloride, sodium chloride, acetaminophen, alteplase, heparin, hydrALAZINE, lidocaine (PF), lidocaine-prilocaine, morphine injection, nitroGLYCERIN, pentafluoroprop-tetrafluoroeth, promethazine, sodium chloride flush  Antibiotics  :   Anti-infectives (From admission, onward)   None          Objective:   Vitals:   11/18/18 0219 11/18/18 0406 11/18/18 0407 11/18/18 0759  BP: (!) 156/75 (!) 158/81  (!) 145/74  Pulse: 80 78  80  Resp: 13 12  14   Temp:   97.6 F (36.4 C) 98.3 F (36.8 C)  TempSrc:   Oral Oral  SpO2: 97% 98%  97%  Weight:      Height:        Wt Readings from Last 3 Encounters:  11/18/18 78.9 kg  04/22/18 80.3 kg  04/22/18 80.5 kg     Intake/Output Summary (Last 24 hours) at 11/18/2018 1002 Last data filed at 11/18/2018 0304 Gross per 24 hour  Intake 240 ml  Output 2400 ml  Net -2160 ml     Physical Exam  Awake Alert, Oriented X 3, No new F.N deficits, Normal affect [place].AT,PERRAL Supple Neck,No JVD, No cervical lymphadenopathy appriciated.  Symmetrical Chest wall movement, Good air movement bilaterally, CTAB RRR,No Gallops, Rubs or new Murmurs, No Parasternal Heave +ve B.Sounds, Abd  Soft, No tenderness, No organomegaly appriciated, No rebound - guarding or rigidity. No Cyanosis, Clubbing or edema, No new Rash or bruise    Data Review:    CBC Recent Labs  Lab 11/15/18 2304 11/17/18 2201 11/17/18 2320  WBC 5.4 5.5  --   HGB 8.3* 7.1* 7.3*  HCT 24.2* 20.9* 21.3*  PLT 227 190  --   MCV 87.1 86.4  --   MCH 29.9 29.3  --   MCHC 34.3 34.0  --   RDW 12.9 13.2  --   LYMPHSABS 1.0  --   --   MONOABS 0.5  --   --   EOSABS 0.2  --   --   BASOSABS 0.0  --   --     Chemistries  Recent Labs  Lab 11/15/18 2304 11/16/18 1315 11/17/18 0340 11/18/18 0239  NA 132* 132* 133* 133*  K 5.1 4.8 4.9 3.5  CL 101 102 101 98  CO2 17* 15* 14* 22  GLUCOSE 178* 101* 97 113*  BUN 110* 116* 116* 40*  CREATININE 11.30* 11.84* 12.60* 5.82*  CALCIUM 7.2* 7.0* 7.0* 7.7*  AST 16  --   --   --   ALT 21  --   --   --   ALKPHOS 72  --   --   --   BILITOT 0.6  --   --   --    ------------------------------------------------------------------------------------------------------------------ Recent Labs    11/16/18 0147  CHOL 260*  HDL 40*  LDLCALC 189*  TRIG 153*  CHOLHDL 6.5    Lab Results  Component Value Date   HGBA1C 6.0 (H) 11/16/2018   ------------------------------------------------------------------------------------------------------------------ No results for input(s): TSH, T4TOTAL, T3FREE, THYROIDAB in the last 72 hours.  Invalid input(s): FREET3 ------------------------------------------------------------------------------------------------------------------ Recent Labs    11/16/18 0155  FERRITIN 179  TIBC 277  IRON 24*    Coagulation profile Recent Labs  Lab 11/16/18 0155  INR 1.1    No results for input(s): DDIMER in the last 72 hours.  Cardiac Enzymes No results for input(s): CKMB, TROPONINI, MYOGLOBIN in the last 168 hours.  Invalid input(s):  CK ------------------------------------------------------------------------------------------------------------------    Component Value Date/Time   BNP 1,378.9 (H) 11/15/2018 2304    Micro Results Recent Results (from the past 240 hour(s))  SARS Coronavirus 2 [facility] order, Performed in [facility] hospital lab) Nasopharyngeal Nasopharyngeal Swab     Status: None   Collection Time: 11/15/18 11:15 PM   Specimen: Nasopharyngeal Swab  Result Value Ref Range Status   SARS Coronavirus 2 NEGATIVE NEGATIVE Final    Comment: (NOTE) If result is NEGATIVE SARS-CoV-2 target nucleic acids are NOT DETECTED. The SARS-CoV-2 RNA is generally detectable in upper and lower  respiratory specimens during the acute phase of infection. The lowest  concentration of SARS-CoV-2 viral copies this assay can detect is 250  copies / mL. A negative result does not preclude SARS-CoV-2 infection  and should not be used as the sole basis for treatment or other  patient management decisions.  A negative result may occur with  improper specimen collection / handling, submission of specimen other  than nasopharyngeal swab, presence  of viral mutation(s) within the  areas targeted by this assay, and inadequate number of viral copies  (<250 copies / mL). A negative result must be combined with clinical  observations, patient history, and epidemiological information. If result is POSITIVE SARS-CoV-2 target nucleic acids are DETECTED. The SARS-CoV-2 RNA is generally detectable in upper and lower  respiratory specimens dur ing the acute phase of infection.  Positive  results are indicative of active infection with SARS-CoV-2.  Clinical  correlation with patient history and other diagnostic information is  necessary to determine patient infection status.  Positive results do  not rule out bacterial infection or co-infection with other viruses. If result is PRESUMPTIVE POSTIVE SARS-CoV-2 nucleic acids MAY BE PRESENT.    A presumptive positive result was obtained on the submitted specimen  and confirmed on repeat testing.  While 2019 novel coronavirus  (SARS-CoV-2) nucleic acids may be present in the submitted sample  additional confirmatory testing may be necessary for epidemiological  and / or clinical management purposes  to differentiate between  SARS-CoV-2 and other Sarbecovirus currently known to infect humans.  If clinically indicated additional testing with an alternate test  methodology [phone]) is advised. The SARS-CoV-2 RNA is generally  detectable in upper and lower respiratory sp ecimens during the acute  phase of infection. The expected result is Negative. Fact Sheet for Patients:  StrictlyIdeas.no Fact Sheet for Healthcare Providers: BankingDealers.co.za This test is not yet approved or cleared by the [place] FDA and has been authorized for detection and/or diagnosis of SARS-CoV-2 by FDA under an Emergency Use Authorization (EUA).  This EUA will remain in effect (meaning this test can be used) for the duration of the COVID-19 declaration under Section 564(b)(1) of the Act, 21 U.S.C. section 360bbb-3(b)(1), unless the authorization is terminated or revoked sooner. Performed at [facility], [place] [address] [place], [place] [address]     Radiology Reports US Renal  Result Date: 11/16/2018 CLINICAL DATA:  AKI with elevated BUN and creatinine EXAM: RENAL / URINARY TRACT ULTRASOUND COMPLETE COMPARISON:  Ultrasound 03/10/2018 FINDINGS: Right Kidney: Renal measurements: 10.3 x 4.6 x 4.7 cm = volume: 114 mL . Echogenicity within normal limits. No mass or hydronephrosis visualized. Left Kidney: Renal measurements: 9.4 x 5.6 x 4.6 cm = volume: 125 mL. Echogenicity within normal limits. No mass or hydronephrosis visualized. Previously seen anechoic cyst in the interpolar region is no longer visualized. Bladder: Appears normal for degree of  bladder distention. Other: Bilateral pleural effusions. IMPRESSION: Incidentally noted bilateral pleural effusions. Previously seen left renal cyst no longer visualized. Otherwise unremarkable renal ultrasound. Electronically Signed   By: Lovena Le M.D.   On: 11/16/2018 02:21   Ir Fluoro Guide Cv Line Right  Result Date: 11/17/2018 CLINICAL DATA:  Acute on chronic kidney disease EXAM: EXAM RIGHT IJ CATHETER PLACEMENT UNDER ULTRASOUND AND FLUOROSCOPIC GUIDANCE TECHNIQUE: The procedure, risks (including but not limited to bleeding, infection, organ damage, pneumothorax), benefits, and alternatives were explained to the patient. Questions regarding the procedure were encouraged and answered. The patient understands and consents to the procedure. Patency of the right IJ vein was confirmed with ultrasound with image documentation. An appropriate skin site was determined. Skin site was marked. Region was prepped using maximum barrier technique including cap and mask, sterile gown, sterile gloves, large sterile sheet, and Chlorhexidine as cutaneous antisepsis. The region was infiltrated locally with 1% lidocaine. Under real-time ultrasound guidance, the right IJ vein was accessed with a 19 gauge needle; the needle  tip within the vein was confirmed with ultrasound image documentation. The needle exchanged over a guidewire for vascular dilator which allowed advancement of a 16 cm Trialysis catheter. This was positioned with the tip at the cavoatrial junction. Spot chest radiograph shows good positioning and no pneumothorax. Catheter was flushed and sutured externally with 0-Prolene sutures. Patient tolerated the procedure well. FLUOROSCOPY TIME:  26 seconds, 1 image was saved. COMPLICATIONS: COMPLICATIONS none IMPRESSION: Technically successful right IJ Trialysis catheter placement. The catheter is ready for immediate use as clinically indicated. Electronically Signed   By: Constance Holster M.D.   On: 11/17/2018  08:04   Ir US Guide Vasc Access Right  Result Date: 11/17/2018 CLINICAL DATA:  Acute on chronic kidney disease EXAM: EXAM RIGHT IJ CATHETER PLACEMENT UNDER ULTRASOUND AND FLUOROSCOPIC GUIDANCE TECHNIQUE: The procedure, risks (including but not limited to bleeding, infection, organ damage, pneumothorax), benefits, and alternatives were explained to the patient. Questions regarding the procedure were encouraged and answered. The patient understands and consents to the procedure. Patency of the right IJ vein was confirmed with ultrasound with image documentation. An appropriate skin site was determined. Skin site was marked. Region was prepped using maximum barrier technique including cap and mask, sterile gown, sterile gloves, large sterile sheet, and Chlorhexidine as cutaneous antisepsis. The region was infiltrated locally with 1% lidocaine. Under real-time ultrasound guidance, the right IJ vein was accessed with a 19 gauge needle; the needle tip within the vein was confirmed with ultrasound image documentation. The needle exchanged over a guidewire for vascular dilator which allowed advancement of a 16 cm Trialysis catheter. This was positioned with the tip at the cavoatrial junction. Spot chest radiograph shows good positioning and no pneumothorax. Catheter was flushed and sutured externally with 0-Prolene sutures. Patient tolerated the procedure well. FLUOROSCOPY TIME:  26 seconds, 1 image was saved. COMPLICATIONS: COMPLICATIONS none IMPRESSION: Technically successful right IJ Trialysis catheter placement. The catheter is ready for immediate use as clinically indicated. Electronically Signed   By: Constance Holster M.D.   On: 11/17/2018 08:04   Dg Chest Port 1 View  Result Date: 11/15/2018 CLINICAL DATA:  Shortness of breath EXAM: PORTABLE CHEST 1 VIEW COMPARISON:  03/10/2018 FINDINGS: Cardiac shadow remains mildly enlarged. Diffuse vascular congestion is noted with interstitial edema and bilateral  effusions. Bibasilar atelectasis is noted as well. IMPRESSION: Changes consistent with CHF with bibasilar atelectasis. Electronically Signed   By: Inez Catalina M.D.   On: 11/15/2018 23:20   Vas [place] Upper Ext Vein Mapping (pre-op Avf)  Result Date: 11/16/2018 UPPER EXTREMITY VEIN MAPPING  Indications: Pre-access. Comparison Study: no prior Performing Technologist: Abram Sander RVS  Examination Guidelines: A complete evaluation includes B-mode imaging, spectral Doppler, color Doppler, and power Doppler as needed of all accessible portions of each vessel. Bilateral testing is considered an integral part of a complete examination. Limited examinations for reoccurring indications may be performed as noted. +-----------------+-------------+----------+--------+  Right Cephalic    Diameter (cm) Depth (cm) Findings  +-----------------+-------------+----------+--------+  Shoulder              0.63         0.62              +-----------------+-------------+----------+--------+  Prox upper arm        0.59         0.51              +-----------------+-------------+----------+--------+  Mid upper arm         0.50  0.26              +-----------------+-------------+----------+--------+  Dist upper arm        0.47         0.28              +-----------------+-------------+----------+--------+  Antecubital fossa     0.64         0.38              +-----------------+-------------+----------+--------+  Prox forearm          0.50         0.35              +-----------------+-------------+----------+--------+  Mid forearm           0.37         0.26              +-----------------+-------------+----------+--------+  Dist forearm          0.32         0.19              +-----------------+-------------+----------+--------+ +-----------------+-------------+----------+--------+  Right Basilic     Diameter (cm) Depth (cm) Findings  +-----------------+-------------+----------+--------+  Prox upper arm        0.41         1.43               +-----------------+-------------+----------+--------+  Mid upper arm         0.51         1.24              +-----------------+-------------+----------+--------+  Dist upper arm        0.54         0.63              +-----------------+-------------+----------+--------+  Antecubital fossa     0.65         0.59              +-----------------+-------------+----------+--------+  Prox forearm          0.26         0.23              +-----------------+-------------+----------+--------+  Mid forearm           0.24         0.22              +-----------------+-------------+----------+--------+  Distal forearm        0.27         0.26              +-----------------+-------------+----------+--------+ +-----------------+-------------+----------+--------------+  Left Cephalic     Diameter (cm) Depth (cm)    Findings     +-----------------+-------------+----------+--------------+  Shoulder              0.31         0.39      branching     +-----------------+-------------+----------+--------------+  Prox upper arm        0.12         0.21      branching     +-----------------+-------------+----------+--------------+  Mid upper arm                              not visualized  +-----------------+-------------+----------+--------------+  Dist upper arm  not visualized  +-----------------+-------------+----------+--------------+  Antecubital fossa                          not visualized  +-----------------+-------------+----------+--------------+  Prox forearm                               not visualized  +-----------------+-------------+----------+--------------+  Mid forearm                                not visualized  +-----------------+-------------+----------+--------------+  Dist forearm                               not visualized  +-----------------+-------------+----------+--------------+  Wrist                                      not visualized   +-----------------+-------------+----------+--------------+ +-----------------+-------------+----------+--------+  Left Basilic      Diameter (cm) Depth (cm) Findings  +-----------------+-------------+----------+--------+  Prox upper arm        0.91         1.10              +-----------------+-------------+----------+--------+  Mid upper arm         0.77         1.27              +-----------------+-------------+----------+--------+  Dist upper arm        0.64         0.50              +-----------------+-------------+----------+--------+  Antecubital fossa     0.76         0.59              +-----------------+-------------+----------+--------+  Prox forearm          0.17         0.18              +-----------------+-------------+----------+--------+  Mid forearm           0.20         0.20              +-----------------+-------------+----------+--------+  Distal forearm        0.21         0.19              +-----------------+-------------+----------+--------+ *See table(s) above for measurements and observations.  Diagnosing physician: Deitra Mayo MD Electronically signed by Deitra Mayo MD on 11/16/2018 at 2:52:19 PM.    Final     Time Spent in minutes  30   Lala Lund M.D on 11/18/2018 at 10:02 AM  To page go to [email or web address] - password [email or web address]

## 2018-11-18 NOTE — Consult Note (Addendum)
Hospital Consult    Reason for Consult:  In need of HD access and [facility] Requesting Physician:  Hollie Salk MRN #:  [id number]  History of Present Illness: This is a 53 y.o. male with hx of HTN, DM, CVA in January 2020, and AKI on CKD now ESRD and is in need of [facility] and permanent HD access.  He is right hand dominant.   He does have a temp cath and his oxygenation has improved since starting diaylysis.   Talked with pt via interpretor.   The pt is not on a statin for cholesterol management.  The pt is not on a daily aspirin.   Other AC:   The pt is  on BB for hypertension.   The pt is diabetic.   Tobacco hx:  remote  Past Medical History:  Diagnosis Date  . CKD (chronic kidney disease), stage III ([place]) 03/10/2018  . Diabetic neuropathy ([place])   . History of CVA (cerebrovascular accident) 03/10/2018  . Hyperlipidemia 03/19/2018  . Hypertension 03/10/2018  . Hypertensive retinopathy of left eye   . 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 Type 2 diabetes mellitus with complication, without long-term current use of insulin [facility])     Past Surgical History:  Procedure Laterality Date  . EYE SURGERY Bilateral   . FOOT SURGERY    . IR FLUORO GUIDE CV LINE RIGHT  11/16/2018  . IR US GUIDE VASC ACCESS RIGHT  11/16/2018  . PR RPR COMPLEX RETINA DETACH VITRECT &MEMBRANE PEEL Left 10/01/2016  . PR RPR COMPLEX RETINA DETACH VITRECT &MEMBRANE PEEL Right 12/03/2016  . PR RPR COMPLEX RETINA DETACH VITRECT &MEMBRANE PEEL Right 04/01/2017  . PR XCAPSL CTRC RMVL INSJ IO LENS PROSTH W/O ECP Right 12/03/2016    No Known Allergies  Prior to Admission medications   Medication Sig Start Date End Date Taking? Authorizing Provider  labetalol (NORMODYNE) 200 MG tablet Take 1 tablet (200 mg total) by mouth 3 (three) times daily. Patient taking differently: Take 200 mg by mouth daily.  04/26/18  Yes Florencia Reasons, MD  atorvastatin (LIPITOR) 40 MG tablet Take 1  tablet (40 mg total) by mouth daily at 6 PM. Patient not taking: Reported on 11/16/2018 04/22/18   Scot Jun, FNP  gabapentin (NEURONTIN) 100 MG capsule Take 1 capsule (100 mg total) by mouth 3 (three) times daily. Patient not taking: Reported on 11/16/2018 04/22/18   Scot Jun, FNP  glipiZIDE (GLUCOTROL XL) 5 MG 24 hr tablet Take 1 tablet (5 mg total) by mouth daily with breakfast. Patient not taking: Reported on 11/16/2018 04/22/18   Scot Jun, FNP    Social History   Socioeconomic History  . Marital status: Married    Spouse name: Not on file  . Number of children: Not on file  . Years of education: Not on file  . Highest education level: Not on file  Occupational History  . Not on file  Social Needs  . Financial resource strain: Not on file  . Food insecurity    Worry: Not on file    Inability: Not on file  . Transportation needs    Medical: Not on file    Non-medical: Not on file  Tobacco Use  . Smoking status: Former Research scientist (life sciences)  . Smokeless tobacco: Never Used  . Tobacco comment: 10 pack year history; quit 2 years ago  Substance and Sexual Activity  . Alcohol use: Not Currently  Comment: stopped 2 years ago  . Drug use: Not Currently    Comment: tried cocaine and marijuana when younger  . Sexual activity: Not on file  Lifestyle  . Physical activity    Days per week: Not on file    Minutes per session: Not on file  . Stress: Not on file  Relationships  . Social Herbalist on phone: Not on file    Gets together: Not on file    Attends religious service: Not on file    Active member of club or organization: Not on file    Attends meetings of clubs or organizations: Not on file    Relationship status: Not on file  . Intimate partner violence    Fear of current or ex partner: Not on file    Emotionally abused: Not on file    Physically abused: Not on file    Forced sexual activity: Not on file  Other Topics Concern  . Not on file   Social History Narrative  . Not on file     Family History  Problem Relation Age of Onset  . Diabetes Father   . Diabetes Brother   . Kidney disease Brother     ROS: [x]  Positive   [ ]  Negative   [ ]  All sytems reviewed and are negative  Cardiac: []  chest pain/pressure []  palpitations [x]  SOB lying flat [x]  DOE  Vascular: [x]  swelling in legs  Pulmonary: []  productive cough []  asthma/wheezing []  home O2  Neurologic: [x]  hx of CVA []  mini stroke  Hematologic: [x]  anemia  Endocrine:   [x]  diabetes   GI []  vomiting blood []  blood in stool  GU: [x]  CKD/renal failure [x]  HD--[]  M/W/F or []  T/T/S []  burning with urination []  blood in urine  Psychiatric: []  anxiety []  depression  Musculoskeletal: []  arthritis []  joint pain  Integumentary: []  rashes []  ulcers  Constitutional: []  fever []  chills   Physical Examination  Vitals:   11/18/18 0759 11/18/18 1147  BP: (!) 145/74 121/64  Pulse: 80 81  Resp: 14 11  Temp: 98.3 F (36.8 C) 98.4 F (36.9 C)  SpO2: 97% 97%   Body mass index is 28.08 kg/m.  General:  WDWN in NAD Gait: Not observed HENT: WNL, normocephalic Pulmonary: normal non-labored breathing Cardiac: regular Abdomen:  soft, NT/ND, no masses Skin: without rashes Vascular Exam/Pulses:  Right Left  Radial 2+ (normal) 2+ (normal)  DP 2+ (normal) 2+ (normal)   Extremities: without ischemic changes, without Gangrene , without cellulitis; without open wounds; there is an IV in each forearm Musculoskeletal: no muscle wasting or atrophy  Neurologic: A&O X 3;  No focal weakness or paresthesias are detected; speech is fluent/normal Psychiatric:  The pt has flat affect.   CBC    Component Value Date/Time   WBC 5.5 11/17/2018 2201   RBC 2.42 (L) 11/17/2018 2201   HGB 7.3 (L) 11/17/2018 2320   HCT 21.3 (L) 11/17/2018 2320   PLT 190 11/17/2018 2201   MCV 86.4 11/17/2018 2201   MCV 82.2 08/29/2015 1652   MCH 29.3 11/17/2018 2201    MCHC 34.0 11/17/2018 2201   RDW 13.2 11/17/2018 2201   LYMPHSABS 1.0 11/15/2018 2304   MONOABS 0.5 11/15/2018 2304   EOSABS 0.2 11/15/2018 2304   BASOSABS 0.0 11/15/2018 2304    BMET    Component Value Date/Time   NA 133 (L) 11/18/2018 0239   NA 135 04/22/2018 1130   K 3.5 11/18/2018  0239   CL 98 11/18/2018 0239   CO2 22 11/18/2018 0239   GLUCOSE 113 (H) 11/18/2018 0239   BUN 40 (H) 11/18/2018 0239   BUN 33 (H) 04/22/2018 1130   CREATININE 5.82 (H) 11/18/2018 0239   CREATININE 1.27 08/29/2015 1633   CALCIUM 7.7 (L) 11/18/2018 0239   GFRNONAA 10 (L) 11/18/2018 0239   GFRNONAA 66 08/29/2015 1633   GFRAA 12 (L) 11/18/2018 0239   GFRAA 76 08/29/2015 1633    COAGS: Lab Results  Component Value Date   INR 1.1 11/16/2018   INR 0.96 04/22/2018   INR 1.02 03/10/2018     Non-Invasive Vascular Imaging:   BUE vein mappign 11/16/2018: +-----------------+-------------+----------+--------+ Right Cephalic   Diameter (cm)Depth (cm)Findings +-----------------+-------------+----------+--------+ Shoulder             0.63        0.62            +-----------------+-------------+----------+--------+ Prox upper arm       0.59        0.51            +-----------------+-------------+----------+--------+ Mid upper arm        0.50        0.26            +-----------------+-------------+----------+--------+ Dist upper arm       0.47        0.28            +-----------------+-------------+----------+--------+ Antecubital fossa    0.64        0.38            +-----------------+-------------+----------+--------+ Prox forearm         0.50        0.35            +-----------------+-------------+----------+--------+ Mid forearm          0.37        0.26            +-----------------+-------------+----------+--------+ Dist forearm         0.32        0.19            +-----------------+-------------+----------+--------+   +-----------------+-------------+----------+--------+ Right Basilic    Diameter (cm)Depth (cm)Findings +-----------------+-------------+----------+--------+ Prox upper arm       0.41        1.43            +-----------------+-------------+----------+--------+ Mid upper arm        0.51        1.24            +-----------------+-------------+----------+--------+ Dist upper arm       0.54        0.63            +-----------------+-------------+----------+--------+ Antecubital fossa    0.65        0.59            +-----------------+-------------+----------+--------+ Prox forearm         0.26        0.23            +-----------------+-------------+----------+--------+ Mid forearm          0.24        0.22            +-----------------+-------------+----------+--------+ Distal forearm       0.27        0.26            +-----------------+-------------+----------+--------+  +-----------------+-------------+----------+--------------+ Left Cephalic  Diameter (cm)Depth (cm)   Findings    +-----------------+-------------+----------+--------------+ Shoulder             0.31        0.39     branching    +-----------------+-------------+----------+--------------+ Prox upper arm       0.12        0.21     branching    +-----------------+-------------+----------+--------------+ Mid upper arm                           not visualized +-----------------+-------------+----------+--------------+ Dist upper arm                          not visualized +-----------------+-------------+----------+--------------+ Antecubital fossa                       not visualized +-----------------+-------------+----------+--------------+ Prox forearm                            not visualized +-----------------+-------------+----------+--------------+ Mid forearm                             not visualized  +-----------------+-------------+----------+--------------+ Dist forearm                            not visualized +-----------------+-------------+----------+--------------+ Wrist                                   not visualized +-----------------+-------------+----------+--------------+  +-----------------+-------------+----------+--------+ Left Basilic     Diameter (cm)Depth (cm)Findings +-----------------+-------------+----------+--------+ Prox upper arm       0.91        1.10            +-----------------+-------------+----------+--------+ Mid upper arm        0.77        1.27            +-----------------+-------------+----------+--------+ Dist upper arm       0.64        0.50            +-----------------+-------------+----------+--------+ Antecubital fossa    0.76        0.59            +-----------------+-------------+----------+--------+ Prox forearm         0.17        0.18            +-----------------+-------------+----------+--------+ Mid forearm          0.20        0.20            +-----------------+-------------+----------+--------+ Distal forearm       0.21        0.19            +-----------------+-------------+----------+--------+    ASSESSMENT/PLAN: This is a 53 y.o. male with AKI on CKD no on dialysis and in need of access.  -pt is right hand dominant.  On VM, pt appears to have a basilic that is satisfactory.  The cephalic is not visualized, but will look with u/s in OR as well.  He will also need TDC.   -discussed possibility of steal sx with access.  -Dr. Donzetta Matters to see this afternoon.   Leontine Locket, PA-C [facility] [phone]  I have independently interviewed and examined patient and agree with PA assessment and plan above. Restrict left upper extremity and plan for left upper extremity AV graft or fistula on Monday as well as TDC.  Currently dialyzing via temporary catheter.   N.p.o. past midnight Sunday.    C. Donzetta Matters, MD [facility] [place] Office: [phone] Pager: [phone]

## 2018-11-19 DIAGNOSIS — R7989 Other specified abnormal findings of blood chemistry: Secondary | ICD-10-CM

## 2018-11-19 LAB — BASIC METABOLIC PANEL
Anion gap: 12 (ref 5–15)
BUN: 50 mg/dL — ABNORMAL HIGH (ref 6–20)
CO2: 23 mmol/L (ref 22–32)
Calcium: 7.6 mg/dL — ABNORMAL LOW (ref 8.9–10.3)
Chloride: 96 mmol/L — ABNORMAL LOW (ref 98–111)
Creatinine, Ser: 7.52 mg/dL — ABNORMAL HIGH (ref 0.61–1.24)
GFR calc Af Amer: 9 mL/min — ABNORMAL LOW (ref >60)
GFR calc non Af Amer: 8 mL/min — ABNORMAL LOW (ref >60)
Glucose, Bld: 152 mg/dL — ABNORMAL HIGH (ref 70–99)
Potassium: 4 mmol/L (ref 3.5–5.1)
Sodium: 131 mmol/L — ABNORMAL LOW (ref 135–145)

## 2018-11-19 LAB — GLUCOSE, CAPILLARY
Glucose-Capillary: 119 mg/dL — ABNORMAL HIGH (ref 70–99)
Glucose-Capillary: 164 mg/dL — ABNORMAL HIGH (ref 70–99)
Glucose-Capillary: 175 mg/dL — ABNORMAL HIGH (ref 70–99)
Glucose-Capillary: 108 mg/dL — ABNORMAL HIGH (ref 70–99)

## 2018-11-19 MED ORDER — HEPARIN SODIUM (PORCINE) 1000 UNIT/ML IJ SOLN
INTRAMUSCULAR | Status: AC
  Filled 2018-11-19: qty 3

## 2018-11-19 MED ORDER — SEVELAMER CARBONATE 800 MG PO TABS
800.0000 mg | ORAL_TABLET | Freq: Three times a day (TID) | ORAL | Status: DC
  Administered 2018-11-19 – 2018-11-25 (×14): 800 mg via ORAL
  Filled 2018-11-19 (×14): qty 1

## 2018-11-19 MED ORDER — HEPARIN SODIUM (PORCINE) 1000 UNIT/ML IJ SOLN
2.6000 mL | Freq: Once | INTRAMUSCULAR | Status: AC
  Administered 2018-11-19: 2600 [IU] via INTRAVENOUS

## 2018-11-19 NOTE — Procedures (Signed)
Patient seen and examined on Hemodialysis. BP (!) 175/88   Pulse 76   Temp 99 F (37.2 C) (Oral)   Resp 15   Ht 5\' 6"  (1.676 m)   Wt 78.8 kg   SpO2 95%   BMI 28.04 kg/m   QB 400 mL/ min, UF goal 2L  HD #3.  Dialyzing via nontunneled HD cath.  Doing well  Tolerating treatment without complaints at this time.   Madelon Lips MD [facility] pgr [phone] 9:16 AM

## 2018-11-19 NOTE — Progress Notes (Signed)
   Patient dialyzing this morning via temporary catheter.  We will plan for left upper extremity AV graft or fistula Monday as well as conversion to tunneled catheter.  N.p.o. past midnight Sunday.  Dayelin Balducci C. Donzetta Matters, MD [facility] [place] Office: [phone] Pager: [phone]

## 2018-11-19 NOTE — Progress Notes (Signed)
Renal Navigator met with patient in his room with assistance from Spanish Interpreter/Graciella to complete assessment in order to make referral for OP HD treatment. Patient pleasant and agreeable. He reports he lives with his wife and daughter/Kenia, whom he asked Renal Navigator to call to discuss OP HD. He states none of the family is currently working due to the pandemic. He reports understanding of need to attend treatments 3 times per week, but again wanted to make sure his daughter is aware.  Renal Navigator spoke with daughter, who confirms that she will provide transportation for her father and stated that other than school in the evenings, she is not working around any schedule that would prevent her from taking her father for appointments at any time. OP HD referral made to [facility], requesting the [place] clinic, as this is the closest clinic to patient's home.  Renal Navigator will follow closely and update patient and daughter regarding acceptance and seat schedule Patient is currently uninsured, which could delay acceptance process.  Alphonzo Cruise, [place] Renal Navigator [phone]

## 2018-11-19 NOTE — Progress Notes (Signed)
PROGRESS NOTE                                                                                                                                                                                                             Patient Demographics:    Max Sanchez, is a 53 y.o. male, DOB - [birth year], KH:[id number]  Admit date - 11/15/2018   Admitting Physician Ivor Costa, MD  Outpatient Primary MD for the patient is Scot Jun, [place]  LOS - 3  Chief Complaint  Patient presents with   Respiratory Distress   Shortness of Breath       Brief Narrative  Max Sanchez is a 53 y.o. male with medical history significant of HTN, HLD, DM, h/o CVA (03/2018), CKD-IV, anemia, dCHF,who presents with shortness of breath.  In the ER he was diagnosed with acute on chronic CHF due to fluid overload caused by renal failure.  Nephrology was consulted and we were requested to admit the patient.    Subjective:   Patient in bed, appears comfortable, denies any headache, no fever, no chest pain or pressure, no shortness of breath , no abdominal pain. No focal weakness.   Assessment  & Plan :     1.  Acute hypoxic respiratory failure due to fluid overload caused by ESRD intern causing acute on chronic diastolic CHF recent echo in January showed EF of 60%.  Seen by nephrology, did not respond to diuretics, dialysis started on 11/16/2018 with good effect shortness of breath has improved, noted with a preserved EF of 60% and possible chronic diastolic CHF.  Currently close to being compensated  2.  Hypertension.  Currently on combination of Norvasc, hydralazine and beta-blocker.  Will adjust as needed.  Currently blood pressure slightly on the lower side.  Will cut down beta-blocker into half.  3.  AKI on CKD 5 now progressed to ESRD.  Nephrology consulted, underwent right IJ dialysis catheter placement, dialysis was started on 11/16/2018 evening.  Discussed plan of care with nephrologist Dr.  Hollie Salk on 11/19/2018, he is likely to get an outpatient dialysis spot coming Tuesday.  Also plan for fistula placement on coming Monday.  4.  Dyslipidemia.  Continue home dose statin.  5.  History of stroke.  Continue aspirin and statin for secondary prevention.    6.  Troponin elevation.  In non-ACS pattern due to stress caused by acute hypoxic respiratory failure and CHF, in the setting of ESRD.  Trend is flat, chest pain-free, EKG  nonacute. Continue aspirin, beta-blocker and statin.  Echo noted with preserved EF and no wall motion abnormality.  7.  Anemia of chronic disease.  Type screen done.  May transfuse with next dialysis if hemoglobin consistently close to   8. DM type II.  On sliding scale.  CBG (last 3)  Recent Labs    11/18/18 2139 11/19/18 0628 11/19/18 1141  GLUCAP 95 119* 164*    Lab Results  Component Value Date   HGBA1C 6.0 (H) 11/16/2018     Family Communication  : Daughter bedside 9/15, 9/16  Code Status : Full  Disposition Plan  : Telemetry  Consults  : Nephrology, vascular surgery  Procedures  :    R.IJ HD cath - 9/15  TTE -   1. The left ventricle has normal systolic function with an ejection fraction of 60-65%. The cavity size was mildly dilated. Left ventricular diastolic Doppler parameters are consistent with pseudonormalization. Elevated left ventricular end-diastolic pressure.  2. The right ventricle has normal systolic function. The cavity was normal. There is No increase in right ventricular wall thickness.  3. Left atrial size was severely dilated.  4. Right atrial size was normal.  5. Moderate pericardial effusion.  6. No evidence of mitral valve stenosis.  7. The aortic valve is tricuspid. Aortic valve regurgitation is mild by color flow Doppler. No stenosis of the aortic valve.  8. Pulmonic valve regurgitation is mild by color flow Doppler.  9. The aorta is normal unless otherwise noted. 10. The inferior vena cava was is dilated in size  with >50% respiratory variability, suggesting right atrial pressure of 8 mmHg.  DVT Prophylaxis  :    Heparin added  Lab Results  Component Value Date   PLT 190 11/17/2018    Diet :  Diet Order            Diet renal with fluid restriction Fluid restriction: 1200 mL Fluid; Room service appropriate? Yes; Fluid consistency: Thin  Diet effective now               Inpatient Medications Scheduled Meds:  amLODipine  10 mg Oral Daily   aspirin EC  81 mg Oral Daily   atorvastatin  40 mg Oral q1800   Chlorhexidine Gluconate Cloth  6 each Topical Q0600   heparin injection (subcutaneous)  5,000 Units Subcutaneous Q8H   hydrALAZINE  50 mg Oral Q8H   insulin aspart  0-5 Units Subcutaneous QHS   insulin aspart  0-9 Units Subcutaneous TID WC   labetalol  100 mg Oral Daily   sevelamer carbonate  800 mg Oral TID WC   sodium chloride flush  3 mL Intravenous Q12H   Continuous Infusions:  sodium chloride     sodium chloride     sodium chloride     [START ON 11/20/2018] ferric gluconate (FERRLECIT/NULECIT) IV     PRN Meds:.sodium chloride, sodium chloride, sodium chloride, acetaminophen, alteplase, heparin, hydrALAZINE, lidocaine (PF), lidocaine-prilocaine, morphine injection, nitroGLYCERIN, pentafluoroprop-tetrafluoroeth, promethazine, sodium chloride flush  Antibiotics  :   Anti-infectives (From admission, onward)   None          Objective:   Vitals:   11/19/18 0930 11/19/18 1000 11/19/18 1039 11/19/18 1132  BP: (!) 165/80 (!) 170/82 (!) 163/85 (!) 165/92  Pulse: 74 82 79 80  Resp: 17 16 15 12   Temp:   98.9 F (37.2 C) 97.7 F (36.5 C)  TempSrc:   Oral Oral  SpO2:   95% 96%  Weight:  76.8 kg   Height:        Wt Readings from Last 3 Encounters:  11/19/18 76.8 kg  04/22/18 80.3 kg  04/22/18 80.5 kg     Intake/Output Summary (Last 24 hours) at 11/19/2018 1201 Last data filed at 11/19/2018 1039 Gross per 24 hour  Intake 300 ml  Output 2400 ml  Net  -2100 ml     Physical Exam  Awake Alert, Oriented X 3, No new F.N deficits, Normal affect [place].AT,PERRAL Supple Neck,No JVD, No cervical lymphadenopathy appriciated.  Symmetrical Chest wall movement, Good air movement bilaterally, CTAB RRR,No Gallops, Rubs or new Murmurs, No Parasternal Heave +ve B.Sounds, Abd Soft, No tenderness, No organomegaly appriciated, No rebound - guarding or rigidity. No Cyanosis, Clubbing or edema, No new Rash or bruise    Data Review:    CBC Recent Labs  Lab 11/15/18 2304 11/17/18 2201 11/17/18 2320  WBC 5.4 5.5  --   HGB 8.3* 7.1* 7.3*  HCT 24.2* 20.9* 21.3*  PLT 227 190  --   MCV 87.1 86.4  --   MCH 29.9 29.3  --   MCHC 34.3 34.0  --   RDW 12.9 13.2  --   LYMPHSABS 1.0  --   --   MONOABS 0.5  --   --   EOSABS 0.2  --   --   BASOSABS 0.0  --   --     Chemistries  Recent Labs  Lab 11/15/18 2304 11/16/18 1315 11/17/18 0340 11/18/18 0239 11/19/18 0234  NA 132* 132* 133* 133* 131*  K 5.1 4.8 4.9 3.5 4.0  CL 101 102 101 98 96*  CO2 17* 15* 14* 22 23  GLUCOSE 178* 101* 97 113* 152*  BUN 110* 116* 116* 40* 50*  CREATININE 11.30* 11.84* 12.60* 5.82* 7.52*  CALCIUM 7.2* 7.0* 7.0* 7.7* 7.6*  AST 16  --   --   --   --   ALT 21  --   --   --   --   ALKPHOS 72  --   --   --   --   BILITOT 0.6  --   --   --   --    ------------------------------------------------------------------------------------------------------------------ No results for input(s): CHOL, HDL, LDLCALC, TRIG, CHOLHDL, LDLDIRECT in the last 72 hours.  Lab Results  Component Value Date   HGBA1C 6.0 (H) 11/16/2018   ------------------------------------------------------------------------------------------------------------------ No results for input(s): TSH, T4TOTAL, T3FREE, THYROIDAB in the last 72 hours.  Invalid input(s): FREET3 ------------------------------------------------------------------------------------------------------------------ No results for  input(s): VITAMINB12, FOLATE, FERRITIN, TIBC, IRON, RETICCTPCT in the last 72 hours.  Coagulation profile Recent Labs  Lab 11/16/18 0155  INR 1.1    No results for input(s): DDIMER in the last 72 hours.  Cardiac Enzymes No results for input(s): CKMB, TROPONINI, MYOGLOBIN in the last 168 hours.  Invalid input(s): CK ------------------------------------------------------------------------------------------------------------------    Component Value Date/Time   BNP 1,378.9 (H) 11/15/2018 2304    Micro Results Recent Results (from the past 240 hour(s))  SARS Coronavirus 2 [facility] order, Performed in [facility] hospital lab) Nasopharyngeal Nasopharyngeal Swab     Status: None   Collection Time: 11/15/18 11:15 PM   Specimen: Nasopharyngeal Swab  Result Value Ref Range Status   SARS Coronavirus 2 NEGATIVE NEGATIVE Final    Comment: (NOTE) If result is NEGATIVE SARS-CoV-2 target nucleic acids are NOT DETECTED. The SARS-CoV-2 RNA is generally detectable in upper and lower  respiratory specimens during the acute phase of infection. The lowest  concentration of SARS-CoV-2 viral copies this assay can detect is 250  copies / mL. A negative result does not preclude SARS-CoV-2 infection  and should not be used as the sole basis for treatment or other  patient management decisions.  A negative result may occur with  improper specimen collection / handling, submission of specimen other  than nasopharyngeal swab, presence of viral mutation(s) within the  areas targeted by this assay, and inadequate number of viral copies  (<250 copies / mL). A negative result must be combined with clinical  observations, patient history, and epidemiological information. If result is POSITIVE SARS-CoV-2 target nucleic acids are DETECTED. The SARS-CoV-2 RNA is generally detectable in upper and lower  respiratory specimens dur ing the acute phase of infection.  Positive  results are indicative of active  infection with SARS-CoV-2.  Clinical  correlation with patient history and other diagnostic information is  necessary to determine patient infection status.  Positive results do  not rule out bacterial infection or co-infection with other viruses. If result is PRESUMPTIVE POSTIVE SARS-CoV-2 nucleic acids MAY BE PRESENT.   A presumptive positive result was obtained on the submitted specimen  and confirmed on repeat testing.  While 2019 novel coronavirus  (SARS-CoV-2) nucleic acids may be present in the submitted sample  additional confirmatory testing may be necessary for epidemiological  and / or clinical management purposes  to differentiate between  SARS-CoV-2 and other Sarbecovirus currently known to infect humans.  If clinically indicated additional testing with an alternate test  methodology [phone]) is advised. The SARS-CoV-2 RNA is generally  detectable in upper and lower respiratory sp ecimens during the acute  phase of infection. The expected result is Negative. Fact Sheet for Patients:  StrictlyIdeas.no Fact Sheet for Healthcare Providers: BankingDealers.co.za This test is not yet approved or cleared by the [place] FDA and has been authorized for detection and/or diagnosis of SARS-CoV-2 by FDA under an Emergency Use Authorization (EUA).  This EUA will remain in effect (meaning this test can be used) for the duration of the COVID-19 declaration under Section 564(b)(1) of the Act, 21 U.S.C. section 360bbb-3(b)(1), unless the authorization is terminated or revoked sooner. Performed at [facility], [place] [address], [place], [place] [address]     Radiology Reports US Renal  Result Date: 11/16/2018 CLINICAL DATA:  AKI with elevated BUN and creatinine EXAM: RENAL / URINARY TRACT ULTRASOUND COMPLETE COMPARISON:  Ultrasound 03/10/2018 FINDINGS: Right Kidney: Renal measurements: 10.3 x 4.6 x 4.7 cm = volume: 114 mL .  Echogenicity within normal limits. No mass or hydronephrosis visualized. Left Kidney: Renal measurements: 9.4 x 5.6 x 4.6 cm = volume: 125 mL. Echogenicity within normal limits. No mass or hydronephrosis visualized. Previously seen anechoic cyst in the interpolar region is no longer visualized. Bladder: Appears normal for degree of bladder distention. Other: Bilateral pleural effusions. IMPRESSION: Incidentally noted bilateral pleural effusions. Previously seen left renal cyst no longer visualized. Otherwise unremarkable renal ultrasound. Electronically Signed   By: Lovena Le M.D.   On: 11/16/2018 02:21   Ir Fluoro Guide Cv Line Right  Result Date: 11/17/2018 CLINICAL DATA:  Acute on chronic kidney disease EXAM: EXAM RIGHT IJ CATHETER PLACEMENT UNDER ULTRASOUND AND FLUOROSCOPIC GUIDANCE TECHNIQUE: The procedure, risks (including but not limited to bleeding, infection, organ damage, pneumothorax), benefits, and alternatives were explained to the patient. Questions regarding the procedure were encouraged and answered. The patient understands and consents to the procedure. Patency of the right IJ vein was confirmed  with ultrasound with image documentation. An appropriate skin site was determined. Skin site was marked. Region was prepped using maximum barrier technique including cap and mask, sterile gown, sterile gloves, large sterile sheet, and Chlorhexidine as cutaneous antisepsis. The region was infiltrated locally with 1% lidocaine. Under real-time ultrasound guidance, the right IJ vein was accessed with a 19 gauge needle; the needle tip within the vein was confirmed with ultrasound image documentation. The needle exchanged over a guidewire for vascular dilator which allowed advancement of a 16 cm Trialysis catheter. This was positioned with the tip at the cavoatrial junction. Spot chest radiograph shows good positioning and no pneumothorax. Catheter was flushed and sutured externally with 0-Prolene sutures.  Patient tolerated the procedure well. FLUOROSCOPY TIME:  26 seconds, 1 image was saved. COMPLICATIONS: COMPLICATIONS none IMPRESSION: Technically successful right IJ Trialysis catheter placement. The catheter is ready for immediate use as clinically indicated. Electronically Signed   By: Constance Holster M.D.   On: 11/17/2018 08:04   Ir US Guide Vasc Access Right  Result Date: 11/17/2018 CLINICAL DATA:  Acute on chronic kidney disease EXAM: EXAM RIGHT IJ CATHETER PLACEMENT UNDER ULTRASOUND AND FLUOROSCOPIC GUIDANCE TECHNIQUE: The procedure, risks (including but not limited to bleeding, infection, organ damage, pneumothorax), benefits, and alternatives were explained to the patient. Questions regarding the procedure were encouraged and answered. The patient understands and consents to the procedure. Patency of the right IJ vein was confirmed with ultrasound with image documentation. An appropriate skin site was determined. Skin site was marked. Region was prepped using maximum barrier technique including cap and mask, sterile gown, sterile gloves, large sterile sheet, and Chlorhexidine as cutaneous antisepsis. The region was infiltrated locally with 1% lidocaine. Under real-time ultrasound guidance, the right IJ vein was accessed with a 19 gauge needle; the needle tip within the vein was confirmed with ultrasound image documentation. The needle exchanged over a guidewire for vascular dilator which allowed advancement of a 16 cm Trialysis catheter. This was positioned with the tip at the cavoatrial junction. Spot chest radiograph shows good positioning and no pneumothorax. Catheter was flushed and sutured externally with 0-Prolene sutures. Patient tolerated the procedure well. FLUOROSCOPY TIME:  26 seconds, 1 image was saved. COMPLICATIONS: COMPLICATIONS none IMPRESSION: Technically successful right IJ Trialysis catheter placement. The catheter is ready for immediate use as clinically indicated. Electronically  Signed   By: Constance Holster M.D.   On: 11/17/2018 08:04   Dg Chest Port 1 View  Result Date: 11/15/2018 CLINICAL DATA:  Shortness of breath EXAM: PORTABLE CHEST 1 VIEW COMPARISON:  03/10/2018 FINDINGS: Cardiac shadow remains mildly enlarged. Diffuse vascular congestion is noted with interstitial edema and bilateral effusions. Bibasilar atelectasis is noted as well. IMPRESSION: Changes consistent with CHF with bibasilar atelectasis. Electronically Signed   By: Inez Catalina M.D.   On: 11/15/2018 23:20   Vas [place] Upper Ext Vein Mapping (pre-op Avf)  Result Date: 11/16/2018 UPPER EXTREMITY VEIN MAPPING  Indications: Pre-access. Comparison Study: no prior Performing Technologist: Abram Sander RVS  Examination Guidelines: A complete evaluation includes B-mode imaging, spectral Doppler, color Doppler, and power Doppler as needed of all accessible portions of each vessel. Bilateral testing is considered an integral part of a complete examination. Limited examinations for reoccurring indications may be performed as noted. +-----------------+-------------+----------+--------+  Right Cephalic    Diameter (cm) Depth (cm) Findings  +-----------------+-------------+----------+--------+  Shoulder              0.63         0.62              +-----------------+-------------+----------+--------+  Prox upper arm        0.59         0.51              +-----------------+-------------+----------+--------+  Mid upper arm         0.50         0.26              +-----------------+-------------+----------+--------+  Dist upper arm        0.47         0.28              +-----------------+-------------+----------+--------+  Antecubital fossa     0.64         0.38              +-----------------+-------------+----------+--------+  Prox forearm          0.50         0.35              +-----------------+-------------+----------+--------+  Mid forearm           0.37         0.26               +-----------------+-------------+----------+--------+  Dist forearm          0.32         0.19              +-----------------+-------------+----------+--------+ +-----------------+-------------+----------+--------+  Right Basilic     Diameter (cm) Depth (cm) Findings  +-----------------+-------------+----------+--------+  Prox upper arm        0.41         1.43              +-----------------+-------------+----------+--------+  Mid upper arm         0.51         1.24              +-----------------+-------------+----------+--------+  Dist upper arm        0.54         0.63              +-----------------+-------------+----------+--------+  Antecubital fossa     0.65         0.59              +-----------------+-------------+----------+--------+  Prox forearm          0.26         0.23              +-----------------+-------------+----------+--------+  Mid forearm           0.24         0.22              +-----------------+-------------+----------+--------+  Distal forearm        0.27         0.26              +-----------------+-------------+----------+--------+ +-----------------+-------------+----------+--------------+  Left Cephalic     Diameter (cm) Depth (cm)    Findings     +-----------------+-------------+----------+--------------+  Shoulder              0.31         0.39      branching     +-----------------+-------------+----------+--------------+  Prox upper arm        0.12         0.21      branching     +-----------------+-------------+----------+--------------+  Mid upper arm  not visualized  +-----------------+-------------+----------+--------------+  Dist upper arm                             not visualized  +-----------------+-------------+----------+--------------+  Antecubital fossa                          not visualized  +-----------------+-------------+----------+--------------+  Prox forearm                               not visualized   +-----------------+-------------+----------+--------------+  Mid forearm                                not visualized  +-----------------+-------------+----------+--------------+  Dist forearm                               not visualized  +-----------------+-------------+----------+--------------+  Wrist                                      not visualized  +-----------------+-------------+----------+--------------+ +-----------------+-------------+----------+--------+  Left Basilic      Diameter (cm) Depth (cm) Findings  +-----------------+-------------+----------+--------+  Prox upper arm        0.91         1.10              +-----------------+-------------+----------+--------+  Mid upper arm         0.77         1.27              +-----------------+-------------+----------+--------+  Dist upper arm        0.64         0.50              +-----------------+-------------+----------+--------+  Antecubital fossa     0.76         0.59              +-----------------+-------------+----------+--------+  Prox forearm          0.17         0.18              +-----------------+-------------+----------+--------+  Mid forearm           0.20         0.20              +-----------------+-------------+----------+--------+  Distal forearm        0.21         0.19              +-----------------+-------------+----------+--------+ *See table(s) above for measurements and observations.  Diagnosing physician: Deitra Mayo MD Electronically signed by Deitra Mayo MD on 11/16/2018 at 2:52:19 PM.    Final     Time Spent in minutes  30   Lala Lund M.D on 11/19/2018 at 12:01 PM  To page go to [email or web address] - password [facility]

## 2018-11-19 NOTE — Progress Notes (Signed)
  Max Sanchez Progress Note   Assessment/ Plan:   1.  Acute Hypoxic respiratory failure:  Requiring HFNC O2--> with CXR c/w pulmonary edema in setting of worsening edema.   This has improved since starting dialysis and he's on RA now.    2.  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  Renal US was normal in 03/2018 and without obstruction now.  HD ordered as above.  Will need perm access and CLIP--> VVS to place perm access Monday.    3. Anemia:  Suspect anemia of CKD.  Iron deficient- ferrlicet with HD  4 HTN:  Markedly hypertensive at admission. Now better.  Resume home meds, BP better since beginning HD  5 BMM:  Phos up, will begin sevelamer 800 TID AC.  PTH OK   6 DM:  Recent A1cs controlled; care per primary.   7. Elevated trop: 25--> 168--> 525 now, then flattened out.  Per primary   8.  Dispo: pending CLIP and perm access  Subjective:    On dialysis this AM- treatment bumped d/t other emergent issues overnight.  Pt is feeling much better today.  Appreciate VVS c/s--> Perm acces Monday.  CLIP in process.     Objective:   BP (!) 175/88   Pulse 76   Temp 99 F (37.2 C) (Oral)   Resp 15   Ht 5\' 6"  (1.676 m)   Wt 78.8 kg   SpO2 95%   BMI 28.04 kg/m   Physical Exam: Gen: older gentleman, NAD, sitting in bed, on dialysis NECK:JVD improved CVS: RRR no m/r/g Resp: on RA now Abd: abd fullness better Ext: no LE edema  Labs: BMET Recent Labs  Lab 11/15/18 2304 11/16/18 1315 11/17/18 0340 11/18/18 0239 11/19/18 0234  NA 132* 132* 133* 133* 131*  K 5.1 4.8 4.9 3.5 4.0  CL 101 102 101 98 96*  CO2 17* 15* 14* 22 23  GLUCOSE 178* 101* 97 113* 152*  BUN 110* 116* 116* 40* 50*  CREATININE 11.30* 11.84* 12.60* 5.82* 7.52*  CALCIUM 7.2* 7.0* 7.0* 7.7* 7.6*  PHOS  --  8.0*  --   --   --    CBC Recent Labs  Lab 11/15/18 2304 11/17/18 2201 11/17/18 2320  WBC  5.4 5.5  --   NEUTROABS 3.7  --   --   HGB 8.3* 7.1* 7.3*  HCT 24.2* 20.9* 21.3*  MCV 87.1 86.4  --   PLT 227 190  --     @IMGRELPRIORS @ Medications:    . amLODipine  10 mg Oral Daily  . aspirin EC  81 mg Oral Daily  . atorvastatin  40 mg Oral q1800  . Chlorhexidine Gluconate Cloth  6 each Topical Q0600  . heparin  2.6 mL Intravenous Once  . heparin injection (subcutaneous)  5,000 Units Subcutaneous Q8H  . hydrALAZINE  50 mg Oral Q8H  . insulin aspart  0-5 Units Subcutaneous QHS  . insulin aspart  0-9 Units Subcutaneous TID WC  . labetalol  100 mg Oral Daily  . sodium chloride flush  3 mL Intravenous Q12H     Madelon Lips, MD [place] pgr [phone] 11/19/2018, 9:12 AM

## 2018-11-20 LAB — GLUCOSE, CAPILLARY
Glucose-Capillary: 108 mg/dL — ABNORMAL HIGH (ref 70–99)
Glucose-Capillary: 210 mg/dL — ABNORMAL HIGH (ref 70–99)
Glucose-Capillary: 127 mg/dL — ABNORMAL HIGH (ref 70–99)
Glucose-Capillary: 123 mg/dL — ABNORMAL HIGH (ref 70–99)

## 2018-11-20 LAB — CBC
HCT: 23.3 % — ABNORMAL LOW (ref 39.0–52.0)
Hemoglobin: 7.3 g/dL — ABNORMAL LOW (ref 13.0–17.0)
MCH: 27.8 pg (ref 26.0–34.0)
MCHC: 31.3 g/dL (ref 30.0–36.0)
MCV: 88.6 fL (ref 80.0–100.0)
Platelets: 188 10*3/uL (ref 150–400)
RBC: 2.63 MIL/uL — ABNORMAL LOW (ref 4.22–5.81)
RDW: 12.9 % (ref 11.5–15.5)
WBC: 4.7 10*3/uL (ref 4.0–10.5)
nRBC: 0 % (ref 0.0–0.2)

## 2018-11-20 LAB — BASIC METABOLIC PANEL
Anion gap: 13 (ref 5–15)
BUN: 30 mg/dL — ABNORMAL HIGH (ref 6–20)
CO2: 23 mmol/L (ref 22–32)
Calcium: 7.5 mg/dL — ABNORMAL LOW (ref 8.9–10.3)
Chloride: 95 mmol/L — ABNORMAL LOW (ref 98–111)
Creatinine, Ser: 5.53 mg/dL — ABNORMAL HIGH (ref 0.61–1.24)
GFR calc Af Amer: 13 mL/min — ABNORMAL LOW (ref >60)
GFR calc non Af Amer: 11 mL/min — ABNORMAL LOW (ref >60)
Glucose, Bld: 127 mg/dL — ABNORMAL HIGH (ref 70–99)
Potassium: 4.1 mmol/L (ref 3.5–5.1)
Sodium: 131 mmol/L — ABNORMAL LOW (ref 135–145)

## 2018-11-20 LAB — HEMOGLOBIN AND HEMATOCRIT, BLOOD
HCT: 25.1 % — ABNORMAL LOW (ref 39.0–52.0)
Hemoglobin: 8.2 g/dL — ABNORMAL LOW (ref 13.0–17.0)

## 2018-11-20 MED ORDER — SODIUM CHLORIDE 0.9% IV SOLUTION
Freq: Once | INTRAVENOUS | Status: AC
  Administered 2018-11-20: 10:00:00 via INTRAVENOUS

## 2018-11-20 NOTE — Progress Notes (Signed)
Shift assessment completed using stratus. Max Sanchez [phone]  Morning med admin using stratus. [name] [id number]  Received secure chat from MD Candiss Norse at stating patient was to receive one unit of PRBCs at next HD and inquired if received unit last night (9/19). Informed Candiss Norse patient received unit yesterday 9/19 during day and had 8.2 Hgb post-transfusion. Candiss Norse messaged stating patient was to receive transfusion at next HD. Informed Candiss Norse this RN outgoing day RN and unit transfused prior to my shift. Informed to follow up with day RN. This RN informed day RN of Max Sanchez's inquiry.  Paged MD Candiss Norse at [phone] to day RN's phone. Informed please call back to this number regarding concerns about PRBC transfusion.

## 2018-11-20 NOTE — Progress Notes (Signed)
PROGRESS NOTE                                                                                                                                                                                                             Patient Demographics:    Max Sanchez, is a 53 y.o. male, DOB - [birth year], SA:[id number]  Admit date - 11/15/2018   Admitting Physician Ivor Costa, MD  Outpatient Primary MD for the patient is Scot Jun, FNP  LOS - 4  Chief Complaint  Patient presents with   Respiratory Distress   Shortness of Breath       Brief Narrative  Max Sanchez is a 53 y.o. male with medical history significant of HTN, HLD, DM, h/o CVA (03/2018), CKD-IV, anemia, dCHF,who presents with shortness of breath.  In the ER he was diagnosed with acute on chronic CHF due to fluid overload caused by renal failure.  Nephrology was consulted and we were requested to admit the patient.    Subjective:   Patient in bed, appears comfortable, denies any headache, no fever, no chest pain or pressure, no shortness of breath , no abdominal pain. No focal weakness.   Assessment  & Plan :     1.  Acute hypoxic respiratory failure due to fluid overload caused by ESRD intern causing acute on chronic diastolic CHF recent echo in January showed EF of 60%.  Seen by nephrology, did not respond to diuretics, dialysis started on 11/16/2018 with good effect shortness of breath has improved, noted with a preserved EF of 60% and possible chronic diastolic CHF.  Currently close to being compensated  2.  Hypertension.  Currently on combination of Norvasc, hydralazine and beta-blocker.  Will adjust as needed.  Currently blood pressure slightly on the lower side.  Will cut down beta-blocker into half.  3.  AKI on CKD 5 now progressed to ESRD.  Nephrology consulted, underwent right IJ dialysis catheter placement, dialysis was started on 11/16/2018 evening.  Discussed plan of care with nephrologist Dr.  Hollie Salk on 11/19/2018, he is likely to get an outpatient dialysis spot coming Tuesday.  Also plan for fistula placement on coming Monday.  4.  Dyslipidemia.  Continue home dose statin.  5.  History of stroke.  Continue aspirin and statin for secondary prevention.    6.  Troponin elevation.  In non-ACS pattern due to stress caused by acute hypoxic respiratory failure and CHF, in the setting of ESRD.  Trend is flat, chest pain-free, EKG  nonacute. Continue aspirin, beta-blocker and statin.  Echo noted with preserved EF and no wall motion abnormality.  7.  Anemia of chronic disease.  Type screen done.  I have ordered 1 unit of packed RBC transfusion with next dialysis on 11/22/2018.  Patient and daughter have consented.  8. DM type II.  On sliding scale.  CBG (last 3)  Recent Labs    11/19/18 2215 11/20/18 0811 11/20/18 1135  GLUCAP 108* 108* 210*    Lab Results  Component Value Date   HGBA1C 6.0 (H) 11/16/2018     Family Communication  : Daughter bedside 9/15, 9/16, 9/19  Code Status : Full  Disposition Plan  : Telemetry  Consults  : Nephrology, vascular surgery  Procedures  :    R.IJ HD cath - 9/15  TTE -   1. The left ventricle has normal systolic function with an ejection fraction of 60-65%. The cavity size was mildly dilated. Left ventricular diastolic Doppler parameters are consistent with pseudonormalization. Elevated left ventricular end-diastolic pressure.  2. The right ventricle has normal systolic function. The cavity was normal. There is No increase in right ventricular wall thickness.  3. Left atrial size was severely dilated.  4. Right atrial size was normal.  5. Moderate pericardial effusion.  6. No evidence of mitral valve stenosis.  7. The aortic valve is tricuspid. Aortic valve regurgitation is mild by color flow Doppler. No stenosis of the aortic valve.  8. Pulmonic valve regurgitation is mild by color flow Doppler.  9. The aorta is normal unless otherwise  noted. 10. The inferior vena cava was is dilated in size with >50% respiratory variability, suggesting right atrial pressure of 8 mmHg.  DVT Prophylaxis  :    Heparin added  Lab Results  Component Value Date   PLT 188 11/20/2018    Diet :  Diet Order            Diet renal with fluid restriction Fluid restriction: 1200 mL Fluid; Room service appropriate? Yes; Fluid consistency: Thin  Diet effective now               Inpatient Medications Scheduled Meds:  amLODipine  10 mg Oral Daily   aspirin EC  81 mg Oral Daily   atorvastatin  40 mg Oral q1800   Chlorhexidine Gluconate Cloth  6 each Topical Q0600   heparin injection (subcutaneous)  5,000 Units Subcutaneous Q8H   hydrALAZINE  50 mg Oral Q8H   insulin aspart  0-5 Units Subcutaneous QHS   insulin aspart  0-9 Units Subcutaneous TID WC   labetalol  100 mg Oral Daily   sevelamer carbonate  800 mg Oral TID WC   sodium chloride flush  3 mL Intravenous Q12H   Continuous Infusions:  sodium chloride     sodium chloride     sodium chloride     ferric gluconate (FERRLECIT/NULECIT) IV     PRN Meds:.sodium chloride, sodium chloride, sodium chloride, acetaminophen, alteplase, heparin, hydrALAZINE, lidocaine (PF), lidocaine-prilocaine, morphine injection, nitroGLYCERIN, pentafluoroprop-tetrafluoroeth, promethazine, sodium chloride flush  Antibiotics  :   Anti-infectives (From admission, onward)   None          Objective:   Vitals:   11/20/18 0315 11/20/18 0612 11/20/18 0613 11/20/18 1025  BP: (!) 151/71 (!) 150/79 (!) 150/79 (!) 159/78  Pulse: 80  72 80  Resp: (!) 22  11 15   Temp: 98 F (36.7 C)  (!) 97.3 F (36.3 C) 98.1 F (36.7 C)  TempSrc: Oral  Oral  SpO2: 97%  96% 96%  Weight:      Height:        Wt Readings from Last 3 Encounters:  11/19/18 76.8 kg  04/22/18 80.3 kg  04/22/18 80.5 kg     Intake/Output Summary (Last 24 hours) at 11/20/2018 1141 Last data filed at 11/20/2018 1136 Gross  per 24 hour  Intake 240 ml  Output 500 ml  Net -260 ml     Physical Exam  Awake Alert,   No new F.N deficits, Normal affect [place].AT,PERRAL Supple Neck,No JVD, No cervical lymphadenopathy appriciated.  Symmetrical Chest wall movement, Good air movement bilaterally, CTAB RRR,No Gallops, Rubs or new Murmurs, No Parasternal Heave +ve B.Sounds, Abd Soft, No tenderness, No organomegaly appriciated, No rebound - guarding or rigidity. No Cyanosis, Clubbing or edema, No new Rash or bruise     Data Review:    CBC Recent Labs  Lab 11/15/18 2304 11/17/18 2201 11/17/18 2320 11/20/18 0322  WBC 5.4 5.5  --  4.7  HGB 8.3* 7.1* 7.3* 7.3*  HCT 24.2* 20.9* 21.3* 23.3*  PLT 227 190  --  188  MCV 87.1 86.4  --  88.6  MCH 29.9 29.3  --  27.8  MCHC 34.3 34.0  --  31.3  RDW 12.9 13.2  --  12.9  LYMPHSABS 1.0  --   --   --   MONOABS 0.5  --   --   --   EOSABS 0.2  --   --   --   BASOSABS 0.0  --   --   --     Chemistries  Recent Labs  Lab 11/15/18 2304 11/16/18 1315 11/17/18 0340 11/18/18 0239 11/19/18 0234 11/20/18 0322  NA 132* 132* 133* 133* 131* 131*  K 5.1 4.8 4.9 3.5 4.0 4.1  CL 101 102 101 98 96* 95*  CO2 17* 15* 14* 22 23 23   GLUCOSE 178* 101* 97 113* 152* 127*  BUN 110* 116* 116* 40* 50* 30*  CREATININE 11.30* 11.84* 12.60* 5.82* 7.52* 5.53*  CALCIUM 7.2* 7.0* 7.0* 7.7* 7.6* 7.5*  AST 16  --   --   --   --   --   ALT 21  --   --   --   --   --   ALKPHOS 72  --   --   --   --   --   BILITOT 0.6  --   --   --   --   --    ------------------------------------------------------------------------------------------------------------------ No results for input(s): CHOL, HDL, LDLCALC, TRIG, CHOLHDL, LDLDIRECT in the last 72 hours.  Lab Results  Component Value Date   HGBA1C 6.0 (H) 11/16/2018   ------------------------------------------------------------------------------------------------------------------ No results for input(s): TSH, T4TOTAL, T3FREE, THYROIDAB in the  last 72 hours.  Invalid input(s): FREET3 ------------------------------------------------------------------------------------------------------------------ No results for input(s): VITAMINB12, FOLATE, FERRITIN, TIBC, IRON, RETICCTPCT in the last 72 hours.  Coagulation profile Recent Labs  Lab 11/16/18 0155  INR 1.1    No results for input(s): DDIMER in the last 72 hours.  Cardiac Enzymes No results for input(s): CKMB, TROPONINI, MYOGLOBIN in the last 168 hours.  Invalid input(s): CK ------------------------------------------------------------------------------------------------------------------    Component Value Date/Time   BNP 1,378.9 (H) 11/15/2018 2304    Micro Results Recent Results (from the past 240 hour(s))  SARS Coronavirus 2 [facility] order, Performed in [facility] hospital lab) Nasopharyngeal Nasopharyngeal Swab     Status: None   Collection Time: 11/15/18 11:15 PM   Specimen:  Nasopharyngeal Swab  Result Value Ref Range Status   SARS Coronavirus 2 NEGATIVE NEGATIVE Final    Comment: (NOTE) If result is NEGATIVE SARS-CoV-2 target nucleic acids are NOT DETECTED. The SARS-CoV-2 RNA is generally detectable in upper and lower  respiratory specimens during the acute phase of infection. The lowest  concentration of SARS-CoV-2 viral copies this assay can detect is 250  copies / mL. A negative result does not preclude SARS-CoV-2 infection  and should not be used as the sole basis for treatment or other  patient management decisions.  A negative result may occur with  improper specimen collection / handling, submission of specimen other  than nasopharyngeal swab, presence of viral mutation(s) within the  areas targeted by this assay, and inadequate number of viral copies  (<250 copies / mL). A negative result must be combined with clinical  observations, patient history, and epidemiological information. If result is POSITIVE SARS-CoV-2 target nucleic acids are  DETECTED. The SARS-CoV-2 RNA is generally detectable in upper and lower  respiratory specimens dur ing the acute phase of infection.  Positive  results are indicative of active infection with SARS-CoV-2.  Clinical  correlation with patient history and other diagnostic information is  necessary to determine patient infection status.  Positive results do  not rule out bacterial infection or co-infection with other viruses. If result is PRESUMPTIVE POSTIVE SARS-CoV-2 nucleic acids MAY BE PRESENT.   A presumptive positive result was obtained on the submitted specimen  and confirmed on repeat testing.  While 2019 novel coronavirus  (SARS-CoV-2) nucleic acids may be present in the submitted sample  additional confirmatory testing may be necessary for epidemiological  and / or clinical management purposes  to differentiate between  SARS-CoV-2 and other Sarbecovirus currently known to infect humans.  If clinically indicated additional testing with an alternate test  methodology [phone]) is advised. The SARS-CoV-2 RNA is generally  detectable in upper and lower respiratory sp ecimens during the acute  phase of infection. The expected result is Negative. Fact Sheet for Patients:  StrictlyIdeas.no Fact Sheet for Healthcare Providers: BankingDealers.co.za This test is not yet approved or cleared by the [place] FDA and has been authorized for detection and/or diagnosis of SARS-CoV-2 by FDA under an Emergency Use Authorization (EUA).  This EUA will remain in effect (meaning this test can be used) for the duration of the COVID-19 declaration under Section 564(b)(1) of the Act, 21 U.S.C. section 360bbb-3(b)(1), unless the authorization is terminated or revoked sooner. Performed at [facility], [place] [address] [place], [place] [address]     Radiology Reports US Renal  Result Date: 11/16/2018 CLINICAL DATA:  AKI with elevated BUN and  creatinine EXAM: RENAL / URINARY TRACT ULTRASOUND COMPLETE COMPARISON:  Ultrasound 03/10/2018 FINDINGS: Right Kidney: Renal measurements: 10.3 x 4.6 x 4.7 cm = volume: 114 mL . Echogenicity within normal limits. No mass or hydronephrosis visualized. Left Kidney: Renal measurements: 9.4 x 5.6 x 4.6 cm = volume: 125 mL. Echogenicity within normal limits. No mass or hydronephrosis visualized. Previously seen anechoic cyst in the interpolar region is no longer visualized. Bladder: Appears normal for degree of bladder distention. Other: Bilateral pleural effusions. IMPRESSION: Incidentally noted bilateral pleural effusions. Previously seen left renal cyst no longer visualized. Otherwise unremarkable renal ultrasound. Electronically Signed   By: Lovena Le M.D.   On: 11/16/2018 02:21   Ir Fluoro Guide Cv Line Right  Result Date: 11/17/2018 CLINICAL DATA:  Acute on chronic kidney disease EXAM: EXAM RIGHT  IJ CATHETER PLACEMENT UNDER ULTRASOUND AND FLUOROSCOPIC GUIDANCE TECHNIQUE: The procedure, risks (including but not limited to bleeding, infection, organ damage, pneumothorax), benefits, and alternatives were explained to the patient. Questions regarding the procedure were encouraged and answered. The patient understands and consents to the procedure. Patency of the right IJ vein was confirmed with ultrasound with image documentation. An appropriate skin site was determined. Skin site was marked. Region was prepped using maximum barrier technique including cap and mask, sterile gown, sterile gloves, large sterile sheet, and Chlorhexidine as cutaneous antisepsis. The region was infiltrated locally with 1% lidocaine. Under real-time ultrasound guidance, the right IJ vein was accessed with a 19 gauge needle; the needle tip within the vein was confirmed with ultrasound image documentation. The needle exchanged over a guidewire for vascular dilator which allowed advancement of a 16 cm Trialysis catheter. This was  positioned with the tip at the cavoatrial junction. Spot chest radiograph shows good positioning and no pneumothorax. Catheter was flushed and sutured externally with 0-Prolene sutures. Patient tolerated the procedure well. FLUOROSCOPY TIME:  26 seconds, 1 image was saved. COMPLICATIONS: COMPLICATIONS none IMPRESSION: Technically successful right IJ Trialysis catheter placement. The catheter is ready for immediate use as clinically indicated. Electronically Signed   By: Constance Holster M.D.   On: 11/17/2018 08:04   Ir US Guide Vasc Access Right  Result Date: 11/17/2018 CLINICAL DATA:  Acute on chronic kidney disease EXAM: EXAM RIGHT IJ CATHETER PLACEMENT UNDER ULTRASOUND AND FLUOROSCOPIC GUIDANCE TECHNIQUE: The procedure, risks (including but not limited to bleeding, infection, organ damage, pneumothorax), benefits, and alternatives were explained to the patient. Questions regarding the procedure were encouraged and answered. The patient understands and consents to the procedure. Patency of the right IJ vein was confirmed with ultrasound with image documentation. An appropriate skin site was determined. Skin site was marked. Region was prepped using maximum barrier technique including cap and mask, sterile gown, sterile gloves, large sterile sheet, and Chlorhexidine as cutaneous antisepsis. The region was infiltrated locally with 1% lidocaine. Under real-time ultrasound guidance, the right IJ vein was accessed with a 19 gauge needle; the needle tip within the vein was confirmed with ultrasound image documentation. The needle exchanged over a guidewire for vascular dilator which allowed advancement of a 16 cm Trialysis catheter. This was positioned with the tip at the cavoatrial junction. Spot chest radiograph shows good positioning and no pneumothorax. Catheter was flushed and sutured externally with 0-Prolene sutures. Patient tolerated the procedure well. FLUOROSCOPY TIME:  26 seconds, 1 image was saved.  COMPLICATIONS: COMPLICATIONS none IMPRESSION: Technically successful right IJ Trialysis catheter placement. The catheter is ready for immediate use as clinically indicated. Electronically Signed   By: Constance Holster M.D.   On: 11/17/2018 08:04   Dg Chest Port 1 View  Result Date: 11/15/2018 CLINICAL DATA:  Shortness of breath EXAM: PORTABLE CHEST 1 VIEW COMPARISON:  03/10/2018 FINDINGS: Cardiac shadow remains mildly enlarged. Diffuse vascular congestion is noted with interstitial edema and bilateral effusions. Bibasilar atelectasis is noted as well. IMPRESSION: Changes consistent with CHF with bibasilar atelectasis. Electronically Signed   By: Inez Catalina M.D.   On: 11/15/2018 23:20   Vas [place] Upper Ext Vein Mapping (pre-op Avf)  Result Date: 11/16/2018 UPPER EXTREMITY VEIN MAPPING  Indications: Pre-access. Comparison Study: no prior Performing Technologist: Abram Sander RVS  Examination Guidelines: A complete evaluation includes B-mode imaging, spectral Doppler, color Doppler, and power Doppler as needed of all accessible portions of each vessel. Bilateral testing is considered an integral  part of a complete examination. Limited examinations for reoccurring indications may be performed as noted. +-----------------+-------------+----------+--------+  Right Cephalic    Diameter (cm) Depth (cm) Findings  +-----------------+-------------+----------+--------+  Shoulder              0.63         0.62              +-----------------+-------------+----------+--------+  Prox upper arm        0.59         0.51              +-----------------+-------------+----------+--------+  Mid upper arm         0.50         0.26              +-----------------+-------------+----------+--------+  Dist upper arm        0.47         0.28              +-----------------+-------------+----------+--------+  Antecubital fossa     0.64         0.38              +-----------------+-------------+----------+--------+  Prox forearm           0.50         0.35              +-----------------+-------------+----------+--------+  Mid forearm           0.37         0.26              +-----------------+-------------+----------+--------+  Dist forearm          0.32         0.19              +-----------------+-------------+----------+--------+ +-----------------+-------------+----------+--------+  Right Basilic     Diameter (cm) Depth (cm) Findings  +-----------------+-------------+----------+--------+  Prox upper arm        0.41         1.43              +-----------------+-------------+----------+--------+  Mid upper arm         0.51         1.24              +-----------------+-------------+----------+--------+  Dist upper arm        0.54         0.63              +-----------------+-------------+----------+--------+  Antecubital fossa     0.65         0.59              +-----------------+-------------+----------+--------+  Prox forearm          0.26         0.23              +-----------------+-------------+----------+--------+  Mid forearm           0.24         0.22              +-----------------+-------------+----------+--------+  Distal forearm        0.27         0.26              +-----------------+-------------+----------+--------+ +-----------------+-------------+----------+--------------+  Left Cephalic     Diameter (cm) Depth (cm)    Findings     +-----------------+-------------+----------+--------------+  Shoulder  0.31         0.39      branching     +-----------------+-------------+----------+--------------+  Prox upper arm        0.12         0.21      branching     +-----------------+-------------+----------+--------------+  Mid upper arm                              not visualized  +-----------------+-------------+----------+--------------+  Dist upper arm                             not visualized  +-----------------+-------------+----------+--------------+  Antecubital fossa                          not visualized   +-----------------+-------------+----------+--------------+  Prox forearm                               not visualized  +-----------------+-------------+----------+--------------+  Mid forearm                                not visualized  +-----------------+-------------+----------+--------------+  Dist forearm                               not visualized  +-----------------+-------------+----------+--------------+  Wrist                                      not visualized  +-----------------+-------------+----------+--------------+ +-----------------+-------------+----------+--------+  Left Basilic      Diameter (cm) Depth (cm) Findings  +-----------------+-------------+----------+--------+  Prox upper arm        0.91         1.10              +-----------------+-------------+----------+--------+  Mid upper arm         0.77         1.27              +-----------------+-------------+----------+--------+  Dist upper arm        0.64         0.50              +-----------------+-------------+----------+--------+  Antecubital fossa     0.76         0.59              +-----------------+-------------+----------+--------+  Prox forearm          0.17         0.18              +-----------------+-------------+----------+--------+  Mid forearm           0.20         0.20              +-----------------+-------------+----------+--------+  Distal forearm        0.21         0.19              +-----------------+-------------+----------+--------+ *See table(s) above for measurements and observations.  Diagnosing physician: Deitra Mayo MD Electronically signed by Deitra Mayo MD on 11/16/2018 at 2:52:19 PM.  Final     Time Spent in minutes  30   Lala Lund M.D on 11/20/2018 at 11:41 AM  To page go to [email or web address] - password [facility]

## 2018-11-20 NOTE — Progress Notes (Signed)
  [facility] Progress Note   Assessment/ Plan:   1.  Acute Hypoxic respiratory failure:  Requiring HFNC O2--> with CXR c/w pulmonary edema in setting of worsening edema.   This has improved since starting dialysis and he's on RA now.    2.  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  Renal US was normal in 03/2018 and without obstruction now.  HD ordered as above.  Will need perm access and CLIP--> VVS to place perm access Monday.  HD #3 yesterday  3. Anemia:  Suspect anemia of CKD.  Iron deficient- ferrlicet with HD  4 HTN:  Markedly hypertensive at admission. Now better.  Resume home meds, BP better since beginning HD  5 BMM:  Phos up, will begin sevelamer 800 TID AC.  PTH OK   6 DM:  Recent A1cs controlled; care per primary.   7. Elevated trop: 25--> 168--> 525 now, then flattened out.  Per primary   8.  Dispo: pending CLIP and perm access  Subjective:   HD #3 yesterday, doing well.  For [facility] and fistula on Monday.     Objective:   BP (!) 159/78   Pulse 80   Temp 98.1 F (36.7 C) (Oral)   Resp 15   Ht 5\' 6"  (1.676 m)   Wt 76.8 kg   SpO2 96%   BMI 27.33 kg/m   Physical Exam: Gen: older gentleman, NAD, sitting at side of bed, NAS NECK:JVD improved CVS: RRR no m/r/g Resp: on RA now Abd: abd fullness better Ext: no LE edema  Labs: BMET Recent Labs  Lab 11/15/18 2304 11/16/18 1315 11/17/18 0340 11/18/18 0239 11/19/18 0234 11/20/18 0322  NA 132* 132* 133* 133* 131* 131*  K 5.1 4.8 4.9 3.5 4.0 4.1  CL 101 102 101 98 96* 95*  CO2 17* 15* 14* 22 23 23   GLUCOSE 178* 101* 97 113* 152* 127*  BUN 110* 116* 116* 40* 50* 30*  CREATININE 11.30* 11.84* 12.60* 5.82* 7.52* 5.53*  CALCIUM 7.2* 7.0* 7.0* 7.7* 7.6* 7.5*  PHOS  --  8.0*  --   --   --   --    CBC Recent Labs  Lab 11/15/18 2304 11/17/18 2201 11/17/18 2320 11/20/18 0322  WBC 5.4 5.5  --  4.7   NEUTROABS 3.7  --   --   --   HGB 8.3* 7.1* 7.3* 7.3*  HCT 24.2* 20.9* 21.3* 23.3*  MCV 87.1 86.4  --  88.6  PLT 227 190  --  188    @IMGRELPRIORS @ Medications:    . amLODipine  10 mg Oral Daily  . aspirin EC  81 mg Oral Daily  . atorvastatin  40 mg Oral q1800  . Chlorhexidine Gluconate Cloth  6 each Topical Q0600  . heparin injection (subcutaneous)  5,000 Units Subcutaneous Q8H  . hydrALAZINE  50 mg Oral Q8H  . insulin aspart  0-5 Units Subcutaneous QHS  . insulin aspart  0-9 Units Subcutaneous TID WC  . labetalol  100 mg Oral Daily  . sevelamer carbonate  800 mg Oral TID WC  . sodium chloride flush  3 mL Intravenous Q12H     Madelon Lips, MD [facility] pgr [phone] 11/20/2018, 11:48 AM

## 2018-11-21 LAB — GLUCOSE, CAPILLARY
Glucose-Capillary: 113 mg/dL — ABNORMAL HIGH (ref 70–99)
Glucose-Capillary: 171 mg/dL — ABNORMAL HIGH (ref 70–99)
Glucose-Capillary: 151 mg/dL — ABNORMAL HIGH (ref 70–99)
Glucose-Capillary: 221 mg/dL — ABNORMAL HIGH (ref 70–99)

## 2018-11-21 LAB — TYPE AND SCREEN
ABO/RH(D): A POS
Antibody Screen: NEGATIVE
Unit division: 0

## 2018-11-21 LAB — BASIC METABOLIC PANEL
Anion gap: 13 (ref 5–15)
BUN: 41 mg/dL — ABNORMAL HIGH (ref 6–20)
CO2: 23 mmol/L (ref 22–32)
Calcium: 7.8 mg/dL — ABNORMAL LOW (ref 8.9–10.3)
Chloride: 94 mmol/L — ABNORMAL LOW (ref 98–111)
Creatinine, Ser: 6.79 mg/dL — ABNORMAL HIGH (ref 0.61–1.24)
GFR calc Af Amer: 10 mL/min — ABNORMAL LOW (ref >60)
GFR calc non Af Amer: 9 mL/min — ABNORMAL LOW (ref >60)
Glucose, Bld: 118 mg/dL — ABNORMAL HIGH (ref 70–99)
Potassium: 4.5 mmol/L (ref 3.5–5.1)
Sodium: 130 mmol/L — ABNORMAL LOW (ref 135–145)

## 2018-11-21 LAB — BPAM RBC
Blood Product Expiration Date: 202010152359
ISSUE DATE / TIME: 202009191023
Unit Type and Rh: 6200

## 2018-11-21 MED ORDER — SODIUM CHLORIDE 0.9 % IV SOLN
1.5000 g | INTRAVENOUS | Status: AC
  Administered 2018-11-22: 14:00:00 1.5 g via INTRAVENOUS
  Filled 2018-11-21: qty 1.5

## 2018-11-21 NOTE — H&P (View-Only) (Signed)
[facility]  Subjective  - questions answered through daughter interpreting   Objective (!) 156/81 78 98.4 F (36.9 C) (Oral) 10 98%  Intake/Output Summary (Last 24 hours) at 11/21/2018 0933 Last data filed at 11/21/2018 [id number] Gross per 24 hour  Intake 823 ml  Output -  Net 823 ml   IV in left arm  Assessment/Planning: Pt with 3 mm left basilic vein upper arm, 4 mm right cephalic vein upper arm Apparently Dr Donzetta Matters has d/w pt using non dominant left arm TDC and left arm fistula vs graft by Dr Early tomorrow NPO consent Need to move IV from left arm to right arm  Ruta Hinds 11/21/2018 9:33 AM --  Laboratory Lab Results: Recent Labs    11/20/18 0322 11/20/18 1902  WBC 4.7  --   HGB 7.3* 8.2*  HCT 23.3* 25.1*  PLT 188  --    BMET Recent Labs    11/20/18 0322 11/21/18 0325  NA 131* 130*  K 4.1 4.5  CL 95* 94*  CO2 23 23  GLUCOSE 127* 118*  BUN 30* 41*  CREATININE 5.53* 6.79*  CALCIUM 7.5* 7.8*    COAG Lab Results  Component Value Date   INR 1.1 11/16/2018   INR 0.96 04/22/2018   INR 1.02 03/10/2018   No results found for: PTT

## 2018-11-21 NOTE — Progress Notes (Signed)
  [facility] Progress Note   Assessment/ Plan:   1.  Acute Hypoxic respiratory failure:  Requiring HFNC O2--> with CXR c/w pulmonary edema in setting of worsening edema.   This has improved since starting dialysis and he's on RA now.    2.  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  Renal US was normal in 03/2018 and without obstruction now.  HD ordered as above.  Will need perm access and CLIP--> VVS to place perm access Monday. On MWF sched-- next HD tomorrow  3. Anemia:  Suspect anemia of CKD.  Iron deficient- ferrlicet with HD  4 HTN:  Markedly hypertensive at admission. Now better.  Resume home meds, BP better since beginning HD  5 BMM:  Phos up, will begin sevelamer 800 TID AC.  PTH OK   6 DM:  Recent A1cs controlled; care per primary.   7. Elevated trop: 25--> 168--> 525 now, then flattened out.  Per primary   8.  Dispo: pending CLIP and perm access  Subjective:   For [facility] and fistula tomorrow.  No complaints.  Drinking coconut juice   Objective:   BP (!) 165/85 (BP Location: Left Arm)   Pulse 78   Temp 99 F (37.2 C) (Oral)   Resp 15   Ht 5\' 6"  (1.676 m)   Wt 78.8 kg   SpO2 99%   BMI 28.04 kg/m   Physical Exam: Gen: older gentleman, NAD, sitting at side of bed, NAS NECK:JVD improved CVS: RRR no m/r/g Resp: on RA now Abd: abd fullness better Ext: no LE edema  Labs: BMET Recent Labs  Lab 11/15/18 2304 11/16/18 1315 11/17/18 0340 11/18/18 0239 11/19/18 0234 11/20/18 0322 11/21/18 0325  NA 132* 132* 133* 133* 131* 131* 130*  K 5.1 4.8 4.9 3.5 4.0 4.1 4.5  CL 101 102 101 98 96* 95* 94*  CO2 17* 15* 14* 22 23 23 23   GLUCOSE 178* 101* 97 113* 152* 127* 118*  BUN 110* 116* 116* 40* 50* 30* 41*  CREATININE 11.30* 11.84* 12.60* 5.82* 7.52* 5.53* 6.79*  CALCIUM 7.2* 7.0* 7.0* 7.7* 7.6* 7.5* 7.8*  PHOS  --  8.0*  --   --   --   --   --     CBC Recent Labs  Lab 11/15/18 2304 11/17/18 2201 11/17/18 2320 11/20/18 0322 11/20/18 1902  WBC 5.4 5.5  --  4.7  --   NEUTROABS 3.7  --   --   --   --   HGB 8.3* 7.1* 7.3* 7.3* 8.2*  HCT 24.2* 20.9* 21.3* 23.3* 25.1*  MCV 87.1 86.4  --  88.6  --   PLT 227 190  --  188  --     @IMGRELPRIORS @ Medications:    . amLODipine  10 mg Oral Daily  . aspirin EC  81 mg Oral Daily  . atorvastatin  40 mg Oral q1800  . Chlorhexidine Gluconate Cloth  6 each Topical Q0600  . heparin injection (subcutaneous)  5,000 Units Subcutaneous Q8H  . hydrALAZINE  50 mg Oral Q8H  . insulin aspart  0-5 Units Subcutaneous QHS  . insulin aspart  0-9 Units Subcutaneous TID WC  . labetalol  100 mg Oral Daily  . sevelamer carbonate  800 mg Oral TID WC     Madelon Lips, MD [facility] pgr [phone] 11/21/2018, 1:51 PM

## 2018-11-21 NOTE — Progress Notes (Signed)
[facility] [place]  Subjective  - questions answered through daughter interpreting   Objective (!) 156/81 78 98.4 F (36.9 C) (Oral) 10 98%  Intake/Output Summary (Last 24 hours) at 11/21/2018 0933 Last data filed at 11/21/2018 [id number] Gross per 24 hour  Intake 823 ml  Output -  Net 823 ml   IV in left arm  Assessment/Planning: Pt with 3 mm left basilic vein upper arm, 4 mm right cephalic vein upper arm Apparently Dr Donzetta Matters has d/w pt using non dominant left arm TDC and left arm fistula vs graft by Dr Early tomorrow NPO consent Need to move IV from left arm to right arm  Ruta Hinds 11/21/2018 9:33 AM --  Laboratory Lab Results: Recent Labs    11/20/18 0322 11/20/18 1902  WBC 4.7  --   HGB 7.3* 8.2*  HCT 23.3* 25.1*  PLT 188  --    BMET Recent Labs    11/20/18 0322 11/21/18 0325  NA 131* 130*  K 4.1 4.5  CL 95* 94*  CO2 23 23  GLUCOSE 127* 118*  BUN 30* 41*  CREATININE 5.53* 6.79*  CALCIUM 7.5* 7.8*    COAG Lab Results  Component Value Date   INR 1.1 11/16/2018   INR 0.96 04/22/2018   INR 1.02 03/10/2018   No results found for: PTT

## 2018-11-21 NOTE — Progress Notes (Signed)
PROGRESS NOTE                                                                                                                                                                                                             Patient Demographics:    Max Sanchez, is a 53 y.o. male, DOB - 02-Sep-1965, SA:[id number]  Admit date - 11/15/2018   Admitting Physician Ivor Costa, MD  Outpatient Primary MD for the patient is Scot Jun, FNP  LOS - 5  Chief Complaint  Patient presents with   Respiratory Distress   Shortness of Breath       Brief Narrative  Max Sanchez is a 53 y.o. male with medical history significant of HTN, HLD, DM, h/o CVA (03/2018), CKD-IV, anemia, dCHF,who presents with shortness of breath.  In the ER he was diagnosed with acute on chronic CHF due to fluid overload caused by renal failure.  Nephrology was consulted and we were requested to admit the patient.    Subjective:   Patient in bed, appears comfortable, denies any headache, no fever, no chest pain or pressure, no shortness of breath , no abdominal pain. No focal weakness.   Assessment  & Plan :     1.  Acute hypoxic respiratory failure due to fluid overload caused by ESRD intern causing acute on chronic diastolic CHF recent echo in January showed EF of 60%.  Seen by nephrology, did not respond to diuretics, dialysis started on 11/16/2018 with good effect shortness of breath has improved, noted with a preserved EF of 60% and possible chronic diastolic CHF.  Currently close to being compensated.  He is due for AV fistula placement by vascular surgery on 11/22/2018.  2.  Hypertension.  Currently on combination of Norvasc, hydralazine and beta-blocker.  Blood pressure stable now.  Home dose beta-blocker has been cut in half.  3.  AKI on CKD 5 now progressed to ESRD.  Nephrology consulted, underwent right IJ dialysis catheter placement, dialysis was started on 11/16/2018 evening.  Discussed plan of  care with nephrologist Dr. Hollie Salk on 11/19/2018, he is likely to get an outpatient dialysis spot coming Tuesday.  Also plan for fistula placement on coming Monday.  4.  Dyslipidemia.  Continue home dose statin.  5.  History of stroke.  Continue aspirin and statin for secondary prevention.    6.  Troponin elevation.  In non-ACS pattern due to stress caused by acute hypoxic respiratory failure and CHF, in the setting of ESRD.  Trend is flat, chest pain-free, EKG nonacute. Continue aspirin, beta-blocker and statin.  Echo noted with preserved EF and no wall motion abnormality.  7.  Anemia of chronic disease.  Received 1 unit of packed RBC on 11/20/2018.  Stable.  8. DM type II.  On sliding scale.  CBG (last 3)  Recent Labs    11/20/18 1658 11/20/18 2126 11/21/18 0743  GLUCAP 127* 123* 113*    Lab Results  Component Value Date   HGBA1C 6.0 (H) 11/16/2018     Family Communication  : Daughter bedside 9/15, 9/16, 9/19  Code Status : Full  Disposition Plan  : Telemetry  Consults  : Nephrology, vascular surgery  Procedures  :    R.IJ HD cath - 9/15  TTE -   1. The left ventricle has normal systolic function with an ejection fraction of 60-65%. The cavity size was mildly dilated. Left ventricular diastolic Doppler parameters are consistent with pseudonormalization. Elevated left ventricular end-diastolic pressure.  2. The right ventricle has normal systolic function. The cavity was normal. There is No increase in right ventricular wall thickness.  3. Left atrial size was severely dilated.  4. Right atrial size was normal.  5. Moderate pericardial effusion.  6. No evidence of mitral valve stenosis.  7. The aortic valve is tricuspid. Aortic valve regurgitation is mild by color flow Doppler. No stenosis of the aortic valve.  8. Pulmonic valve regurgitation is mild by color flow Doppler.  9. The aorta is normal unless otherwise noted. 10. The inferior vena cava was is dilated in size  with >50% respiratory variability, suggesting right atrial pressure of 8 mmHg.  DVT Prophylaxis  :    Heparin added  Lab Results  Component Value Date   PLT 188 11/20/2018    Diet :  Diet Order            Diet NPO time specified Except for: Sips with Meds  Diet effective midnight        Diet renal with fluid restriction Fluid restriction: 1200 mL Fluid; Room service appropriate? Yes; Fluid consistency: Thin  Diet effective now               Inpatient Medications Scheduled Meds:  amLODipine  10 mg Oral Daily   aspirin EC  81 mg Oral Daily   atorvastatin  40 mg Oral q1800   Chlorhexidine Gluconate Cloth  6 each Topical Q0600   heparin injection (subcutaneous)  5,000 Units Subcutaneous Q8H   hydrALAZINE  50 mg Oral Q8H   insulin aspart  0-5 Units Subcutaneous QHS   insulin aspart  0-9 Units Subcutaneous TID WC   labetalol  100 mg Oral Daily   sevelamer carbonate  800 mg Oral TID WC   Continuous Infusions:  sodium chloride     sodium chloride     [START ON 11/22/2018] cefUROXime (ZINACEF)  IV     ferric gluconate (FERRLECIT/NULECIT) IV Stopped (11/20/18 1343)   PRN Meds:.sodium chloride, sodium chloride, acetaminophen, alteplase, heparin, hydrALAZINE, lidocaine (PF), lidocaine-prilocaine, morphine injection, nitroGLYCERIN, pentafluoroprop-tetrafluoroeth, promethazine  Antibiotics  :   Anti-infectives (From admission, onward)   Start     Dose/Rate Route Frequency Ordered Stop   11/22/18 0600  cefUROXime (ZINACEF) 1.5 g in sodium chloride 0.9 % 100 mL IVPB     1.5 g 200 mL/hr over 30 Minutes Intravenous To Short Stay 11/21/18 0732 11/23/18 0600          Objective:   Vitals:   11/21/18 0500  11/21/18 0555 11/21/18 0600 11/21/18 0800  BP:  (!) 156/81    Pulse:   78   Resp:   16 10  Temp:  98.4 F (36.9 C)    TempSrc:  Oral    SpO2:   98%   Weight: 78.8 kg     Height:        Wt Readings from Last 3 Encounters:  11/21/18 78.8 kg  04/22/18 80.3  kg  04/22/18 80.5 kg     Intake/Output Summary (Last 24 hours) at 11/21/2018 0953 Last data filed at 11/21/2018 0926 Gross per 24 hour  Intake 823 ml  Output --  Net 823 ml     Physical Exam  Awake Alert,   No new F.N deficits, Normal affect Myton.AT,PERRAL Supple Neck,No JVD, No cervical lymphadenopathy appriciated.  Symmetrical Chest wall movement, Good air movement bilaterally, CTAB RRR,No Gallops, Rubs or new Murmurs, No Parasternal Heave +ve B.Sounds, Abd Soft, No tenderness, No organomegaly appriciated, No rebound - guarding or rigidity. No Cyanosis, Clubbing or edema, No new Rash or bruise    Data Review:    CBC Recent Labs  Lab 11/15/18 2304 11/17/18 2201 11/17/18 2320 11/20/18 0322 11/20/18 1902  WBC 5.4 5.5  --  4.7  --   HGB 8.3* 7.1* 7.3* 7.3* 8.2*  HCT 24.2* 20.9* 21.3* 23.3* 25.1*  PLT 227 190  --  188  --   MCV 87.1 86.4  --  88.6  --   MCH 29.9 29.3  --  27.8  --   MCHC 34.3 34.0  --  31.3  --   RDW 12.9 13.2  --  12.9  --   LYMPHSABS 1.0  --   --   --   --   MONOABS 0.5  --   --   --   --   EOSABS 0.2  --   --   --   --   BASOSABS 0.0  --   --   --   --     Chemistries  Recent Labs  Lab 11/15/18 2304  11/17/18 0340 11/18/18 0239 11/19/18 0234 11/20/18 0322 11/21/18 0325  NA 132*   < > 133* 133* 131* 131* 130*  K 5.1   < > 4.9 3.5 4.0 4.1 4.5  CL 101   < > 101 98 96* 95* 94*  CO2 17*   < > 14* 22 23 23 23   GLUCOSE 178*   < > 97 113* 152* 127* 118*  BUN 110*   < > 116* 40* 50* 30* 41*  CREATININE 11.30*   < > 12.60* 5.82* 7.52* 5.53* 6.79*  CALCIUM 7.2*   < > 7.0* 7.7* 7.6* 7.5* 7.8*  AST 16  --   --   --   --   --   --   ALT 21  --   --   --   --   --   --   ALKPHOS 72  --   --   --   --   --   --   BILITOT 0.6  --   --   --   --   --   --    < > = values in this interval not displayed.   ------------------------------------------------------------------------------------------------------------------ No results for input(s): CHOL,  HDL, LDLCALC, TRIG, CHOLHDL, LDLDIRECT in the last 72 hours.  Lab Results  Component Value Date   HGBA1C 6.0 (H) 11/16/2018   ------------------------------------------------------------------------------------------------------------------ No results for input(s): TSH, T4TOTAL,  T3FREE, THYROIDAB in the last 72 hours.  Invalid input(s): FREET3 ------------------------------------------------------------------------------------------------------------------ No results for input(s): VITAMINB12, FOLATE, FERRITIN, TIBC, IRON, RETICCTPCT in the last 72 hours.  Coagulation profile Recent Labs  Lab 11/16/18 0155  INR 1.1    No results for input(s): DDIMER in the last 72 hours.  Cardiac Enzymes No results for input(s): CKMB, TROPONINI, MYOGLOBIN in the last 168 hours.  Invalid input(s): CK ------------------------------------------------------------------------------------------------------------------    Component Value Date/Time   BNP 1,378.9 (H) 11/15/2018 2304    Micro Results Recent Results (from the past 240 hour(s))  SARS Coronavirus 2 [facility] order, Performed in [facility] hospital lab) Nasopharyngeal Nasopharyngeal Swab     Status: None   Collection Time: 11/15/18 11:15 PM   Specimen: Nasopharyngeal Swab  Result Value Ref Range Status   SARS Coronavirus 2 NEGATIVE NEGATIVE Final    Comment: (NOTE) If result is NEGATIVE SARS-CoV-2 target nucleic acids are NOT DETECTED. The SARS-CoV-2 RNA is generally detectable in upper and lower  respiratory specimens during the acute phase of infection. The lowest  concentration of SARS-CoV-2 viral copies this assay can detect is 250  copies / mL. A negative result does not preclude SARS-CoV-2 infection  and should not be used as the sole basis for treatment or other  patient management decisions.  A negative result may occur with  improper specimen collection / handling, submission of specimen other  than nasopharyngeal swab,  presence of viral mutation(s) within the  areas targeted by this assay, and inadequate number of viral copies  (<250 copies / mL). A negative result must be combined with clinical  observations, patient history, and epidemiological information. If result is POSITIVE SARS-CoV-2 target nucleic acids are DETECTED. The SARS-CoV-2 RNA is generally detectable in upper and lower  respiratory specimens dur ing the acute phase of infection.  Positive  results are indicative of active infection with SARS-CoV-2.  Clinical  correlation with patient history and other diagnostic information is  necessary to determine patient infection status.  Positive results do  not rule out bacterial infection or co-infection with other viruses. If result is PRESUMPTIVE POSTIVE SARS-CoV-2 nucleic acids MAY BE PRESENT.   A presumptive positive result was obtained on the submitted specimen  and confirmed on repeat testing.  While 2019 novel coronavirus  (SARS-CoV-2) nucleic acids may be present in the submitted sample  additional confirmatory testing may be necessary for epidemiological  and / or clinical management purposes  to differentiate between  SARS-CoV-2 and other Sarbecovirus currently known to infect humans.  If clinically indicated additional testing with an alternate test  methodology [phone]) is advised. The SARS-CoV-2 RNA is generally  detectable in upper and lower respiratory sp ecimens during the acute  phase of infection. The expected result is Negative. Fact Sheet for Patients:  StrictlyIdeas.no Fact Sheet for Healthcare Providers: BankingDealers.co.za This test is not yet approved or cleared by the [place] FDA and has been authorized for detection and/or diagnosis of SARS-CoV-2 by FDA under an Emergency Use Authorization (EUA).  This EUA will remain in effect (meaning this test can be used) for the duration of the COVID-19 declaration under  Section 564(b)(1) of the Act, 21 U.S.C. section 360bbb-3(b)(1), unless the authorization is terminated or revoked sooner. Performed at [facility], [place] [address], [place], [place] [address]     Radiology Reports US Renal  Result Date: 11/16/2018 CLINICAL DATA:  AKI with elevated BUN and creatinine EXAM: RENAL / URINARY TRACT ULTRASOUND COMPLETE COMPARISON:  Ultrasound  03/10/2018 FINDINGS: Right Kidney: Renal measurements: 10.3 x 4.6 x 4.7 cm = volume: 114 mL . Echogenicity within normal limits. No mass or hydronephrosis visualized. Left Kidney: Renal measurements: 9.4 x 5.6 x 4.6 cm = volume: 125 mL. Echogenicity within normal limits. No mass or hydronephrosis visualized. Previously seen anechoic cyst in the interpolar region is no longer visualized. Bladder: Appears normal for degree of bladder distention. Other: Bilateral pleural effusions. IMPRESSION: Incidentally noted bilateral pleural effusions. Previously seen left renal cyst no longer visualized. Otherwise unremarkable renal ultrasound. Electronically Signed   By: Lovena Le M.D.   On: 11/16/2018 02:21   Ir Fluoro Guide Cv Line Right  Result Date: 11/17/2018 CLINICAL DATA:  Acute on chronic kidney disease EXAM: EXAM RIGHT IJ CATHETER PLACEMENT UNDER ULTRASOUND AND FLUOROSCOPIC GUIDANCE TECHNIQUE: The procedure, risks (including but not limited to bleeding, infection, organ damage, pneumothorax), benefits, and alternatives were explained to the patient. Questions regarding the procedure were encouraged and answered. The patient understands and consents to the procedure. Patency of the right IJ vein was confirmed with ultrasound with image documentation. An appropriate skin site was determined. Skin site was marked. Region was prepped using maximum barrier technique including cap and mask, sterile gown, sterile gloves, large sterile sheet, and Chlorhexidine as cutaneous antisepsis. The region was infiltrated locally with 1% lidocaine.  Under real-time ultrasound guidance, the right IJ vein was accessed with a 19 gauge needle; the needle tip within the vein was confirmed with ultrasound image documentation. The needle exchanged over a guidewire for vascular dilator which allowed advancement of a 16 cm Trialysis catheter. This was positioned with the tip at the cavoatrial junction. Spot chest radiograph shows good positioning and no pneumothorax. Catheter was flushed and sutured externally with 0-Prolene sutures. Patient tolerated the procedure well. FLUOROSCOPY TIME:  26 seconds, 1 image was saved. COMPLICATIONS: COMPLICATIONS none IMPRESSION: Technically successful right IJ Trialysis catheter placement. The catheter is ready for immediate use as clinically indicated. Electronically Signed   By: Constance Holster M.D.   On: 11/17/2018 08:04   Ir US Guide Vasc Access Right  Result Date: 11/17/2018 CLINICAL DATA:  Acute on chronic kidney disease EXAM: EXAM RIGHT IJ CATHETER PLACEMENT UNDER ULTRASOUND AND FLUOROSCOPIC GUIDANCE TECHNIQUE: The procedure, risks (including but not limited to bleeding, infection, organ damage, pneumothorax), benefits, and alternatives were explained to the patient. Questions regarding the procedure were encouraged and answered. The patient understands and consents to the procedure. Patency of the right IJ vein was confirmed with ultrasound with image documentation. An appropriate skin site was determined. Skin site was marked. Region was prepped using maximum barrier technique including cap and mask, sterile gown, sterile gloves, large sterile sheet, and Chlorhexidine as cutaneous antisepsis. The region was infiltrated locally with 1% lidocaine. Under real-time ultrasound guidance, the right IJ vein was accessed with a 19 gauge needle; the needle tip within the vein was confirmed with ultrasound image documentation. The needle exchanged over a guidewire for vascular dilator which allowed advancement of a 16 cm  Trialysis catheter. This was positioned with the tip at the cavoatrial junction. Spot chest radiograph shows good positioning and no pneumothorax. Catheter was flushed and sutured externally with 0-Prolene sutures. Patient tolerated the procedure well. FLUOROSCOPY TIME:  26 seconds, 1 image was saved. COMPLICATIONS: COMPLICATIONS none IMPRESSION: Technically successful right IJ Trialysis catheter placement. The catheter is ready for immediate use as clinically indicated. Electronically Signed   By: Constance Holster M.D.   On: 11/17/2018 08:04   Dg  Chest Port 1 View  Result Date: 11/15/2018 CLINICAL DATA:  Shortness of breath EXAM: PORTABLE CHEST 1 VIEW COMPARISON:  03/10/2018 FINDINGS: Cardiac shadow remains mildly enlarged. Diffuse vascular congestion is noted with interstitial edema and bilateral effusions. Bibasilar atelectasis is noted as well. IMPRESSION: Changes consistent with CHF with bibasilar atelectasis. Electronically Signed   By: Inez Catalina M.D.   On: 11/15/2018 23:20   Vas [place] Upper Ext Vein Mapping (pre-op Avf)  Result Date: 11/16/2018 UPPER EXTREMITY VEIN MAPPING  Indications: Pre-access. Comparison Study: no prior Performing Technologist: Abram Sander RVS  Examination Guidelines: A complete evaluation includes B-mode imaging, spectral Doppler, color Doppler, and power Doppler as needed of all accessible portions of each vessel. Bilateral testing is considered an integral part of a complete examination. Limited examinations for reoccurring indications may be performed as noted. +-----------------+-------------+----------+--------+  Right Cephalic    Diameter (cm) Depth (cm) Findings  +-----------------+-------------+----------+--------+  Shoulder              0.63         0.62              +-----------------+-------------+----------+--------+  Prox upper arm        0.59         0.51              +-----------------+-------------+----------+--------+  Mid upper arm         0.50         0.26               +-----------------+-------------+----------+--------+  Dist upper arm        0.47         0.28              +-----------------+-------------+----------+--------+  Antecubital fossa     0.64         0.38              +-----------------+-------------+----------+--------+  Prox forearm          0.50         0.35              +-----------------+-------------+----------+--------+  Mid forearm           0.37         0.26              +-----------------+-------------+----------+--------+  Dist forearm          0.32         0.19              +-----------------+-------------+----------+--------+ +-----------------+-------------+----------+--------+  Right Basilic     Diameter (cm) Depth (cm) Findings  +-----------------+-------------+----------+--------+  Prox upper arm        0.41         1.43              +-----------------+-------------+----------+--------+  Mid upper arm         0.51         1.24              +-----------------+-------------+----------+--------+  Dist upper arm        0.54         0.63              +-----------------+-------------+----------+--------+  Antecubital fossa     0.65         0.59              +-----------------+-------------+----------+--------+  Prox forearm  0.26         0.23              +-----------------+-------------+----------+--------+  Mid forearm           0.24         0.22              +-----------------+-------------+----------+--------+  Distal forearm        0.27         0.26              +-----------------+-------------+----------+--------+ +-----------------+-------------+----------+--------------+  Left Cephalic     Diameter (cm) Depth (cm)    Findings     +-----------------+-------------+----------+--------------+  Shoulder              0.31         0.39      branching     +-----------------+-------------+----------+--------------+  Prox upper arm        0.12         0.21      branching     +-----------------+-------------+----------+--------------+  Mid  upper arm                              not visualized  +-----------------+-------------+----------+--------------+  Dist upper arm                             not visualized  +-----------------+-------------+----------+--------------+  Antecubital fossa                          not visualized  +-----------------+-------------+----------+--------------+  Prox forearm                               not visualized  +-----------------+-------------+----------+--------------+  Mid forearm                                not visualized  +-----------------+-------------+----------+--------------+  Dist forearm                               not visualized  +-----------------+-------------+----------+--------------+  Wrist                                      not visualized  +-----------------+-------------+----------+--------------+ +-----------------+-------------+----------+--------+  Left Basilic      Diameter (cm) Depth (cm) Findings  +-----------------+-------------+----------+--------+  Prox upper arm        0.91         1.10              +-----------------+-------------+----------+--------+  Mid upper arm         0.77         1.27              +-----------------+-------------+----------+--------+  Dist upper arm        0.64         0.50              +-----------------+-------------+----------+--------+  Antecubital fossa     0.76         0.59              +-----------------+-------------+----------+--------+  Prox  forearm          0.17         0.18              +-----------------+-------------+----------+--------+  Mid forearm           0.20         0.20              +-----------------+-------------+----------+--------+  Distal forearm        0.21         0.19              +-----------------+-------------+----------+--------+ *See table(s) above for measurements and observations.  Diagnosing physician: Deitra Mayo MD Electronically signed by Deitra Mayo MD on 11/16/2018 at 2:52:19 PM.    Final      Time Spent in minutes  30   Lala Lund M.D on 11/21/2018 at 9:53 AM  To page go to [email or web address] - password [facility]

## 2018-11-21 NOTE — Progress Notes (Addendum)
[employer] interpreter Elita Quick # [phone] Elita Quick for shift assessment.  Observed swollen right hand, some pain with movement, no numbness nor tingling, pt. stated hurt worse earlier in the day. Tender at site of prior forearm IV. Firm/taut skin at site of prior IV. Patient stated had pain after labs drawn, labs prev drawn evening of 9/19 for post H&H. Informed charge RN, IV team consulted to assess site.   IV team nurse stated no evidence of phlebitis. Likely inflitration and to offer warm compress.  During RN rounds at 0047, patient sitting up on sid eof bed, patient's daughter at bedside indicated patient feeling hot inside his body. VS assessed and interpreter [place] [phone] used at 0055. Patient stated feels hot inside his body all over, but not on outside. Complained of discomfort in his neck at catheter site, but no complaints of pain. Upon inquiry patient stated felt this way previously (hot feeling), however, feeling went away after receiving blood transfusion.   Paged NP at 0137, informed patient may need CBC lab and to please callback. 0141 spoke with Bodenheimer via phone and informed of no tele orders, arm tenderness and hand swelling post IV removal, IV team assessed site and indicated infiltration and patient feeling hot all over body as he did prior to transfusion. Will order CBC   Warm compress applied to IV site  0450 reassessed right forearm, patient still complain of pain at site, less swelling, however, skin  firm/taut at site of IV removal  Paged Bodenheimer at 0526. Informed Na+ 127, Hgb 8.8,BUN 53 and Cr. 7.61. Right forearm still taut/firm/tender at prior IV site.  Interpreter # [phone] [place] at [phone], no longer has hot feeling in body thinks he may have been too hot with blankets before. Still has a little pain at site with palpation. Area still firm/taut and hand swollen (non-pitting), but slightly less swelling than previous assessment.   Report given to HD Dain at [phone]. Left floor for  HD at approx [phone]  Informed day RN of patient's complaint of pain at IV site.   Received call from OR charge RN approx [phone] inquiring about taking patient to surgery earlier since change in OR schedule. Informed RN patient leaving for HD within next few minutes and to keep surgery time as scheduled.

## 2018-11-22 ENCOUNTER — Inpatient Hospital Stay (HOSPITAL_COMMUNITY): Payer: Medicaid Other | Admitting: Certified Registered Nurse Anesthetist

## 2018-11-22 ENCOUNTER — Inpatient Hospital Stay (HOSPITAL_COMMUNITY): Payer: Medicaid Other

## 2018-11-22 ENCOUNTER — Encounter (HOSPITAL_COMMUNITY)
Admission: EM | Disposition: A | Discharge status: Home / Self Care | Payer: Self-pay | Source: Home / Self Care | Attending: Internal Medicine

## 2018-11-22 ENCOUNTER — Encounter (HOSPITAL_COMMUNITY): Payer: Self-pay | Admitting: Orthopedic Surgery

## 2018-11-22 DIAGNOSIS — N185 Chronic kidney disease, stage 5: Secondary | ICD-10-CM

## 2018-11-22 HISTORY — PX: AV FISTULA PLACEMENT: SHX1204

## 2018-11-22 HISTORY — PX: INSERTION OF DIALYSIS CATHETER: SHX1324

## 2018-11-22 LAB — POCT I-STAT 4, (NA,K, GLUC, HGB,HCT)
Glucose, Bld: 113 mg/dL — ABNORMAL HIGH (ref 70–99)
HCT: 26 % — ABNORMAL LOW (ref 39.0–52.0)
Hemoglobin: 8.8 g/dL — ABNORMAL LOW (ref 13.0–17.0)
Potassium: 3.2 mmol/L — ABNORMAL LOW (ref 3.5–5.1)
Sodium: 134 mmol/L — ABNORMAL LOW (ref 135–145)

## 2018-11-22 LAB — CBC
HCT: 25.7 % — ABNORMAL LOW (ref 39.0–52.0)
HCT: 26 % — ABNORMAL LOW (ref 39.0–52.0)
Hemoglobin: 8.8 g/dL — ABNORMAL LOW (ref 13.0–17.0)
Hemoglobin: 8.9 g/dL — ABNORMAL LOW (ref 13.0–17.0)
MCH: 29.5 pg (ref 26.0–34.0)
MCH: 29.9 pg (ref 26.0–34.0)
MCHC: 34.2 g/dL (ref 30.0–36.0)
MCHC: 34.2 g/dL (ref 30.0–36.0)
MCV: 86.2 fL (ref 80.0–100.0)
MCV: 87.2 fL (ref 80.0–100.0)
Platelets: 180 10*3/uL (ref 150–400)
Platelets: 178 10*3/uL (ref 150–400)
RBC: 2.98 MIL/uL — ABNORMAL LOW (ref 4.22–5.81)
RBC: 2.98 MIL/uL — ABNORMAL LOW (ref 4.22–5.81)
RDW: 12.4 % (ref 11.5–15.5)
RDW: 12.4 % (ref 11.5–15.5)
WBC: 6.2 10*3/uL (ref 4.0–10.5)
WBC: 4.8 10*3/uL (ref 4.0–10.5)
nRBC: 0 % (ref 0.0–0.2)
nRBC: 0 % (ref 0.0–0.2)

## 2018-11-22 LAB — BASIC METABOLIC PANEL
Anion gap: 12 (ref 5–15)
BUN: 53 mg/dL — ABNORMAL HIGH (ref 6–20)
CO2: 22 mmol/L (ref 22–32)
Calcium: 7.6 mg/dL — ABNORMAL LOW (ref 8.9–10.3)
Chloride: 93 mmol/L — ABNORMAL LOW (ref 98–111)
Creatinine, Ser: 7.61 mg/dL — ABNORMAL HIGH (ref 0.61–1.24)
GFR calc Af Amer: 9 mL/min — ABNORMAL LOW (ref >60)
GFR calc non Af Amer: 7 mL/min — ABNORMAL LOW (ref >60)
Glucose, Bld: 120 mg/dL — ABNORMAL HIGH (ref 70–99)
Potassium: 4.6 mmol/L (ref 3.5–5.1)
Sodium: 127 mmol/L — ABNORMAL LOW (ref 135–145)

## 2018-11-22 LAB — GLUCOSE, CAPILLARY
Glucose-Capillary: 118 mg/dL — ABNORMAL HIGH (ref 70–99)
Glucose-Capillary: 102 mg/dL — ABNORMAL HIGH (ref 70–99)
Glucose-Capillary: 140 mg/dL — ABNORMAL HIGH (ref 70–99)
Glucose-Capillary: 123 mg/dL — ABNORMAL HIGH (ref 70–99)
Glucose-Capillary: 171 mg/dL — ABNORMAL HIGH (ref 70–99)

## 2018-11-22 SURGERY — ARTERIOVENOUS (AV) FISTULA CREATION
Anesthesia: Monitor Anesthesia Care | Site: Neck | Laterality: Right

## 2018-11-22 MED ORDER — SODIUM CHLORIDE 0.9 % IV SOLN
INTRAVENOUS | Status: DC | PRN
  Administered 2018-11-22: 500 mL

## 2018-11-22 MED ORDER — LIDOCAINE 2% (20 MG/ML) 5 ML SYRINGE
INTRAMUSCULAR | Status: AC
  Filled 2018-11-22: qty 5

## 2018-11-22 MED ORDER — PROPOFOL 1000 MG/100ML IV EMUL
INTRAVENOUS | Status: AC
  Filled 2018-11-22: qty 100

## 2018-11-22 MED ORDER — SODIUM CHLORIDE 0.9 % IV SOLN
INTRAVENOUS | Status: DC
  Administered 2018-11-22: 12:00:00 via INTRAVENOUS

## 2018-11-22 MED ORDER — LIDOCAINE-EPINEPHRINE 0.5 %-1:200000 IJ SOLN
INTRAMUSCULAR | Status: DC | PRN
  Administered 2018-11-22: 11 mL

## 2018-11-22 MED ORDER — MIDAZOLAM HCL 2 MG/2ML IJ SOLN: 0.5000 mg | Freq: Once | INTRAMUSCULAR | Status: DC | PRN

## 2018-11-22 MED ORDER — FENTANYL CITRATE (PF) 250 MCG/5ML IJ SOLN
INTRAMUSCULAR | Status: DC | PRN
  Administered 2018-11-22 (×3): 25 ug via INTRAVENOUS

## 2018-11-22 MED ORDER — FENTANYL CITRATE (PF) 250 MCG/5ML IJ SOLN
INTRAMUSCULAR | Status: AC
  Filled 2018-11-22: qty 5

## 2018-11-22 MED ORDER — PROPOFOL 500 MG/50ML IV EMUL
INTRAVENOUS | Status: DC | PRN
  Administered 2018-11-22: 75 ug/kg/min via INTRAVENOUS

## 2018-11-22 MED ORDER — LIDOCAINE-EPINEPHRINE 0.5 %-1:200000 IJ SOLN
INTRAMUSCULAR | Status: AC
  Filled 2018-11-22: qty 1

## 2018-11-22 MED ORDER — MEPERIDINE HCL 25 MG/ML IJ SOLN: 6.2500 mg | INTRAMUSCULAR | Status: DC | PRN

## 2018-11-22 MED ORDER — PROPOFOL 10 MG/ML IV BOLUS
INTRAVENOUS | Status: AC
  Filled 2018-11-22: qty 20

## 2018-11-22 MED ORDER — MIDAZOLAM HCL 2 MG/2ML IJ SOLN
INTRAMUSCULAR | Status: DC | PRN
  Administered 2018-11-22: 0.5 mg via INTRAVENOUS
  Administered 2018-11-22: 1 mg via INTRAVENOUS

## 2018-11-22 MED ORDER — HEPARIN SODIUM (PORCINE) 1000 UNIT/ML IJ SOLN
INTRAMUSCULAR | Status: DC | PRN
  Administered 2018-11-22: 3400 [IU] via INTRAVENOUS

## 2018-11-22 MED ORDER — PROPOFOL 10 MG/ML IV BOLUS
INTRAVENOUS | Status: DC | PRN
  Administered 2018-11-22 (×3): 20 mg via INTRAVENOUS

## 2018-11-22 MED ORDER — HEPARIN SODIUM (PORCINE) 1000 UNIT/ML IJ SOLN
INTRAMUSCULAR | Status: AC
  Administered 2018-11-22: 2600 [IU] via INTRAVENOUS_CENTRAL
  Filled 2018-11-22: qty 3

## 2018-11-22 MED ORDER — SODIUM CHLORIDE 0.9 % IV SOLN
INTRAVENOUS | Status: AC
  Filled 2018-11-22: qty 1.2

## 2018-11-22 MED ORDER — FENTANYL CITRATE (PF) 100 MCG/2ML IJ SOLN: 25.0000 ug | INTRAMUSCULAR | Status: DC | PRN

## 2018-11-22 MED ORDER — PROPOFOL 1000 MG/100ML IV EMUL
INTRAVENOUS | Status: AC
  Filled 2018-11-22: qty 200

## 2018-11-22 MED ORDER — 0.9 % SODIUM CHLORIDE (POUR BTL) OPTIME
TOPICAL | Status: DC | PRN
  Administered 2018-11-22: 14:00:00 1000 mL

## 2018-11-22 MED ORDER — MIDAZOLAM HCL 2 MG/2ML IJ SOLN
INTRAMUSCULAR | Status: AC
  Filled 2018-11-22: qty 2

## 2018-11-22 SURGICAL SUPPLY — 51 items
ARMBAND PINK RESTRICT EXTREMIT (MISCELLANEOUS) ×5 IMPLANT
BAG DECANTER FOR FLEXI CONT (MISCELLANEOUS) ×2 IMPLANT
BIOPATCH RED 1 DISK 7.0 (GAUZE/BANDAGES/DRESSINGS) ×3 IMPLANT
CANISTER SUCT 3000ML PPV (MISCELLANEOUS) ×3 IMPLANT
CANNULA VESSEL 3MM 2 BLNT TIP (CANNULA) ×3 IMPLANT
CATH PALINDROME RT-P 15FX19CM (CATHETERS) IMPLANT
CATH PALINDROME RT-P 15FX23CM (CATHETERS) ×1 IMPLANT
CATH PALINDROME RT-P 15FX28CM (CATHETERS) IMPLANT
CATH PALINDROME RT-P 15FX55CM (CATHETERS) IMPLANT
CLIP LIGATING EXTRA MED SLVR (CLIP) ×3 IMPLANT
CLIP LIGATING EXTRA SM BLUE (MISCELLANEOUS) ×3 IMPLANT
COVER PROBE W GEL 5X96 (DRAPES) ×3 IMPLANT
COVER SURGICAL LIGHT HANDLE (MISCELLANEOUS) ×3 IMPLANT
COVER WAND RF STERILE (DRAPES) ×2 IMPLANT
DECANTER SPIKE VIAL GLASS SM (MISCELLANEOUS) ×3 IMPLANT
DERMABOND ADVANCED (GAUZE/BANDAGES/DRESSINGS) ×1
DERMABOND ADVANCED .7 DNX12 (GAUZE/BANDAGES/DRESSINGS) ×2 IMPLANT
DRAPE C-ARM 42X72 X-RAY (DRAPES) ×3 IMPLANT
DRAPE CHEST BREAST 15X10 FENES (DRAPES) ×3 IMPLANT
ELECT REM PT RETURN 9FT ADLT (ELECTROSURGICAL) ×3
ELECTRODE REM PT RTRN 9FT ADLT (ELECTROSURGICAL) ×2 IMPLANT
GAUZE 4X4 16PLY RFD (DISPOSABLE) ×3 IMPLANT
GLOVE SS BIOGEL STRL SZ 7.5 (GLOVE) ×2 IMPLANT
GLOVE SUPERSENSE BIOGEL SZ 7.5 (GLOVE) ×1
GOWN STRL REUS W/ TWL LRG LVL3 (GOWN DISPOSABLE) ×6 IMPLANT
GOWN STRL REUS W/TWL LRG LVL3 (GOWN DISPOSABLE) ×5
KIT BASIN OR (CUSTOM PROCEDURE TRAY) ×3 IMPLANT
KIT TURNOVER KIT B (KITS) ×3 IMPLANT
NDL 18GX1X1/2 (RX/OR ONLY) (NEEDLE) ×2 IMPLANT
NDL HYPO 25GX1X1/2 BEV (NEEDLE) ×2 IMPLANT
NEEDLE 18GX1X1/2 (RX/OR ONLY) (NEEDLE) ×3 IMPLANT
NEEDLE 22X1 1/2 (OR ONLY) (NEEDLE) IMPLANT
NEEDLE HYPO 25GX1X1/2 BEV (NEEDLE) IMPLANT
NS IRRIG 1000ML POUR BTL (IV SOLUTION) ×3 IMPLANT
PACK CV ACCESS (CUSTOM PROCEDURE TRAY) ×3 IMPLANT
PACK SURGICAL SETUP 50X90 (CUSTOM PROCEDURE TRAY) ×3 IMPLANT
PAD ARMBOARD 7.5X6 YLW CONV (MISCELLANEOUS) ×6 IMPLANT
SOAP 2 % CHG 4 OZ (WOUND CARE) ×3 IMPLANT
SUT ETHILON 3 0 PS 1 (SUTURE) ×3 IMPLANT
SUT PROLENE 6 0 CC (SUTURE) ×5 IMPLANT
SUT VIC AB 3-0 SH 27 (SUTURE) ×1
SUT VIC AB 3-0 SH 27X BRD (SUTURE) ×2 IMPLANT
SUT VICRYL 4-0 PS2 18IN ABS (SUTURE) ×3 IMPLANT
SYR 10ML LL (SYRINGE) ×3 IMPLANT
SYR 20ML LL LF (SYRINGE) ×3 IMPLANT
SYR 5ML LL (SYRINGE) ×6 IMPLANT
SYR CONTROL 10ML LL (SYRINGE) ×2 IMPLANT
TOWEL GREEN STERILE (TOWEL DISPOSABLE) ×6 IMPLANT
TOWEL GREEN STERILE FF (TOWEL DISPOSABLE) ×3 IMPLANT
UNDERPAD 30X30 (UNDERPADS AND DIAPERS) ×3 IMPLANT
WATER STERILE IRR 1000ML POUR (IV SOLUTION) ×3 IMPLANT

## 2018-11-22 NOTE — Progress Notes (Signed)
[facility] Progress Note    Assessment/ Plan:   1.  Acute Hypoxic respiratory failure: Requiring HFNC O2--> with CXR c/w pulmonary edema in setting of worsening edema.   This has improved since starting dialysis and he's on RA now.    2.  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 Renal US was normal in 03/2018 and without obstruction now. HD ordered as above.  Will need perm access and CLIP--> VVS to place perm access planned for 9/21. On MWF schedule   - Seen on HD today 157/79 2.5L (net 2L) tolerating 2/2.5 RIJ temp  - Next HD Wed.  3. Anemia: Suspect anemia of CKD. Iron deficient- ferrlicet with HD  4 HTN: Markedly hypertensive at admission. Now better.  Resume home meds, BP better since beginning HD  5 BMM: Phos up (8 on 9/15), will begin sevelamer 800 TID AC.  PTH OK   6 DM: Recent A1cs controlled; care per primary.   7. Elevated trop: 25--> 168--> 525 now, then flattened out.  Per primary   8.  Dispo: pending CLIP and perm access  Subjective:   Denies f/c/n/v. + constipation.   Objective:   BP (!) 157/79   Pulse 76   Temp 98.6 F (37 C) (Oral)   Resp 16   Ht 5\' 6"  (1.676 m)   Wt 79.6 kg   SpO2 96%   BMI 28.32 kg/m   Intake/Output Summary (Last 24 hours) at 11/22/2018 0919 Last data filed at 11/21/2018 [id number] Gross per 24 hour  Intake 480 ml  Output -  Net 480 ml   Weight change: 0.7 kg  Physical Exam: Gen: older gentleman, NAD CVS: RRR no m/r/g Resp: on RA now Abd: abd fullness better Ext: no LE edema Access: RIJ temp  Imaging: No results found.  Labs: BMET Recent Labs  Lab 11/16/18 1315 11/17/18 0340 11/18/18 0239 11/19/18 0234 11/20/18 0322 11/21/18 0325 11/22/18 0309  NA 132* 133* 133* 131* 131* 130* 127*  K 4.8 4.9 3.5 4.0 4.1 4.5 4.6  CL 102 101 98 96* 95* 94* 93*  CO2 15* 14* 22 23 23 23 22   GLUCOSE 101* 97  113* 152* 127* 118* 120*  BUN 116* 116* 40* 50* 30* 41* 53*  CREATININE 11.84* 12.60* 5.82* 7.52* 5.53* 6.79* 7.61*  CALCIUM 7.0* 7.0* 7.7* 7.6* 7.5* 7.8* 7.6*  PHOS 8.0*  --   --   --   --   --   --    CBC Recent Labs  Lab 11/15/18 2304 11/17/18 2201 11/17/18 2320 11/20/18 0322 11/20/18 1902 11/22/18 0309  WBC 5.4 5.5  --  4.7  --  6.2  NEUTROABS 3.7  --   --   --   --   --   HGB 8.3* 7.1* 7.3* 7.3* 8.2* 8.8*  HCT 24.2* 20.9* 21.3* 23.3* 25.1* 25.7*  MCV 87.1 86.4  --  88.6  --  86.2  PLT 227 190  --  188  --  180    Medications:    . amLODipine  10 mg Oral Daily  . aspirin EC  81 mg Oral Daily  . atorvastatin  40 mg Oral q1800  . Chlorhexidine Gluconate Cloth  6 each Topical Q0600  . heparin injection (subcutaneous)  5,000 Units Subcutaneous Q8H  . hydrALAZINE  50 mg Oral Q8H  . insulin aspart  0-5 Units Subcutaneous QHS  .  insulin aspart  0-9 Units Subcutaneous TID WC  . labetalol  100 mg Oral Daily  . sevelamer carbonate  800 mg Oral TID WC      Otelia Santee, MD 11/22/2018, 9:19 AM

## 2018-11-22 NOTE — Anesthesia Preprocedure Evaluation (Addendum)
Anesthesia Evaluation  Patient identified by MRN, date of birth, ID band Patient awake    Reviewed: Allergy & Precautions, NPO status , Patient's Chart, lab work & pertinent test results  History of Anesthesia Complications Negative for: history of anesthetic complications  Airway Mallampati: IV  TM Distance: >3 FB Neck ROM: Full    Dental  (+) Dental Advisory Given   Pulmonary former smoker,  11/15/2018 SARS coronavirus NEG   breath sounds clear to auscultation       Cardiovascular hypertension, Pt. on medications (-) angina+CHF (on admission)   Rhythm:Regular Rate:Normal  11/17/2018 ECHO: EF 60-65%, mild AI, mod pericardial effusion   Neuro/Psych CVA, No Residual Symptoms    GI/Hepatic negative GI ROS,   Endo/Other  diabetes (glu 102), Oral Hypoglycemic Agents  Renal/GU ESRFRenal disease (K+ 3.2)     Musculoskeletal   Abdominal (+) - obese,   Peds  Hematology  (+) Blood dyscrasia (Hb 8.8), anemia ,   Anesthesia Other Findings   Reproductive/Obstetrics                            Anesthesia Physical Anesthesia Plan  ASA: III  Anesthesia Plan: MAC   Post-op Pain Management:    Induction:   PONV Risk Score and Plan: 1 and Treatment may vary due to age or medical condition  Airway Management Planned: Natural Airway and Simple Face Mask  Additional Equipment:   Intra-op Plan:   Post-operative Plan:   Informed Consent: I have reviewed the patients History and Physical, chart, labs and discussed the procedure including the risks, benefits and alternatives for the proposed anesthesia with the patient or authorized representative who has indicated his/her understanding and acceptance.     Dental advisory given  Plan Discussed with: CRNA and Surgeon  Anesthesia Plan Comments: (Used translation services)       Anesthesia Quick Evaluation

## 2018-11-22 NOTE — Op Note (Signed)
    OPERATIVE REPORT  DATE OF SURGERY: 11/22/2018  PATIENT: Max Sanchez, 53 y.o. male MRN: [id number]  DOB: March 12, 1965  PRE-OPERATIVE DIAGNOSIS: End-stage renal disease  POST-OPERATIVE DIAGNOSIS:  Same  PROCEDURE: #1 exchange of right IJ tunneled catheter for temporary catheter, #2 left radiocephalic AV fistula creation  SURGEON:  Curt Jews, M.D.  PHYSICIAN ASSISTANT: Laurence Slate, PA-C  ANESTHESIA: Local with sedation  EBL: per anesthesia record  Total I/O In: 300 [I.V.:300] Out: 2005 [Other:2000; Blood:5]  BLOOD ADMINISTERED: none  DRAINS: none  SPECIMEN: none  COUNTS CORRECT:  YES  PATIENT DISPOSITION:  PACU - hemodynamically stable  PROCEDURE DETAILS: Patient was taken the operating placed supine position where the prior existing temporary catheter in the right internal jugular vein and chest were prepped and draped in usual sterile fashion.  A guidewire was passed through the existing catheter and was positioned down to the level of the right atrium.  This was confirmed with fluoroscopy.  The existing catheter was removed.  A dilator and peel-away sheath was passed over the guidewire and the dilator and guidewire were removed.  A 23 cm catheter was positioned the level of the distal right atrium.  The peel-away sheath was removed.  The catheter was brought through a subcutaneous tunnel through a separate incision and the 2 lm ports were attached.  Both lumens flushed and aspirated easily and were locked with 1000 unit/cc heparin.  The catheter was secured to the skin with a 3-0 nylon stitch and the entry site was closed with a 4-0 subcuticular Vicryl stitch.  Attention was then turned to the left arm.  The patient had an easily visible cephalic vein on the wrist and forearm.  Patient had had a IV in this vein.  The vein was imaged with SonoSite ultrasound and there was no thrombus present in the cephalic vein.  The left arm and hand were prepped and draped in usual  sterile fashion.  An incision was made between the level of the radial artery and cephalic vein at the wrist.  The vein was mobilized proximally and distally and was ligated distally and divided.  The vein was gently dilated and was of excellent size.  Patient did have a large size branch going laterally towards the dorsum of the hand and this was doubly clipped.  The radial artery was exposed and was a very calcified but did have a good pulse.  The artery was occluded proximally and distally was opened with an 11 blade and sent longstanding with Potts scissors.  The vein was cut to the appropriate length and spatulated and sewn end-to-side to the artery with a running 6-0 Prolene suture.  Clamps removed and excellent thrill was noted in the fistula.  Wounds irrigated with saline.  Hemostasis to electrocautery.  Wounds were closed with 3-0 Vicryl in the subcutaneous and subcuticular tissue.  Patient was transferred to the recovery room with chest x-ray pending   Max Sanchez, M.D., [facility] 11/22/2018 3:34 PM

## 2018-11-22 NOTE — Progress Notes (Addendum)
PROGRESS NOTE                                                                                                                                                                                                             Patient Demographics:    Max Sanchez, is a 53 y.o. male, DOB - Jun 16, 1965, SA:[id number]  Admit date - 11/15/2018   Admitting Physician Ivor Costa, MD  Outpatient Primary MD for the patient is Scot Jun, FNP  LOS - 6  Chief Complaint  Patient presents with   Respiratory Distress   Shortness of Breath       Brief Narrative  Max Sanchez is a 53 y.o. male with medical history significant of HTN, HLD, DM, h/o CVA (03/2018), CKD-IV, anemia, dCHF,who presents with shortness of breath.  In the ER he was diagnosed with acute on chronic CHF due to fluid overload caused by renal failure.  Nephrology was consulted and we were requested to admit the patient.    Subjective:   Patient in bed getting dialyzed, denies any headache chest or abdominal pain.  No shortness of breath.   Assessment  & Plan :     1.  Acute hypoxic respiratory failure due to fluid overload caused by ESRD intern causing acute on chronic diastolic CHF recent echo in January showed EF of 60%.  Seen by nephrology, did not respond to diuretics, dialysis started on 11/16/2018 with good effect shortness of breath has improved, noted with a preserved EF of 60% and possible chronic diastolic CHF.  Currently close to being compensated.  He is due for AV fistula placement by vascular surgery on 11/22/2018.  2.  Hypertension.  Currently on combination of Norvasc, hydralazine and beta-blocker.  Blood pressure stable now.  Home dose beta-blocker has been cut in half.  3.  AKI on CKD 5 now progressed to ESRD.  Nephrology consulted, underwent right IJ dialysis catheter placement, dialysis was started on 11/16/2018 evening.  Discussed plan of care with nephrologist Dr. Hollie Salk on 11/19/2018, he is  likely to get an outpatient dialysis spot coming Tuesday.  Also plan for fistula placement on coming Monday.  4.  Dyslipidemia.  Continue home dose statin.  5.  History of stroke.  Continue aspirin and statin for secondary prevention.    6.  Troponin elevation.  In non-ACS pattern due to stress caused by acute hypoxic respiratory failure and CHF, in the setting of ESRD.  Trend is flat, chest pain-free, EKG nonacute. Continue aspirin,  beta-blocker and statin.  Echo noted with preserved EF and no wall motion abnormality.  7.  Anemia of chronic disease.  Received 1 unit of packed RBC on 11/20/2018.  Stable.  8. DM type II.  On sliding scale.  CBG (last 3)  Recent Labs    11/21/18 1637 11/21/18 2157 11/22/18 0626  GLUCAP 151* 221* 118*    Lab Results  Component Value Date   HGBA1C 6.0 (H) 11/16/2018     Family Communication  : Daughter bedside 9/15, 9/16, 9/19  Code Status : Full  Disposition Plan  : MedSurg, likely discharge home on 11/23/2018 if AV fistula is placed and he has an outpatient dialysis appointment set.  Consults  : Nephrology, vascular surgery  Procedures  :    On 11/22/2018 Dr Early VVS .   #1 exchange of right IJ tunneled catheter for temporary catheter, #2 left radiocephalic AV fistula creation  R.IJ HD cath - 9/15  TTE -   1. The left ventricle has normal systolic function with an ejection fraction of 60-65%. The cavity size was mildly dilated. Left ventricular diastolic Doppler parameters are consistent with pseudonormalization. Elevated left ventricular end-diastolic pressure.  2. The right ventricle has normal systolic function. The cavity was normal. There is No increase in right ventricular wall thickness.  3. Left atrial size was severely dilated.  4. Right atrial size was normal.  5. Moderate pericardial effusion.  6. No evidence of mitral valve stenosis.  7. The aortic valve is tricuspid. Aortic valve regurgitation is mild by color flow Doppler.  No stenosis of the aortic valve.  8. Pulmonic valve regurgitation is mild by color flow Doppler.  9. The aorta is normal unless otherwise noted. 10. The inferior vena cava was is dilated in size with >50% respiratory variability, suggesting right atrial pressure of 8 mmHg.  DVT Prophylaxis  :    Heparin added  Lab Results  Component Value Date   PLT 180 11/22/2018    Diet :  Diet Order            Diet NPO time specified Except for: Sips with Meds  Diet effective midnight               Inpatient Medications Scheduled Meds:  amLODipine  10 mg Oral Daily   aspirin EC  81 mg Oral Daily   atorvastatin  40 mg Oral q1800   Chlorhexidine Gluconate Cloth  6 each Topical Q0600   heparin injection (subcutaneous)  5,000 Units Subcutaneous Q8H   hydrALAZINE  50 mg Oral Q8H   insulin aspart  0-5 Units Subcutaneous QHS   insulin aspart  0-9 Units Subcutaneous TID WC   labetalol  100 mg Oral Daily   sevelamer carbonate  800 mg Oral TID WC   Continuous Infusions:  sodium chloride     sodium chloride     cefUROXime (ZINACEF)  IV     ferric gluconate (FERRLECIT/NULECIT) IV Stopped (11/20/18 1343)   PRN Meds:.sodium chloride, sodium chloride, acetaminophen, alteplase, heparin, hydrALAZINE, lidocaine (PF), lidocaine-prilocaine, morphine injection, nitroGLYCERIN, pentafluoroprop-tetrafluoroeth, promethazine  Antibiotics  :   Anti-infectives (From admission, onward)   Start     Dose/Rate Route Frequency Ordered Stop   11/22/18 0600  cefUROXime (ZINACEF) 1.5 g in sodium chloride 0.9 % 100 mL IVPB     1.5 g 200 mL/hr over 30 Minutes Intravenous To Short Stay 11/21/18 0732 11/23/18 0600          Objective:   Vitals:  11/22/18 0800 11/22/18 0830 11/22/18 0900 11/22/18 0930  BP: (!) 146/75 (!) 166/55 (!) 157/79 (!) 163/78  Pulse: 75 77 76 74  Resp:      Temp:      TempSrc:      SpO2:      Weight:      Height:        Wt Readings from Last 3 Encounters:  11/22/18  79.6 kg  04/22/18 80.3 kg  04/22/18 80.5 kg    No intake or output data in the 24 hours ending 11/22/18 1014   Physical Exam  Awake Alert,  No new F.N deficits, Normal affect [place].AT,PERRAL Supple Neck,No JVD, No cervical lymphadenopathy appriciated.  Symmetrical Chest wall movement, Good air movement bilaterally, CTAB RRR,No Gallops, Rubs or new Murmurs, No Parasternal Heave +ve B.Sounds, Abd Soft, No tenderness, No organomegaly appriciated, No rebound - guarding or rigidity. No Cyanosis, Clubbing or edema, No new Rash or bruise     Data Review:    CBC Recent Labs  Lab 11/15/18 2304 11/17/18 2201 11/17/18 2320 11/20/18 0322 11/20/18 1902 11/22/18 0309  WBC 5.4 5.5  --  4.7  --  6.2  HGB 8.3* 7.1* 7.3* 7.3* 8.2* 8.8*  HCT 24.2* 20.9* 21.3* 23.3* 25.1* 25.7*  PLT 227 190  --  188  --  180  MCV 87.1 86.4  --  88.6  --  86.2  MCH 29.9 29.3  --  27.8  --  29.5  MCHC 34.3 34.0  --  31.3  --  34.2  RDW 12.9 13.2  --  12.9  --  12.4  LYMPHSABS 1.0  --   --   --   --   --   MONOABS 0.5  --   --   --   --   --   EOSABS 0.2  --   --   --   --   --   BASOSABS 0.0  --   --   --   --   --     Chemistries  Recent Labs  Lab 11/15/18 2304  11/18/18 0239 11/19/18 0234 11/20/18 0322 11/21/18 0325 11/22/18 0309  NA 132*   < > 133* 131* 131* 130* 127*  K 5.1   < > 3.5 4.0 4.1 4.5 4.6  CL 101   < > 98 96* 95* 94* 93*  CO2 17*   < > 22 23 23 23 22   GLUCOSE 178*   < > 113* 152* 127* 118* 120*  BUN 110*   < > 40* 50* 30* 41* 53*  CREATININE 11.30*   < > 5.82* 7.52* 5.53* 6.79* 7.61*  CALCIUM 7.2*   < > 7.7* 7.6* 7.5* 7.8* 7.6*  AST 16  --   --   --   --   --   --   ALT 21  --   --   --   --   --   --   ALKPHOS 72  --   --   --   --   --   --   BILITOT 0.6  --   --   --   --   --   --    < > = values in this interval not displayed.   ------------------------------------------------------------------------------------------------------------------ No results for input(s):  CHOL, HDL, LDLCALC, TRIG, CHOLHDL, LDLDIRECT in the last 72 hours.  Lab Results  Component Value Date   HGBA1C 6.0 (H) 11/16/2018   ------------------------------------------------------------------------------------------------------------------ No results for  input(s): TSH, T4TOTAL, T3FREE, THYROIDAB in the last 72 hours.  Invalid input(s): FREET3 ------------------------------------------------------------------------------------------------------------------ No results for input(s): VITAMINB12, FOLATE, FERRITIN, TIBC, IRON, RETICCTPCT in the last 72 hours.  Coagulation profile Recent Labs  Lab 11/16/18 0155  INR 1.1    No results for input(s): DDIMER in the last 72 hours.  Cardiac Enzymes No results for input(s): CKMB, TROPONINI, MYOGLOBIN in the last 168 hours.  Invalid input(s): CK ------------------------------------------------------------------------------------------------------------------    Component Value Date/Time   BNP 1,378.9 (H) 11/15/2018 2304    Micro Results Recent Results (from the past 240 hour(s))  SARS Coronavirus 2 [facility] order, Performed in [facility] hospital lab) Nasopharyngeal Nasopharyngeal Swab     Status: None   Collection Time: 11/15/18 11:15 PM   Specimen: Nasopharyngeal Swab  Result Value Ref Range Status   SARS Coronavirus 2 NEGATIVE NEGATIVE Final    Comment: (NOTE) If result is NEGATIVE SARS-CoV-2 target nucleic acids are NOT DETECTED. The SARS-CoV-2 RNA is generally detectable in upper and lower  respiratory specimens during the acute phase of infection. The lowest  concentration of SARS-CoV-2 viral copies this assay can detect is 250  copies / mL. A negative result does not preclude SARS-CoV-2 infection  and should not be used as the sole basis for treatment or other  patient management decisions.  A negative result may occur with  improper specimen collection / handling, submission of specimen other  than nasopharyngeal  swab, presence of viral mutation(s) within the  areas targeted by this assay, and inadequate number of viral copies  (<250 copies / mL). A negative result must be combined with clinical  observations, patient history, and epidemiological information. If result is POSITIVE SARS-CoV-2 target nucleic acids are DETECTED. The SARS-CoV-2 RNA is generally detectable in upper and lower  respiratory specimens dur ing the acute phase of infection.  Positive  results are indicative of active infection with SARS-CoV-2.  Clinical  correlation with patient history and other diagnostic information is  necessary to determine patient infection status.  Positive results do  not rule out bacterial infection or co-infection with other viruses. If result is PRESUMPTIVE POSTIVE SARS-CoV-2 nucleic acids MAY BE PRESENT.   A presumptive positive result was obtained on the submitted specimen  and confirmed on repeat testing.  While 2019 novel coronavirus  (SARS-CoV-2) nucleic acids may be present in the submitted sample  additional confirmatory testing may be necessary for epidemiological  and / or clinical management purposes  to differentiate between  SARS-CoV-2 and other Sarbecovirus currently known to infect humans.  If clinically indicated additional testing with an alternate test  methodology [phone]) is advised. The SARS-CoV-2 RNA is generally  detectable in upper and lower respiratory sp ecimens during the acute  phase of infection. The expected result is Negative. Fact Sheet for Patients:  StrictlyIdeas.no Fact Sheet for Healthcare Providers: BankingDealers.co.za This test is not yet approved or cleared by the [place] FDA and has been authorized for detection and/or diagnosis of SARS-CoV-2 by FDA under an Emergency Use Authorization (EUA).  This EUA will remain in effect (meaning this test can be used) for the duration of the COVID-19 declaration  under Section 564(b)(1) of the Act, 21 U.S.C. section 360bbb-3(b)(1), unless the authorization is terminated or revoked sooner. Performed at [facility], [place] [address], [place], [place] [address]     Radiology Reports US Renal  Result Date: 11/16/2018 CLINICAL DATA:  AKI with elevated BUN and creatinine EXAM: RENAL / URINARY TRACT ULTRASOUND COMPLETE  COMPARISON:  Ultrasound 03/10/2018 FINDINGS: Right Kidney: Renal measurements: 10.3 x 4.6 x 4.7 cm = volume: 114 mL . Echogenicity within normal limits. No mass or hydronephrosis visualized. Left Kidney: Renal measurements: 9.4 x 5.6 x 4.6 cm = volume: 125 mL. Echogenicity within normal limits. No mass or hydronephrosis visualized. Previously seen anechoic cyst in the interpolar region is no longer visualized. Bladder: Appears normal for degree of bladder distention. Other: Bilateral pleural effusions. IMPRESSION: Incidentally noted bilateral pleural effusions. Previously seen left renal cyst no longer visualized. Otherwise unremarkable renal ultrasound. Electronically Signed   By: Lovena Le M.D.   On: 11/16/2018 02:21   Ir Fluoro Guide Cv Line Right  Result Date: 11/17/2018 CLINICAL DATA:  Acute on chronic kidney disease EXAM: EXAM RIGHT IJ CATHETER PLACEMENT UNDER ULTRASOUND AND FLUOROSCOPIC GUIDANCE TECHNIQUE: The procedure, risks (including but not limited to bleeding, infection, organ damage, pneumothorax), benefits, and alternatives were explained to the patient. Questions regarding the procedure were encouraged and answered. The patient understands and consents to the procedure. Patency of the right IJ vein was confirmed with ultrasound with image documentation. An appropriate skin site was determined. Skin site was marked. Region was prepped using maximum barrier technique including cap and mask, sterile gown, sterile gloves, large sterile sheet, and Chlorhexidine as cutaneous antisepsis. The region was infiltrated locally with 1%  lidocaine. Under real-time ultrasound guidance, the right IJ vein was accessed with a 19 gauge needle; the needle tip within the vein was confirmed with ultrasound image documentation. The needle exchanged over a guidewire for vascular dilator which allowed advancement of a 16 cm Trialysis catheter. This was positioned with the tip at the cavoatrial junction. Spot chest radiograph shows good positioning and no pneumothorax. Catheter was flushed and sutured externally with 0-Prolene sutures. Patient tolerated the procedure well. FLUOROSCOPY TIME:  26 seconds, 1 image was saved. COMPLICATIONS: COMPLICATIONS none IMPRESSION: Technically successful right IJ Trialysis catheter placement. The catheter is ready for immediate use as clinically indicated. Electronically Signed   By: Constance Holster M.D.   On: 11/17/2018 08:04   Ir US Guide Vasc Access Right  Result Date: 11/17/2018 CLINICAL DATA:  Acute on chronic kidney disease EXAM: EXAM RIGHT IJ CATHETER PLACEMENT UNDER ULTRASOUND AND FLUOROSCOPIC GUIDANCE TECHNIQUE: The procedure, risks (including but not limited to bleeding, infection, organ damage, pneumothorax), benefits, and alternatives were explained to the patient. Questions regarding the procedure were encouraged and answered. The patient understands and consents to the procedure. Patency of the right IJ vein was confirmed with ultrasound with image documentation. An appropriate skin site was determined. Skin site was marked. Region was prepped using maximum barrier technique including cap and mask, sterile gown, sterile gloves, large sterile sheet, and Chlorhexidine as cutaneous antisepsis. The region was infiltrated locally with 1% lidocaine. Under real-time ultrasound guidance, the right IJ vein was accessed with a 19 gauge needle; the needle tip within the vein was confirmed with ultrasound image documentation. The needle exchanged over a guidewire for vascular dilator which allowed advancement of a 16  cm Trialysis catheter. This was positioned with the tip at the cavoatrial junction. Spot chest radiograph shows good positioning and no pneumothorax. Catheter was flushed and sutured externally with 0-Prolene sutures. Patient tolerated the procedure well. FLUOROSCOPY TIME:  26 seconds, 1 image was saved. COMPLICATIONS: COMPLICATIONS none IMPRESSION: Technically successful right IJ Trialysis catheter placement. The catheter is ready for immediate use as clinically indicated. Electronically Signed   By: Constance Holster M.D.   On: 11/17/2018 08:04  Dg Chest Port 1 View  Result Date: 11/15/2018 CLINICAL DATA:  Shortness of breath EXAM: PORTABLE CHEST 1 VIEW COMPARISON:  03/10/2018 FINDINGS: Cardiac shadow remains mildly enlarged. Diffuse vascular congestion is noted with interstitial edema and bilateral effusions. Bibasilar atelectasis is noted as well. IMPRESSION: Changes consistent with CHF with bibasilar atelectasis. Electronically Signed   By: Inez Catalina M.D.   On: 11/15/2018 23:20   Vas [place] Upper Ext Vein Mapping (pre-op Avf)  Result Date: 11/16/2018 UPPER EXTREMITY VEIN MAPPING  Indications: Pre-access. Comparison Study: no prior Performing Technologist: Abram Sander RVS  Examination Guidelines: A complete evaluation includes B-mode imaging, spectral Doppler, color Doppler, and power Doppler as needed of all accessible portions of each vessel. Bilateral testing is considered an integral part of a complete examination. Limited examinations for reoccurring indications may be performed as noted. +-----------------+-------------+----------+--------+  Right Cephalic    Diameter (cm) Depth (cm) Findings  +-----------------+-------------+----------+--------+  Shoulder              0.63         0.62              +-----------------+-------------+----------+--------+  Prox upper arm        0.59         0.51              +-----------------+-------------+----------+--------+  Mid upper arm         0.50          0.26              +-----------------+-------------+----------+--------+  Dist upper arm        0.47         0.28              +-----------------+-------------+----------+--------+  Antecubital fossa     0.64         0.38              +-----------------+-------------+----------+--------+  Prox forearm          0.50         0.35              +-----------------+-------------+----------+--------+  Mid forearm           0.37         0.26              +-----------------+-------------+----------+--------+  Dist forearm          0.32         0.19              +-----------------+-------------+----------+--------+ +-----------------+-------------+----------+--------+  Right Basilic     Diameter (cm) Depth (cm) Findings  +-----------------+-------------+----------+--------+  Prox upper arm        0.41         1.43              +-----------------+-------------+----------+--------+  Mid upper arm         0.51         1.24              +-----------------+-------------+----------+--------+  Dist upper arm        0.54         0.63              +-----------------+-------------+----------+--------+  Antecubital fossa     0.65         0.59              +-----------------+-------------+----------+--------+  Prox forearm  0.26         0.23              +-----------------+-------------+----------+--------+  Mid forearm           0.24         0.22              +-----------------+-------------+----------+--------+  Distal forearm        0.27         0.26              +-----------------+-------------+----------+--------+ +-----------------+-------------+----------+--------------+  Left Cephalic     Diameter (cm) Depth (cm)    Findings     +-----------------+-------------+----------+--------------+  Shoulder              0.31         0.39      branching     +-----------------+-------------+----------+--------------+  Prox upper arm        0.12         0.21      branching     +-----------------+-------------+----------+--------------+   Mid upper arm                              not visualized  +-----------------+-------------+----------+--------------+  Dist upper arm                             not visualized  +-----------------+-------------+----------+--------------+  Antecubital fossa                          not visualized  +-----------------+-------------+----------+--------------+  Prox forearm                               not visualized  +-----------------+-------------+----------+--------------+  Mid forearm                                not visualized  +-----------------+-------------+----------+--------------+  Dist forearm                               not visualized  +-----------------+-------------+----------+--------------+  Wrist                                      not visualized  +-----------------+-------------+----------+--------------+ +-----------------+-------------+----------+--------+  Left Basilic      Diameter (cm) Depth (cm) Findings  +-----------------+-------------+----------+--------+  Prox upper arm        0.91         1.10              +-----------------+-------------+----------+--------+  Mid upper arm         0.77         1.27              +-----------------+-------------+----------+--------+  Dist upper arm        0.64         0.50              +-----------------+-------------+----------+--------+  Antecubital fossa     0.76         0.59              +-----------------+-------------+----------+--------+  Prox  forearm          0.17         0.18              +-----------------+-------------+----------+--------+  Mid forearm           0.20         0.20              +-----------------+-------------+----------+--------+  Distal forearm        0.21         0.19              +-----------------+-------------+----------+--------+ *See table(s) above for measurements and observations.  Diagnosing physician: Deitra Mayo MD Electronically signed by Deitra Mayo MD on 11/16/2018 at 2:52:19 PM.    Final      Time Spent in minutes  30   Lala Lund M.D on 11/22/2018 at 10:14 AM  To page go to [email or web address] - password [email or web address]

## 2018-11-22 NOTE — Anesthesia Postprocedure Evaluation (Signed)
Anesthesia Post Note  Patient: Max Sanchez  Procedure(s) Performed: ARTERIOVENOUS (AV) FISTULA CREATION LEFT ARM (Left Arm Lower) INSERTION OF TUNNELED  DIALYSIS CATHETER, right internal jugular (Right Neck)     Patient location during evaluation: PACU Anesthesia Type: MAC Level of consciousness: awake and alert, patient cooperative and oriented Pain management: pain level controlled Vital Signs Assessment: post-procedure vital signs reviewed and stable Respiratory status: spontaneous breathing, nonlabored ventilation and respiratory function stable Cardiovascular status: blood pressure returned to baseline and stable Postop Assessment: no apparent nausea or vomiting Anesthetic complications: no    Last Vitals:  Vitals:   11/22/18 1525 11/22/18 1540  BP: (!) 162/71 (!) 178/70  Pulse: 69 68  Resp: (!) 9 10  Temp: (!) 36.2 C (!) 36.2 C  SpO2: 94% 93%    Last Pain:  Vitals:   11/22/18 1525  TempSrc:   PainSc: 0-No pain                 Rhonda Vangieson,E. Mamie Diiorio

## 2018-11-22 NOTE — Progress Notes (Signed)
Old right FA IV site has edema but no redness advised RN to elevate and apply warm compress.

## 2018-11-22 NOTE — Interval H&P Note (Signed)
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  The various methods of treatment have been discussed with the patient and family. After consideration of risks, benefits and other options for treatment, the patient has consented to  Procedure(s): ARTERIOVENOUS (AV) FISTULA CREATION LEFT ARM (Left) INSERTION OF TUNNELED  DIALYSIS CATHETER (N/A) as a surgical intervention.  The patient's history has been reviewed, patient examined, no change in status, stable for surgery.  I have reviewed the patient's chart and labs.  Questions were answered to the patient's satisfaction.     Curt Jews

## 2018-11-22 NOTE — Transfer of Care (Signed)
Immediate Anesthesia Transfer of Care Note  Patient: Max Sanchez  Procedure(s) Performed: ARTERIOVENOUS (AV) FISTULA CREATION LEFT ARM (Left Arm Lower) INSERTION OF TUNNELED  DIALYSIS CATHETER, right internal jugular (Right Neck)  Patient Location: PACU  Anesthesia Type:MAC  Level of Consciousness: awake, alert , oriented and patient cooperative  Airway & Oxygen Therapy: Patient Spontanous Breathing  Post-op Assessment: Report given to RN and Post -op Vital signs reviewed and stable  Post vital signs: Reviewed and stable  Last Vitals:  Vitals Value Taken Time  BP 162/71 11/22/18 1526  Temp    Pulse 67 11/22/18 1529  Resp 19 11/22/18 1529  SpO2 94 % 11/22/18 1529  Vitals shown include unvalidated device data.  Last Pain:  Vitals:   11/22/18 1107  TempSrc: Oral  PainSc: 0-No pain         Complications: No apparent anesthesia complications

## 2018-11-23 ENCOUNTER — Encounter (HOSPITAL_COMMUNITY): Payer: Self-pay | Admitting: Vascular Surgery

## 2018-11-23 LAB — GLUCOSE, CAPILLARY
Glucose-Capillary: 118 mg/dL — ABNORMAL HIGH (ref 70–99)
Glucose-Capillary: 187 mg/dL — ABNORMAL HIGH (ref 70–99)
Glucose-Capillary: 177 mg/dL — ABNORMAL HIGH (ref 70–99)
Glucose-Capillary: 188 mg/dL — ABNORMAL HIGH (ref 70–99)

## 2018-11-23 NOTE — Plan of Care (Signed)
  Problem: Clinical Measurements: Goal: Ability to maintain clinical measurements within normal limits will improve Outcome: Progressing   Problem: Pain Managment: Goal: General experience of comfort will improve Outcome: Progressing   

## 2018-11-23 NOTE — Discharge Instructions (Signed)
° °  [facility] ° °Discharge Instructions ° °AV Fistula or Graft Surgery for Dialysis Access ° °Please refer to the following instructions for your post-procedure care. Your surgeon or physician assistant will discuss any changes with you. ° °Activity ° °You may drive the day following your surgery, if you are comfortable and no longer taking prescription pain medication. Resume full activity as the soreness in your incision resolves. ° °Bathing/Showering ° °You may shower after you go home. Keep your incision dry for 48 hours. Do not soak in a bathtub, hot tub, or swim until the incision heals completely. You may not shower if you have a hemodialysis catheter. ° °Incision Care ° °Clean your incision with mild soap and water after 48 hours. Pat the area dry with a clean towel. You do not need a bandage unless otherwise instructed. Do not apply any ointments or creams to your incision. You may have skin glue on your incision. Do not peel it off. It will come off on its own in about one week. Your arm may swell a bit after surgery. To reduce swelling use pillows to elevate your arm so it is above your heart. Your doctor will tell you if you need to lightly wrap your arm with an ACE bandage. ° °Diet ° °Resume your normal diet. There are not special food restrictions following this procedure. In order to heal from your surgery, it is CRITICAL to get adequate nutrition. Your body requires vitamins, minerals, and protein. Vegetables are the best source of vitamins and minerals. Vegetables also provide the perfect balance of protein. Processed food has little nutritional value, so try to avoid this. ° °Medications ° °Resume taking all of your medications. If your incision is causing pain, you may take over-the counter pain relievers such as acetaminophen (Tylenol). If you were prescribed a stronger pain medication, please be aware these medications can cause nausea and constipation. Prevent  nausea by taking the medication with a snack or meal. Avoid constipation by drinking plenty of fluids and eating foods with high amount of fiber, such as fruits, vegetables, and grains. Do not take Tylenol if you are taking prescription pain medications. ° ° ° ° °Follow up °Your surgeon may want to see you in the office following your access surgery. If so, this will be arranged at the time of your surgery. ° °Please call us immediately for any of the following conditions: ° °Increased pain, redness, drainage (pus) from your incision site °Fever of 101 degrees or higher °Severe or worsening pain at your incision site °Hand pain or numbness. ° °Reduce your risk of vascular disease: ° °Stop smoking. If you would like help, call [facility] at 1-800-QUIT-NOW (1-800-784-8669) or [facility] at 336-586-4000 ° °Manage your cholesterol °Maintain a desired weight °Control your diabetes °Keep your blood pressure down ° °Dialysis ° °It will take several weeks to several months for your new dialysis access to be ready for use. Your surgeon will determine when it is OK to use it. Your nephrologist will continue to direct your dialysis. You can continue to use your Permcath until your new access is ready for use. ° °If you have any questions, please call the office at [phone]. ° °

## 2018-11-23 NOTE — Progress Notes (Signed)
  Progress Note    11/23/2018 8:00 AM 1 Day Post-Op  Subjective:  Some pain at R IJ TDC overnight   Vitals:   11/22/18 2137 11/23/18 0558  BP: (!) 150/79 (!) 154/79  Pulse: 75 68  Resp: 16   Temp: 98.5 F (36.9 C) 99.2 F (37.3 C)  SpO2: 100% 99%   Physical Exam: Lungs:  Non labored Incisions:  R IJ TDC without bleeding or hematoma; L wrist c/d/i Extremities:  Grip strength intact L hand; palpable thrill at anastomosis of fistula Neurologic: A&O  CBC    Component Value Date/Time   WBC 4.8 11/22/2018 1822   RBC 2.98 (L) 11/22/2018 1822   HGB 8.9 (L) 11/22/2018 1822   HCT 26.0 (L) 11/22/2018 1822   PLT 178 11/22/2018 1822   MCV 87.2 11/22/2018 1822   MCV 82.2 08/29/2015 1652   MCH 29.9 11/22/2018 1822   MCHC 34.2 11/22/2018 1822   RDW 12.4 11/22/2018 1822   LYMPHSABS 1.0 11/15/2018 2304   MONOABS 0.5 11/15/2018 2304   EOSABS 0.2 11/15/2018 2304   BASOSABS 0.0 11/15/2018 2304    BMET    Component Value Date/Time   NA 134 (L) 11/22/2018 1147   NA 135 04/22/2018 1130   K 3.2 (L) 11/22/2018 1147   CL 93 (L) 11/22/2018 0309   CO2 22 11/22/2018 0309   GLUCOSE 113 (H) 11/22/2018 1147   BUN 53 (H) 11/22/2018 0309   BUN 33 (H) 04/22/2018 1130   CREATININE 7.61 (H) 11/22/2018 0309   CREATININE 1.27 08/29/2015 1633   CALCIUM 7.6 (L) 11/22/2018 0309   GFRNONAA 7 (L) 11/22/2018 0309   GFRNONAA 66 08/29/2015 1633   GFRAA 9 (L) 11/22/2018 0309   GFRAA 76 08/29/2015 1633    INR    Component Value Date/Time   INR 1.1 11/16/2018 0155     Intake/Output Summary (Last 24 hours) at 11/23/2018 0800 Last data filed at 11/22/2018 1503 Gross per 24 hour  Intake 300 ml  Output 2005 ml  Net -1705 ml     Assessment/Plan:  53 y.o. male is s/p R IJ TDC and L radiocephalic fistula 1 Day Post-Op   R IJ TDC ready for use Patent L radiocephalic fistula with palpable thrill Check fistula duplex in 6 weeks Ok for discharge from vascular surgery standpoint   Dagoberto Ligas, PA-C [facility] [phone] 11/23/2018 8:00 AM

## 2018-11-23 NOTE — Progress Notes (Signed)
Renal Navigator still awaiting financial clearance for patient to be approved for OP HD treatment. Patient is not cleared for OP HD treatment at this time. Renal Navigator continues to follow closely.  Alphonzo Cruise, [place] Renal Navigator [phone]

## 2018-11-23 NOTE — Progress Notes (Signed)
[facility] Progress Note    Assessment/ Plan:   1. Acute Hypoxic respiratory failure: Requiring HFNC O2-->with CXR c/w pulmonary edema in setting of worsening edema. This has improved since starting dialysis and he's on RA now.   2. 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 Renal US was normal in 03/2018 and without obstruction now. HD ordered as above.   - I've asked [facility] to see for perm access  - CLIP make take some time as he's only eligible for emergency medicaid (appreciate Colleen's efforts)  On MWF schedule -> next  HD Wed   3. Anemia: Suspect anemia of CKD. Iron deficient- ferrlicet with HD  4 HTN: Markedly hypertensive at admission. Now better. Resume home meds, BP better since beginning HD  5 BMM: Phos up (8 on 9/15), Started sevelamer 800 TID AC on 9/21 -> will recheck in 2 days. PTH OK   6 DM: Recent A1cs controlled; care per primary.   7. Elevated trop: 25-->168-->525 now, then flattened out. Per primary   8. Dispo: pending CLIP and perm access  Subjective:   Denies f/c/n/v. + constipation.. Was wondering why he had to stay in the hospital and I explained that there was not a seat in outpt HD yet.   Objective:   BP (!) 148/71   Pulse 73   Temp 99.2 F (37.3 C) (Oral)   Resp 16   Ht 5' 5.98" (1.676 m)   Wt 78.7 kg   SpO2 99%   BMI 28.02 kg/m   Intake/Output Summary (Last 24 hours) at 11/23/2018 1132 Last data filed at 11/23/2018 0900 Gross per 24 hour  Intake 780 ml  Output 5 ml  Net 775 ml   Weight change: -2.7 kg  Physical Exam: Gen: older gentleman, NAD CVS: RRR no m/r/g Resp: on RA now Abd: abd fullness better Ext: no LE edema Access: RIJ temp  Imaging: Dg Chest Port 1v Same Day  Result Date: 11/22/2018 CLINICAL DATA:  Postop right dialysis catheter placement. EXAM: PORTABLE CHEST 1 VIEW COMPARISON:   Radiographs 11/15/2018 and 03/10/2018. FINDINGS: 1556 hours. New right IJ hemodialysis catheter extends to the level of the right atrium. There is stable cardiomegaly. Pulmonary edema has resolved, although there is persistent vascular congestion and small bilateral pleural effusions. The overall aeration of the lung bases has improved with probable residual left lower lobe atelectasis. No pneumothorax. The bones appear unchanged. IMPRESSION: 1. Hemodialysis catheter tip overlies the right atrium. No pneumothorax. 2. Improved pulmonary edema with persistent bilateral pleural effusions. Electronically Signed   By: Richardean Sale M.D.   On: 11/22/2018 16:13   Dg Fluoro Guide Cv Line-no Report  Result Date: 11/22/2018 Fluoroscopy was utilized by the requesting physician.  No radiographic interpretation.    Labs: BMET Recent Labs  Lab 11/16/18 1315 11/17/18 0340 11/18/18 0239 11/19/18 0234 11/20/18 0322 11/21/18 0325 11/22/18 0309 11/22/18 1147  NA 132* 133* 133* 131* 131* 130* 127* 134*  K 4.8 4.9 3.5 4.0 4.1 4.5 4.6 3.2*  CL 102 101 98 96* 95* 94* 93*  --   CO2 15* 14* 22 23 23 23 22   --   GLUCOSE 101* 97 113* 152* 127* 118* 120* 113*  BUN 116* 116* 40* 50* 30* 41* 53*  --   CREATININE 11.84* 12.60* 5.82* 7.52* 5.53* 6.79* 7.61*  --   CALCIUM 7.0* 7.0* 7.7* 7.6* 7.5* 7.8* 7.6*  --  PHOS 8.0*  --   --   --   --   --   --   --    CBC Recent Labs  Lab 11/17/18 2201  11/20/18 0322 11/20/18 1902 11/22/18 0309 11/22/18 1147 11/22/18 1822  WBC 5.5  --  4.7  --  6.2  --  4.8  HGB 7.1*   < > 7.3* 8.2* 8.8* 8.8* 8.9*  HCT 20.9*   < > 23.3* 25.1* 25.7* 26.0* 26.0*  MCV 86.4  --  88.6  --  86.2  --  87.2  PLT 190  --  188  --  180  --  178   < > = values in this interval not displayed.    Medications:    . amLODipine  10 mg Oral Daily  . aspirin EC  81 mg Oral Daily  . atorvastatin  40 mg Oral q1800  . Chlorhexidine Gluconate Cloth  6 each Topical Q0600  . heparin injection  (subcutaneous)  5,000 Units Subcutaneous Q8H  . hydrALAZINE  50 mg Oral Q8H  . insulin aspart  0-5 Units Subcutaneous QHS  . insulin aspart  0-9 Units Subcutaneous TID WC  . labetalol  100 mg Oral Daily  . sevelamer carbonate  800 mg Oral TID WC      Otelia Santee, MD 11/23/2018, 11:32 AM

## 2018-11-23 NOTE — Progress Notes (Signed)
PROGRESS NOTE                                                                                                                                                                                                             Patient Demographics:    Max Sanchez, is a 53 y.o. male, DOB - 1965/12/26, SA:[id number]  Admit date - 11/15/2018   Admitting Physician Ivor Costa, MD  Outpatient Primary MD for the patient is Scot Jun, FNP  LOS - 7  Chief Complaint  Patient presents with   Respiratory Distress   Shortness of Breath       Brief Narrative  Max Sanchez is a 53 y.o. male with medical history significant of HTN, HLD, DM, h/o CVA (03/2018), CKD-IV, anemia, dCHF,who presents with shortness of breath.  In the ER he was diagnosed with acute on chronic CHF due to fluid overload caused by renal failure.  Nephrology was consulted and we were requested to admit the patient.    Subjective:   Patient in bed getting dialyzed, denies any headache chest or abdominal pain.  No shortness of breath.   Assessment  & Plan :     1.  Acute hypoxic respiratory failure due to fluid overload caused by ESRD intern causing acute on chronic diastolic CHF recent echo in January showed EF of 60%.  Seen by nephrology, did not respond to diuretics, dialysis started on 11/16/2018 with good effect shortness of breath has improved, echo showed EF of 60% with chronic diastolic CHF.  Currently compensated.   2.  Hypertension.  Currently on combination of Norvasc, hydralazine and beta-blocker.  Blood pressure stable now.  Home dose beta-blocker has been cut in half.  3.  AKI on CKD 5 now progressed to ESRD.  Nephrology consulted, underwent right IJ dialysis catheter placement, dialysis was started on 11/16/2018 evening.  Discussed plan of care with nephrologist Dr. Hollie Salk on 11/19/2018, he is now status post left radiocephalic AV fistula creation by Dr. Donnetta Hutching on 11/22/2018 also exchange of right IJ  tunneled HD catheter to her temporary catheter.  Case discussed with nephrologist Dr. Augustin Coupe on 11/23/2018.  Patient does not have an outpatient dialysis unit yet.  Await outpatient HD arrangements prior to discharge.  4.  Dyslipidemia.  Continue home dose statin.  5.  History of stroke.  Continue aspirin and statin for secondary prevention.    6.  Troponin elevation.  In non-ACS pattern due to stress caused by acute hypoxic respiratory failure and  CHF, in the setting of ESRD.  Trend is flat, chest pain-free, EKG nonacute. Continue aspirin, beta-blocker and statin.  Echo noted with preserved EF and no wall motion abnormality.  7.  Anemia of chronic disease.  Received 1 unit of packed RBC on 11/20/2018.  Stable.  8. DM type II.  On sliding scale.  CBG (last 3)  Recent Labs    11/22/18 1717 11/22/18 2018 11/23/18 0804  GLUCAP 123* 171* 118*    Lab Results  Component Value Date   HGBA1C 6.0 (H) 11/16/2018     Family Communication  : Daughter bedside 9/15, 9/16, 9/19  Code Status : Full  Disposition Plan  : MedSurg, discharge once outpatient HD arrangement made  Consults  : Nephrology, vascular surgery  Procedures  :    On 11/22/2018 Dr Early [facility] .   #1 exchange of right IJ tunneled catheter for temporary catheter, #2 left radiocephalic AV fistula creation  R.IJ HD cath - 9/15  TTE -   1. The left ventricle has normal systolic function with an ejection fraction of 60-65%. The cavity size was mildly dilated. Left ventricular diastolic Doppler parameters are consistent with pseudonormalization. Elevated left ventricular end-diastolic pressure.  2. The right ventricle has normal systolic function. The cavity was normal. There is No increase in right ventricular wall thickness.  3. Left atrial size was severely dilated.  4. Right atrial size was normal.  5. Moderate pericardial effusion.  6. No evidence of mitral valve stenosis.  7. The aortic valve is tricuspid. Aortic valve  regurgitation is mild by color flow Doppler. No stenosis of the aortic valve.  8. Pulmonic valve regurgitation is mild by color flow Doppler.  9. The aorta is normal unless otherwise noted. 10. The inferior vena cava was is dilated in size with >50% respiratory variability, suggesting right atrial pressure of 8 mmHg.  DVT Prophylaxis  :    Heparin added  Lab Results  Component Value Date   PLT 178 11/22/2018    Diet :  Diet Order            Diet renal with fluid restriction Fluid restriction: 1200 mL Fluid; Room service appropriate? No; Fluid consistency: Thin  Diet effective now               Inpatient Medications Scheduled Meds:  amLODipine  10 mg Oral Daily   aspirin EC  81 mg Oral Daily   atorvastatin  40 mg Oral q1800   Chlorhexidine Gluconate Cloth  6 each Topical Q0600   heparin injection (subcutaneous)  5,000 Units Subcutaneous Q8H   hydrALAZINE  50 mg Oral Q8H   insulin aspart  0-5 Units Subcutaneous QHS   insulin aspart  0-9 Units Subcutaneous TID WC   labetalol  100 mg Oral Daily   sevelamer carbonate  800 mg Oral TID WC   Continuous Infusions:  sodium chloride     sodium chloride 10 mL/hr at 11/22/18 1208   ferric gluconate (FERRLECIT/NULECIT) IV Stopped (11/20/18 1343)   PRN Meds:.sodium chloride, acetaminophen, alteplase, heparin, hydrALAZINE, lidocaine (PF), lidocaine-prilocaine, morphine injection, nitroGLYCERIN, pentafluoroprop-tetrafluoroeth, promethazine  Antibiotics  :   Anti-infectives (From admission, onward)   Start     Dose/Rate Route Frequency Ordered Stop   11/22/18 0600  cefUROXime (ZINACEF) 1.5 g in sodium chloride 0.9 % 100 mL IVPB     1.5 g 200 mL/hr over 30 Minutes Intravenous To Short Stay 11/21/18 0732 11/23/18 0806  Objective:   Vitals:   11/22/18 1625 11/22/18 2137 11/23/18 0500 11/23/18 0558  BP: (!) 146/74 (!) 150/79  (!) 154/79  Pulse: 64 75  68  Resp: 10 16    Temp: 98.8 F (37.1 C) 98.5 F (36.9  C)  99.2 F (37.3 C)  TempSrc: Oral   Oral  SpO2: 97% 100%  99%  Weight:   78.7 kg   Height:        Wt Readings from Last 3 Encounters:  11/23/18 78.7 kg  04/22/18 80.3 kg  04/22/18 80.5 kg     Intake/Output Summary (Last 24 hours) at 11/23/2018 0954 Last data filed at 11/23/2018 0900 Gross per 24 hour  Intake 780 ml  Output 2005 ml  Net -1225 ml     Physical Exam  Awake Alert,  No new F.N deficits, Normal affect [place].AT,PERRAL Supple Neck,No JVD, No cervical lymphadenopathy appriciated.  Symmetrical Chest wall movement, Good air movement bilaterally, CTAB RRR,No Gallops, Rubs or new Murmurs, No Parasternal Heave +ve B.Sounds, Abd Soft, No tenderness, No organomegaly appriciated, No rebound - guarding or rigidity. No Cyanosis, Clubbing or edema, No new Rash or bruise     Data Review:    CBC Recent Labs  Lab 11/17/18 2201  11/20/18 0322 11/20/18 1902 11/22/18 0309 11/22/18 1147 11/22/18 1822  WBC 5.5  --  4.7  --  6.2  --  4.8  HGB 7.1*   < > 7.3* 8.2* 8.8* 8.8* 8.9*  HCT 20.9*   < > 23.3* 25.1* 25.7* 26.0* 26.0*  PLT 190  --  188  --  180  --  178  MCV 86.4  --  88.6  --  86.2  --  87.2  MCH 29.3  --  27.8  --  29.5  --  29.9  MCHC 34.0  --  31.3  --  34.2  --  34.2  RDW 13.2  --  12.9  --  12.4  --  12.4   < > = values in this interval not displayed.    Chemistries  Recent Labs  Lab 11/18/18 0239 11/19/18 0234 11/20/18 0322 11/21/18 0325 11/22/18 0309 11/22/18 1147  NA 133* 131* 131* 130* 127* 134*  K 3.5 4.0 4.1 4.5 4.6 3.2*  CL 98 96* 95* 94* 93*  --   CO2 22 23 23 23 22   --   GLUCOSE 113* 152* 127* 118* 120* 113*  BUN 40* 50* 30* 41* 53*  --   CREATININE 5.82* 7.52* 5.53* 6.79* 7.61*  --   CALCIUM 7.7* 7.6* 7.5* 7.8* 7.6*  --    ------------------------------------------------------------------------------------------------------------------ No results for input(s): CHOL, HDL, LDLCALC, TRIG, CHOLHDL, LDLDIRECT in the last 72 hours.  Lab  Results  Component Value Date   HGBA1C 6.0 (H) 11/16/2018   ------------------------------------------------------------------------------------------------------------------ No results for input(s): TSH, T4TOTAL, T3FREE, THYROIDAB in the last 72 hours.  Invalid input(s): FREET3 ------------------------------------------------------------------------------------------------------------------ No results for input(s): VITAMINB12, FOLATE, FERRITIN, TIBC, IRON, RETICCTPCT in the last 72 hours.  Coagulation profile No results for input(s): INR, PROTIME in the last 168 hours.  No results for input(s): DDIMER in the last 72 hours.  Cardiac Enzymes No results for input(s): CKMB, TROPONINI, MYOGLOBIN in the last 168 hours.  Invalid input(s): CK ------------------------------------------------------------------------------------------------------------------    Component Value Date/Time   BNP 1,378.9 (H) 11/15/2018 2304    Micro Results Recent Results (from the past 240 hour(s))  SARS Coronavirus 2 [facility] order, Performed in [facility] hospital lab) Nasopharyngeal Nasopharyngeal Swab  Status: None   Collection Time: 11/15/18 11:15 PM   Specimen: Nasopharyngeal Swab  Result Value Ref Range Status   SARS Coronavirus 2 NEGATIVE NEGATIVE Final    Comment: (NOTE) If result is NEGATIVE SARS-CoV-2 target nucleic acids are NOT DETECTED. The SARS-CoV-2 RNA is generally detectable in upper and lower  respiratory specimens during the acute phase of infection. The lowest  concentration of SARS-CoV-2 viral copies this assay can detect is 250  copies / mL. A negative result does not preclude SARS-CoV-2 infection  and should not be used as the sole basis for treatment or other  patient management decisions.  A negative result may occur with  improper specimen collection / handling, submission of specimen other  than nasopharyngeal swab, presence of viral mutation(s) within the  areas  targeted by this assay, and inadequate number of viral copies  (<250 copies / mL). A negative result must be combined with clinical  observations, patient history, and epidemiological information. If result is POSITIVE SARS-CoV-2 target nucleic acids are DETECTED. The SARS-CoV-2 RNA is generally detectable in upper and lower  respiratory specimens dur ing the acute phase of infection.  Positive  results are indicative of active infection with SARS-CoV-2.  Clinical  correlation with patient history and other diagnostic information is  necessary to determine patient infection status.  Positive results do  not rule out bacterial infection or co-infection with other viruses. If result is PRESUMPTIVE POSTIVE SARS-CoV-2 nucleic acids MAY BE PRESENT.   A presumptive positive result was obtained on the submitted specimen  and confirmed on repeat testing.  While 2019 novel coronavirus  (SARS-CoV-2) nucleic acids may be present in the submitted sample  additional confirmatory testing may be necessary for epidemiological  and / or clinical management purposes  to differentiate between  SARS-CoV-2 and other Sarbecovirus currently known to infect humans.  If clinically indicated additional testing with an alternate test  methodology [phone]) is advised. The SARS-CoV-2 RNA is generally  detectable in upper and lower respiratory sp ecimens during the acute  phase of infection. The expected result is Negative. Fact Sheet for Patients:  StrictlyIdeas.no Fact Sheet for Healthcare Providers: BankingDealers.co.za This test is not yet approved or cleared by the [place] FDA and has been authorized for detection and/or diagnosis of SARS-CoV-2 by FDA under an Emergency Use Authorization (EUA).  This EUA will remain in effect (meaning this test can be used) for the duration of the COVID-19 declaration under Section 564(b)(1) of the Act, 21 U.S.C. section  360bbb-3(b)(1), unless the authorization is terminated or revoked sooner. Performed at [facility], [place] [address], [place], [place] [address]     Radiology Reports US Renal  Result Date: 11/16/2018 CLINICAL DATA:  AKI with elevated BUN and creatinine EXAM: RENAL / URINARY TRACT ULTRASOUND COMPLETE COMPARISON:  Ultrasound 03/10/2018 FINDINGS: Right Kidney: Renal measurements: 10.3 x 4.6 x 4.7 cm = volume: 114 mL . Echogenicity within normal limits. No mass or hydronephrosis visualized. Left Kidney: Renal measurements: 9.4 x 5.6 x 4.6 cm = volume: 125 mL. Echogenicity within normal limits. No mass or hydronephrosis visualized. Previously seen anechoic cyst in the interpolar region is no longer visualized. Bladder: Appears normal for degree of bladder distention. Other: Bilateral pleural effusions. IMPRESSION: Incidentally noted bilateral pleural effusions. Previously seen left renal cyst no longer visualized. Otherwise unremarkable renal ultrasound. Electronically Signed   By: Lovena Le M.D.   On: 11/16/2018 02:21   Ir Fluoro Guide Cv Line Right  Result Date:  11/17/2018 CLINICAL DATA:  Acute on chronic kidney disease EXAM: EXAM RIGHT IJ CATHETER PLACEMENT UNDER ULTRASOUND AND FLUOROSCOPIC GUIDANCE TECHNIQUE: The procedure, risks (including but not limited to bleeding, infection, organ damage, pneumothorax), benefits, and alternatives were explained to the patient. Questions regarding the procedure were encouraged and answered. The patient understands and consents to the procedure. Patency of the right IJ vein was confirmed with ultrasound with image documentation. An appropriate skin site was determined. Skin site was marked. Region was prepped using maximum barrier technique including cap and mask, sterile gown, sterile gloves, large sterile sheet, and Chlorhexidine as cutaneous antisepsis. The region was infiltrated locally with 1% lidocaine. Under real-time ultrasound guidance, the right IJ  vein was accessed with a 19 gauge needle; the needle tip within the vein was confirmed with ultrasound image documentation. The needle exchanged over a guidewire for vascular dilator which allowed advancement of a 16 cm Trialysis catheter. This was positioned with the tip at the cavoatrial junction. Spot chest radiograph shows good positioning and no pneumothorax. Catheter was flushed and sutured externally with 0-Prolene sutures. Patient tolerated the procedure well. FLUOROSCOPY TIME:  26 seconds, 1 image was saved. COMPLICATIONS: COMPLICATIONS none IMPRESSION: Technically successful right IJ Trialysis catheter placement. The catheter is ready for immediate use as clinically indicated. Electronically Signed   By: Constance Holster M.D.   On: 11/17/2018 08:04   Ir US Guide Vasc Access Right  Result Date: 11/17/2018 CLINICAL DATA:  Acute on chronic kidney disease EXAM: EXAM RIGHT IJ CATHETER PLACEMENT UNDER ULTRASOUND AND FLUOROSCOPIC GUIDANCE TECHNIQUE: The procedure, risks (including but not limited to bleeding, infection, organ damage, pneumothorax), benefits, and alternatives were explained to the patient. Questions regarding the procedure were encouraged and answered. The patient understands and consents to the procedure. Patency of the right IJ vein was confirmed with ultrasound with image documentation. An appropriate skin site was determined. Skin site was marked. Region was prepped using maximum barrier technique including cap and mask, sterile gown, sterile gloves, large sterile sheet, and Chlorhexidine as cutaneous antisepsis. The region was infiltrated locally with 1% lidocaine. Under real-time ultrasound guidance, the right IJ vein was accessed with a 19 gauge needle; the needle tip within the vein was confirmed with ultrasound image documentation. The needle exchanged over a guidewire for vascular dilator which allowed advancement of a 16 cm Trialysis catheter. This was positioned with the tip at  the cavoatrial junction. Spot chest radiograph shows good positioning and no pneumothorax. Catheter was flushed and sutured externally with 0-Prolene sutures. Patient tolerated the procedure well. FLUOROSCOPY TIME:  26 seconds, 1 image was saved. COMPLICATIONS: COMPLICATIONS none IMPRESSION: Technically successful right IJ Trialysis catheter placement. The catheter is ready for immediate use as clinically indicated. Electronically Signed   By: Constance Holster M.D.   On: 11/17/2018 08:04   Dg Chest Port 1 View  Result Date: 11/15/2018 CLINICAL DATA:  Shortness of breath EXAM: PORTABLE CHEST 1 VIEW COMPARISON:  03/10/2018 FINDINGS: Cardiac shadow remains mildly enlarged. Diffuse vascular congestion is noted with interstitial edema and bilateral effusions. Bibasilar atelectasis is noted as well. IMPRESSION: Changes consistent with CHF with bibasilar atelectasis. Electronically Signed   By: Inez Catalina M.D.   On: 11/15/2018 23:20   Dg Chest Port 1v Same Day  Result Date: 11/22/2018 CLINICAL DATA:  Postop right dialysis catheter placement. EXAM: PORTABLE CHEST 1 VIEW COMPARISON:  Radiographs 11/15/2018 and 03/10/2018. FINDINGS: 1556 hours. New right IJ hemodialysis catheter extends to the level of the right atrium. There is  stable cardiomegaly. Pulmonary edema has resolved, although there is persistent vascular congestion and small bilateral pleural effusions. The overall aeration of the lung bases has improved with probable residual left lower lobe atelectasis. No pneumothorax. The bones appear unchanged. IMPRESSION: 1. Hemodialysis catheter tip overlies the right atrium. No pneumothorax. 2. Improved pulmonary edema with persistent bilateral pleural effusions. Electronically Signed   By: Richardean Sale M.D.   On: 11/22/2018 16:13   Dg Fluoro Guide Cv Line-no Report  Result Date: 11/22/2018 Fluoroscopy was utilized by the requesting physician.  No radiographic interpretation.   Vas [place] Upper Ext Vein  Mapping (pre-op Avf)  Result Date: 11/16/2018 UPPER EXTREMITY VEIN MAPPING  Indications: Pre-access. Comparison Study: no prior Performing Technologist: Abram Sander RVS  Examination Guidelines: A complete evaluation includes B-mode imaging, spectral Doppler, color Doppler, and power Doppler as needed of all accessible portions of each vessel. Bilateral testing is considered an integral part of a complete examination. Limited examinations for reoccurring indications may be performed as noted. +-----------------+-------------+----------+--------+  Right Cephalic    Diameter (cm) Depth (cm) Findings  +-----------------+-------------+----------+--------+  Shoulder              0.63         0.62              +-----------------+-------------+----------+--------+  Prox upper arm        0.59         0.51              +-----------------+-------------+----------+--------+  Mid upper arm         0.50         0.26              +-----------------+-------------+----------+--------+  Dist upper arm        0.47         0.28              +-----------------+-------------+----------+--------+  Antecubital fossa     0.64         0.38              +-----------------+-------------+----------+--------+  Prox forearm          0.50         0.35              +-----------------+-------------+----------+--------+  Mid forearm           0.37         0.26              +-----------------+-------------+----------+--------+  Dist forearm          0.32         0.19              +-----------------+-------------+----------+--------+ +-----------------+-------------+----------+--------+  Right Basilic     Diameter (cm) Depth (cm) Findings  +-----------------+-------------+----------+--------+  Prox upper arm        0.41         1.43              +-----------------+-------------+----------+--------+  Mid upper arm         0.51         1.24              +-----------------+-------------+----------+--------+  Dist upper arm        0.54         0.63               +-----------------+-------------+----------+--------+  Antecubital fossa  0.65         0.59              +-----------------+-------------+----------+--------+  Prox forearm          0.26         0.23              +-----------------+-------------+----------+--------+  Mid forearm           0.24         0.22              +-----------------+-------------+----------+--------+  Distal forearm        0.27         0.26              +-----------------+-------------+----------+--------+ +-----------------+-------------+----------+--------------+  Left Cephalic     Diameter (cm) Depth (cm)    Findings     +-----------------+-------------+----------+--------------+  Shoulder              0.31         0.39      branching     +-----------------+-------------+----------+--------------+  Prox upper arm        0.12         0.21      branching     +-----------------+-------------+----------+--------------+  Mid upper arm                              not visualized  +-----------------+-------------+----------+--------------+  Dist upper arm                             not visualized  +-----------------+-------------+----------+--------------+  Antecubital fossa                          not visualized  +-----------------+-------------+----------+--------------+  Prox forearm                               not visualized  +-----------------+-------------+----------+--------------+  Mid forearm                                not visualized  +-----------------+-------------+----------+--------------+  Dist forearm                               not visualized  +-----------------+-------------+----------+--------------+  Wrist                                      not visualized  +-----------------+-------------+----------+--------------+ +-----------------+-------------+----------+--------+  Left Basilic      Diameter (cm) Depth (cm) Findings  +-----------------+-------------+----------+--------+  Prox upper arm        0.91          1.10              +-----------------+-------------+----------+--------+  Mid upper arm         0.77         1.27              +-----------------+-------------+----------+--------+  Dist upper arm        0.64         0.50              +-----------------+-------------+----------+--------+  Antecubital fossa     0.76         0.59              +-----------------+-------------+----------+--------+  Prox forearm          0.17         0.18              +-----------------+-------------+----------+--------+  Mid forearm           0.20         0.20              +-----------------+-------------+----------+--------+  Distal forearm        0.21         0.19              +-----------------+-------------+----------+--------+ *See table(s) above for measurements and observations.  Diagnosing physician: Deitra Mayo MD Electronically signed by Deitra Mayo MD on 11/16/2018 at 2:52:19 PM.    Final     Time Spent in minutes  30   Lala Lund M.D on 11/23/2018 at 9:54 AM  To page go to [email or web address] - password [email or web address]

## 2018-11-24 LAB — RENAL FUNCTION PANEL
Albumin: 2.1 g/dL — ABNORMAL LOW (ref 3.5–5.0)
Albumin: 2.2 g/dL — ABNORMAL LOW (ref 3.5–5.0)
Anion gap: 11 (ref 5–15)
Anion gap: 9 (ref 5–15)
BUN: 46 mg/dL — ABNORMAL HIGH (ref 6–20)
BUN: 46 mg/dL — ABNORMAL HIGH (ref 6–20)
CO2: 24 mmol/L (ref 22–32)
CO2: 26 mmol/L (ref 22–32)
Calcium: 7.5 mg/dL — ABNORMAL LOW (ref 8.9–10.3)
Calcium: 7.6 mg/dL — ABNORMAL LOW (ref 8.9–10.3)
Chloride: 94 mmol/L — ABNORMAL LOW (ref 98–111)
Chloride: 95 mmol/L — ABNORMAL LOW (ref 98–111)
Creatinine, Ser: 6.63 mg/dL — ABNORMAL HIGH (ref 0.61–1.24)
Creatinine, Ser: 6.7 mg/dL — ABNORMAL HIGH (ref 0.61–1.24)
GFR calc Af Amer: 10 mL/min — ABNORMAL LOW (ref >60)
GFR calc Af Amer: 10 mL/min — ABNORMAL LOW (ref >60)
GFR calc non Af Amer: 9 mL/min — ABNORMAL LOW (ref >60)
GFR calc non Af Amer: 9 mL/min — ABNORMAL LOW (ref >60)
Glucose, Bld: 220 mg/dL — ABNORMAL HIGH (ref 70–99)
Glucose, Bld: 197 mg/dL — ABNORMAL HIGH (ref 70–99)
Phosphorus: 4.7 mg/dL — ABNORMAL HIGH (ref 2.5–4.6)
Phosphorus: 4.6 mg/dL (ref 2.5–4.6)
Potassium: 4 mmol/L (ref 3.5–5.1)
Potassium: 3.9 mmol/L (ref 3.5–5.1)
Sodium: 129 mmol/L — ABNORMAL LOW (ref 135–145)
Sodium: 130 mmol/L — ABNORMAL LOW (ref 135–145)

## 2018-11-24 LAB — GLUCOSE, CAPILLARY
Glucose-Capillary: 137 mg/dL — ABNORMAL HIGH (ref 70–99)
Glucose-Capillary: 199 mg/dL — ABNORMAL HIGH (ref 70–99)
Glucose-Capillary: 115 mg/dL — ABNORMAL HIGH (ref 70–99)
Glucose-Capillary: 291 mg/dL — ABNORMAL HIGH (ref 70–99)

## 2018-11-24 LAB — CBC
HCT: 24.1 % — ABNORMAL LOW (ref 39.0–52.0)
HCT: 23.3 % — ABNORMAL LOW (ref 39.0–52.0)
Hemoglobin: 8.2 g/dL — ABNORMAL LOW (ref 13.0–17.0)
Hemoglobin: 8 g/dL — ABNORMAL LOW (ref 13.0–17.0)
MCH: 29.7 pg (ref 26.0–34.0)
MCH: 30.4 pg (ref 26.0–34.0)
MCHC: 34 g/dL (ref 30.0–36.0)
MCHC: 34.3 g/dL (ref 30.0–36.0)
MCV: 87.3 fL (ref 80.0–100.0)
MCV: 88.6 fL (ref 80.0–100.0)
Platelets: 182 10*3/uL (ref 150–400)
Platelets: 182 10*3/uL (ref 150–400)
RBC: 2.76 MIL/uL — ABNORMAL LOW (ref 4.22–5.81)
RBC: 2.63 MIL/uL — ABNORMAL LOW (ref 4.22–5.81)
RDW: 12.4 % (ref 11.5–15.5)
RDW: 12.3 % (ref 11.5–15.5)
WBC: 6.2 10*3/uL (ref 4.0–10.5)
WBC: 5.8 10*3/uL (ref 4.0–10.5)
nRBC: 0 % (ref 0.0–0.2)
nRBC: 0 % (ref 0.0–0.2)

## 2018-11-24 MED ORDER — HEPARIN SODIUM (PORCINE) 1000 UNIT/ML IJ SOLN
INTRAMUSCULAR | Status: AC
  Filled 2018-11-24: qty 4

## 2018-11-24 MED ORDER — DARBEPOETIN ALFA 60 MCG/0.3ML IJ SOSY
60.0000 ug | PREFILLED_SYRINGE | INTRAMUSCULAR | Status: DC
  Administered 2018-11-24: 60 ug via INTRAVENOUS
  Filled 2018-11-24: qty 0.3

## 2018-11-24 MED ORDER — DARBEPOETIN ALFA 60 MCG/0.3ML IJ SOSY
PREFILLED_SYRINGE | INTRAMUSCULAR | Status: AC
  Filled 2018-11-24: qty 0.3

## 2018-11-24 NOTE — Progress Notes (Signed)
[facility] Progress Note    Assessment/ Plan:   1. Acute Hypoxic respiratory failure: Requiring HFNC O2-->with CXR c/w pulmonary edema in setting of worsening edema. This has improved since starting dialysis and he's on RA now.   2. 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 Renal US was normal in 03/2018 and without obstruction now. HD ordered as above.   - Appreciate VVS placing a left Cimino - CLIP make take some time as he's only eligible for emergency medicaid (appreciate Colleen's efforts)  On MWF schedule -> next  HD today   3. Anemia: Suspect anemia of CKD. Iron deficient- ferrlicet with HD. Aranesp 60 q week (start 9/23)  4 HTN: Markedly hypertensive at admission. Now better. Resume home meds, BP better since beginning HD  5 BMM: Phos up(8 on 9/15), Started sevelamer 800 TID AC on 9/21 -> will recheck in AM. PTH OK   6 DM: Recent A1cs controlled; care per primary.   7. Elevated trop: 25-->168-->525 now, then flattened out. Per primary   8. Dispo: pending CLIP and perm access  Subjective:   Denies f/c/n/v. + BM.  No events overnight.   Objective:   BP (!) 141/76 (BP Location: Right Arm)   Pulse 66   Temp 98.7 F (37.1 C) (Oral)   Resp 20   Ht 5' 5.98" (1.676 m)   Wt 78.7 kg   SpO2 99%   BMI 28.02 kg/m   Intake/Output Summary (Last 24 hours) at 11/24/2018 0715 Last data filed at 11/23/2018 1847 Gross per 24 hour  Intake 1440 ml  Output -  Net 1440 ml   Weight change:   Physical Exam: Gen: older gentleman, NAD CVS: RRR no m/r/g Resp: on RA now Abd: abd fullness better Ext: no LE edema Access: RIJ temp, left Cimino good bruit  Imaging: Dg Chest [place] 1v Same Day  Result Date: 11/22/2018 CLINICAL DATA:  Postop right dialysis catheter placement. EXAM: PORTABLE CHEST 1 VIEW COMPARISON:  Radiographs 11/15/2018 and  03/10/2018. FINDINGS: 1556 hours. New right IJ hemodialysis catheter extends to the level of the right atrium. There is stable cardiomegaly. Pulmonary edema has resolved, although there is persistent vascular congestion and small bilateral pleural effusions. The overall aeration of the lung bases has improved with probable residual left lower lobe atelectasis. No pneumothorax. The bones appear unchanged. IMPRESSION: 1. Hemodialysis catheter tip overlies the right atrium. No pneumothorax. 2. Improved pulmonary edema with persistent bilateral pleural effusions. Electronically Signed   By: Richardean Sale M.D.   On: 11/22/2018 16:13   Dg Fluoro Guide Cv Line-no Report  Result Date: 11/22/2018 Fluoroscopy was utilized by the requesting physician.  No radiographic interpretation.    Labs: BMET Recent Labs  Lab 11/18/18 0239 11/19/18 0234 11/20/18 0322 11/21/18 0325 11/22/18 0309 11/22/18 1147  NA 133* 131* 131* 130* 127* 134*  K 3.5 4.0 4.1 4.5 4.6 3.2*  CL 98 96* 95* 94* 93*  --   CO2 22 23 23 23 22   --   GLUCOSE 113* 152* 127* 118* 120* 113*  BUN 40* 50* 30* 41* 53*  --   CREATININE 5.82* 7.52* 5.53* 6.79* 7.61*  --   CALCIUM 7.7* 7.6* 7.5* 7.8* 7.6*  --    CBC Recent Labs  Lab 11/17/18 2201  11/20/18 0322 11/20/18 1902 11/22/18 0309 11/22/18 1147 11/22/18 1822  WBC 5.5  --  4.7  --  6.2  --  4.8  HGB 7.1*   < > 7.3* 8.2* 8.8* 8.8* 8.9*  HCT 20.9*   < > 23.3* 25.1* 25.7* 26.0* 26.0*  MCV 86.4  --  88.6  --  86.2  --  87.2  PLT 190  --  188  --  180  --  178   < > = values in this interval not displayed.    Medications:    . amLODipine  10 mg Oral Daily  . aspirin EC  81 mg Oral Daily  . atorvastatin  40 mg Oral q1800  . Chlorhexidine Gluconate Cloth  6 each Topical Q0600  . heparin injection (subcutaneous)  5,000 Units Subcutaneous Q8H  . hydrALAZINE  50 mg Oral Q8H  . insulin aspart  0-5 Units Subcutaneous QHS  . insulin aspart  0-9 Units Subcutaneous TID WC  .  labetalol  100 mg Oral Daily  . sevelamer carbonate  800 mg Oral TID WC      Otelia Santee, MD 11/24/2018, 7:15 AM

## 2018-11-24 NOTE — Progress Notes (Signed)
Patient ID: Max Sanchez, male   DOB: 1965-05-02, 53 y.o.   MRN: [id number]  PROGRESS NOTE    Max Sanchez  [id number] DOB: Oct 21, 1965 DOA: 11/15/2018 PCP: Scot Jun, FNP   Brief Narrative:  53 year old male with history of hypertension, hyperlipidemia, diabetes mellitus type 2, unspecified CVA, chronic kidney disease stage IV, anemia, chronic diastolic CHF presented with worsening shortness of breath.  He was found to have acute on chronic CHF due to fluid overload caused by renal failure.  Nephrology was consulted.  He had to be subsequently started on dialysis.  Currently awaiting outpatient dialysis unit arrangement.  Assessment & Plan:   Acute kidney injury on chronic kidney disease stage V now progressed to ESRD -Nephrology following.  Underwent right IJ dialysis catheter placement, dialysis was started on 11/16/2018 evening -Status post left radiocephalic AV fistula creation by Dr. early on 11/22/2018 along with exchange of right IJ tunneled HD catheter for temporary catheter -Dialysis as per nephrology schedule.  Awaiting arrangement for outpatient dialysis unit prior to discharge.  Acute hypoxic respiratory failure due to fluid overload Acute on chronic diastolic CHF - Echo during this hospitalization showed EF of 60 to 65% -Respiratory failure has resolved.  Currently on room air.  Volume managed by dialysis.  Strict input and output.  Daily weights.  Fluid restriction.  Hypertension -Blood pressure stable.  Continue Norvasc, hydralazine and beta-blocker  Dyslipidemia -Continue statin  History of unspecified stroke -Continue aspirin and statin for secondary prevention  Troponin elevation -In non-ACS pattern due to stress from respiratory failure and CHF and renal failure. -Troponins did not elevate significantly.  Patient was chest pain-free and EKG was unremarkable.  Echo noted with preserved EF and normal wall motion abnormality.  Continue aspirin,  beta-blocker and statin  Anemia of chronic disease -Received 1 unit packed red cells on 11/20/2018.  Stable  Diabetes mellitus type 2 -Continue sliding scale insulin.   DVT prophylaxis: Heparin Code Status: Full Family Communication: None at bedside Disposition Plan: Home once outpatient dialysis arrangement has been made  Consultants: Nephrology/vascular surgery  Procedures:  Right IJ dialysis catheter placement on 11/16/2018   left radiocephalic AV fistula creation by Dr. early on 11/22/2018 along with exchange of right IJ tunneled HD catheter for temporary catheter  TTE -  1. The left ventricle has normal systolic function with an ejection fraction of 60-65%. The cavity size was mildly dilated. Left ventricular diastolic Doppler parameters are consistent with pseudonormalization. Elevated left ventricular end-diastolic pressure. 2. The right ventricle has normal systolic function. The cavity was normal. There is No increase in right ventricular wall thickness. 3. Left atrial size was severely dilated. 4. Right atrial size was normal. 5. Moderate pericardial effusion. 6. No evidence of mitral valve stenosis. 7. The aortic valve is tricuspid. Aortic valve regurgitation is mild by color flow Doppler. No stenosis of the aortic valve. 8. Pulmonic valve regurgitation is mild by color flow Doppler. 9. The aorta is normal unless otherwise noted. 10. The inferior vena cava was is dilated in size with >50% respiratory variability, suggesting right atrial pressure of 8 mmHg.  antimicrobials:  None  Subjective: Patient seen and examined at bedside.  He is sleepy, hardly wakes up on calling his name.  No overnight fever or vomiting reported.  Objective: Vitals:   11/23/18 1500 11/23/18 2054 11/23/18 2258 11/24/18 0619  BP: (!) 141/76 (!) 143/76 (!) 146/74 (!) 141/76  Pulse:  74 72 66  Resp:  16  20  Temp:  98.6 F (37 C)  98.7 F (37.1 C)  TempSrc:  Oral  Oral  SpO2:   99%  99%  Weight:      Height:        Intake/Output Summary (Last 24 hours) at 11/24/2018 1102 Last data filed at 11/24/2018 0941 Gross per 24 hour  Intake 1200 ml  Output --  Net 1200 ml   Filed Weights   11/22/18 1107 11/22/18 1141 11/23/18 0500  Weight: 76.8 kg 76.8 kg 78.7 kg    Examination:  General exam: Appears calm and comfortable.  Sleepy, hardly wakes up on calling his name. Respiratory system: Bilateral decreased breath sounds at bases with some scattered crackles Cardiovascular system: S1 & S2 heard, Rate controlled Gastrointestinal system: Abdomen is nondistended, soft and nontender. Normal bowel sounds heard. Extremities: No cyanosis, clubbing; trace edema   Data Reviewed: I have personally reviewed following labs and imaging studies  CBC: Recent Labs  Lab 11/17/18 2201  11/20/18 0322 11/20/18 1902 11/22/18 0309 11/22/18 1147 11/22/18 1822  WBC 5.5  --  4.7  --  6.2  --  4.8  HGB 7.1*   < > 7.3* 8.2* 8.8* 8.8* 8.9*  HCT 20.9*   < > 23.3* 25.1* 25.7* 26.0* 26.0*  MCV 86.4  --  88.6  --  86.2  --  87.2  PLT 190  --  188  --  180  --  178   < > = values in this interval not displayed.   Basic Metabolic Panel: Recent Labs  Lab 11/18/18 0239 11/19/18 0234 11/20/18 0322 11/21/18 0325 11/22/18 0309 11/22/18 1147  NA 133* 131* 131* 130* 127* 134*  K 3.5 4.0 4.1 4.5 4.6 3.2*  CL 98 96* 95* 94* 93*  --   CO2 22 23 23 23 22   --   GLUCOSE 113* 152* 127* 118* 120* 113*  BUN 40* 50* 30* 41* 53*  --   CREATININE 5.82* 7.52* 5.53* 6.79* 7.61*  --   CALCIUM 7.7* 7.6* 7.5* 7.8* 7.6*  --    GFR: Estimated Creatinine Clearance: 11.2 mL/min (A) (by C-G formula based on SCr of 7.61 mg/dL (H)). Liver Function Tests: No results for input(s): AST, ALT, ALKPHOS, BILITOT, PROT, ALBUMIN in the last 168 hours. No results for input(s): LIPASE, AMYLASE in the last 168 hours. No results for input(s): AMMONIA in the last 168 hours. Coagulation Profile: No results for  input(s): INR, PROTIME in the last 168 hours. Cardiac Enzymes: No results for input(s): CKTOTAL, CKMB, CKMBINDEX, TROPONINI in the last 168 hours. BNP (last 3 results) No results for input(s): PROBNP in the last 8760 hours. HbA1C: No results for input(s): HGBA1C in the last 72 hours. CBG: Recent Labs  Lab 11/23/18 0804 11/23/18 1208 11/23/18 1653 11/23/18 2052 11/24/18 0737  GLUCAP 118* 187* 177* 188* 137*   Lipid Profile: No results for input(s): CHOL, HDL, LDLCALC, TRIG, CHOLHDL, LDLDIRECT in the last 72 hours. Thyroid Function Tests: No results for input(s): TSH, T4TOTAL, FREET4, T3FREE, THYROIDAB in the last 72 hours. Anemia Panel: No results for input(s): VITAMINB12, FOLATE, FERRITIN, TIBC, IRON, RETICCTPCT in the last 72 hours. Sepsis Labs: No results for input(s): PROCALCITON, LATICACIDVEN in the last 168 hours.  Recent Results (from the past 240 hour(s))  SARS Coronavirus 2 [facility] order, Performed in [facility] hospital lab) Nasopharyngeal Nasopharyngeal Swab     Status: None   Collection Time: 11/15/18 11:15 PM   Specimen: Nasopharyngeal Swab  Result Value Ref Range Status  SARS Coronavirus 2 NEGATIVE NEGATIVE Final    Comment: (NOTE) If result is NEGATIVE SARS-CoV-2 target nucleic acids are NOT DETECTED. The SARS-CoV-2 RNA is generally detectable in upper and lower  respiratory specimens during the acute phase of infection. The lowest  concentration of SARS-CoV-2 viral copies this assay can detect is 250  copies / mL. A negative result does not preclude SARS-CoV-2 infection  and should not be used as the sole basis for treatment or other  patient management decisions.  A negative result may occur with  improper specimen collection / handling, submission of specimen other  than nasopharyngeal swab, presence of viral mutation(s) within the  areas targeted by this assay, and inadequate number of viral copies  (<250 copies / mL). A negative result must be  combined with clinical  observations, patient history, and epidemiological information. If result is POSITIVE SARS-CoV-2 target nucleic acids are DETECTED. The SARS-CoV-2 RNA is generally detectable in upper and lower  respiratory specimens dur ing the acute phase of infection.  Positive  results are indicative of active infection with SARS-CoV-2.  Clinical  correlation with patient history and other diagnostic information is  necessary to determine patient infection status.  Positive results do  not rule out bacterial infection or co-infection with other viruses. If result is PRESUMPTIVE POSTIVE SARS-CoV-2 nucleic acids MAY BE PRESENT.   A presumptive positive result was obtained on the submitted specimen  and confirmed on repeat testing.  While 2019 novel coronavirus  (SARS-CoV-2) nucleic acids may be present in the submitted sample  additional confirmatory testing may be necessary for epidemiological  and / or clinical management purposes  to differentiate between  SARS-CoV-2 and other Sarbecovirus currently known to infect humans.  If clinically indicated additional testing with an alternate test  methodology [phone]) is advised. The SARS-CoV-2 RNA is generally  detectable in upper and lower respiratory sp ecimens during the acute  phase of infection. The expected result is Negative. Fact Sheet for Patients:  StrictlyIdeas.no Fact Sheet for Healthcare Providers: BankingDealers.co.za This test is not yet approved or cleared by the [place] FDA and has been authorized for detection and/or diagnosis of SARS-CoV-2 by FDA under an Emergency Use Authorization (EUA).  This EUA will remain in effect (meaning this test can be used) for the duration of the COVID-19 declaration under Section 564(b)(1) of the Act, 21 U.S.C. section 360bbb-3(b)(1), unless the authorization is terminated or revoked sooner. Performed at [place], [place] [address] [place], [place] [address]          Radiology Studies: Dg Chest [place] 1v Same Day  Result Date: 11/22/2018 CLINICAL DATA:  Postop right dialysis catheter placement. EXAM: PORTABLE CHEST 1 VIEW COMPARISON:  Radiographs 11/15/2018 and 03/10/2018. FINDINGS: 1556 hours. New right IJ hemodialysis catheter extends to the level of the right atrium. There is stable cardiomegaly. Pulmonary edema has resolved, although there is persistent vascular congestion and small bilateral pleural effusions. The overall aeration of the lung bases has improved with probable residual left lower lobe atelectasis. No pneumothorax. The bones appear unchanged. IMPRESSION: 1. Hemodialysis catheter tip overlies the right atrium. No pneumothorax. 2. Improved pulmonary edema with persistent bilateral pleural effusions. Electronically Signed   By: Richardean Sale M.D.   On: 11/22/2018 16:13   Dg Fluoro Guide Cv Line-no Report  Result Date: 11/22/2018 Fluoroscopy was utilized by the requesting physician.  No radiographic interpretation.        Scheduled Meds:  amLODipine  10 mg Oral  Daily   aspirin EC  81 mg Oral Daily   atorvastatin  40 mg Oral q1800   Chlorhexidine Gluconate Cloth  6 each Topical Q0600   darbepoetin (ARANESP) injection - DIALYSIS  60 mcg Intravenous Q Wed-HD   heparin injection (subcutaneous)  5,000 Units Subcutaneous Q8H   hydrALAZINE  50 mg Oral Q8H   insulin aspart  0-5 Units Subcutaneous QHS   insulin aspart  0-9 Units Subcutaneous TID WC   labetalol  100 mg Oral Daily   sevelamer carbonate  800 mg Oral TID WC   Continuous Infusions:  sodium chloride     sodium chloride 10 mL/hr at 11/22/18 1208   ferric gluconate (FERRLECIT/NULECIT) IV Stopped (11/20/18 1343)     LOS: 8 days        Aline August, MD [facility] 11/24/2018, 11:02 AM

## 2018-11-25 LAB — PHOSPHORUS: Phosphorus: 2.7 mg/dL (ref 2.5–4.6)

## 2018-11-25 LAB — GLUCOSE, CAPILLARY
Glucose-Capillary: 220 mg/dL — ABNORMAL HIGH (ref 70–99)
Glucose-Capillary: 229 mg/dL — ABNORMAL HIGH (ref 70–99)

## 2018-11-25 MED ORDER — ASPIRIN 81 MG PO TBEC: 81.0000 mg | DELAYED_RELEASE_TABLET | Freq: Every day | ORAL | 0 refills | Status: DC

## 2018-11-25 MED ORDER — DARBEPOETIN ALFA 100 MCG/0.5ML IJ SOSY: 100.0000 ug | PREFILLED_SYRINGE | INTRAMUSCULAR | Status: DC

## 2018-11-25 MED ORDER — LABETALOL HCL 100 MG PO TABS: 100.0000 mg | ORAL_TABLET | Freq: Every day | ORAL | 0 refills | Status: DC

## 2018-11-25 MED ORDER — HYDRALAZINE HCL 50 MG PO TABS: 50.0000 mg | ORAL_TABLET | Freq: Three times a day (TID) | ORAL | 0 refills | Status: DC

## 2018-11-25 MED ORDER — SEVELAMER CARBONATE 800 MG PO TABS: 800.0000 mg | ORAL_TABLET | Freq: Three times a day (TID) | ORAL | 0 refills | Status: DC

## 2018-11-25 MED ORDER — NITROGLYCERIN 0.4 MG SL SUBL: 0.4000 mg | SUBLINGUAL_TABLET | SUBLINGUAL | 0 refills | Status: DC | PRN

## 2018-11-25 MED ORDER — AMLODIPINE BESYLATE 10 MG PO TABS: 10.0000 mg | ORAL_TABLET | Freq: Every day | ORAL | 0 refills | Status: DC

## 2018-11-25 NOTE — TOC Initial Note (Signed)
Transition of Care [facility]) - Initial/Assessment Note    Patient Details  Name: Max Sanchez MRN: [id number] Date of Birth: Oct 06, 1965  [facility] CM/SW Contact:    Benard Halsted, LCSW Phone Number: 11/25/2018, 12:26 PM  Clinical Narrative:                 Patient discharging home today and is aware of his dialysis schedule. Financial Counseling reported that she spoke with patient's daughter to confirm info and work on getting patient set up with retro-Medicaid. No other needs identified at this time.   Expected Discharge Plan: Home/Self Care Barriers to Discharge: No Barriers Identified   Patient Goals and CMS Choice Patient states their goals for this hospitalization and ongoing recovery are:: Return home CMS Medicare.gov Compare Post Acute Care list provided to:: Patient Choice offered to / list presented to : Patient  Expected Discharge Plan and Services Expected Discharge Plan: Home/Self Care In-house Referral: Interpreting Services, Financial Counselor   Post Acute Care Choice: Dialysis Living arrangements for the past 2 months: Single Family Home Expected Discharge Date: 11/25/18                         [facility] Arranged: NA          Prior Living Arrangements/Services Living arrangements for the past 2 months: Single Family Home Lives with:: Adult Children Patient language and need for interpreter reviewed:: Yes(Spanish Speaking) Do you feel safe going back to the place where you live?: Yes      Need for Family Participation in Patient Care: Yes (Comment) Care giver support system in place?: Yes (comment)   Criminal Activity/Legal Involvement Pertinent to Current Situation/Hospitalization: No - Comment as needed  Activities of Daily Living Home Assistive Devices/Equipment: None ADL Screening (condition at time of admission) Patient's cognitive ability adequate to safely complete daily activities?: Yes Is the patient deaf or have difficulty  hearing?: No Does the patient have difficulty seeing, even when wearing glasses/contacts?: No Does the patient have difficulty concentrating, remembering, or making decisions?: No Patient able to express need for assistance with ADLs?: Yes Does the patient have difficulty dressing or bathing?: No Independently performs ADLs?: Yes (appropriate for developmental age) Does the patient have difficulty walking or climbing stairs?: No Weakness of Legs: None Weakness of Arms/Hands: None  Permission Sought/Granted                  Emotional Assessment Appearance:: Appears stated age Attitude/Demeanor/Rapport: Gracious Affect (typically observed): Quiet Orientation: : Oriented to Self, Oriented to Place, Oriented to  Time, Oriented to Situation Alcohol / Substance Use: Not Applicable Psych Involvement: No (comment)  Admission diagnosis:  AKI (acute kidney injury) ([place]) [N17.9] Hypervolemia, unspecified hypervolemia type [E87.70] Acute renal failure superimposed on chronic kidney disease, unspecified CKD stage, unspecified acute renal failure type ([place]) [N17.9, N18.9] Patient Active Problem List   Diagnosis Date Noted  . Type II diabetes mellitus with renal manifestations ([place]) 11/16/2018  . ESRD (end stage renal disease) ([place]) 11/16/2018  . Hypertensive urgency 11/16/2018  . Fluid overload 11/16/2018  . Acute respiratory failure with hypoxia ([place]) 11/16/2018  . Acute on chronic diastolic (congestive) heart failure ([place]) 11/16/2018  . Elevated troponin 11/16/2018  . Anemia in CKD (chronic kidney disease) 11/16/2018  . Acute renal failure superimposed on stage 4 chronic kidney disease ([place])   . Stroke ([place]) 04/22/2018  . Hyperkalemia 04/22/2018  . Hyperlipidemia 03/19/2018  . Insomnia 03/19/2018  .  Hypertension 03/10/2018  . CKD (chronic kidney disease), stage III ([place]) 03/10/2018  . Acute-on-chronic kidney injury ([place]) 03/10/2018  . Proteinuria 03/10/2018  . Prolonged Q-T  interval on ECG 03/10/2018  . History of CVA (cerebrovascular accident) 03/10/2018   PCP:  Scot Jun, FNP Pharmacy:   [id number] [place], [place] [place] [address] Phone: [phone] Fax: [place], [place] [address] [place] [address] Phone: [phone] Fax: [phone]     Social Determinants of Health (SDOH) Interventions    Readmission Risk Interventions Readmission Risk Prevention Plan 11/25/2018  Transportation Screening Complete  PCP or Specialist Appt within 3-5 Days Complete  HRI or [place] Complete  Social Work Consult for [place] Planning/Counseling Complete  Palliative Care Screening Not Applicable  Medication Review Press photographer) Complete  Some recent data might be hidden

## 2018-11-25 NOTE — Progress Notes (Signed)
[facility] Progress Note    Assessment/ Plan:   1. Acute Hypoxic respiratory failure: Requiring HFNC O2-->with CXR c/w pulmonary edema in setting of worsening edema. This has improved since starting dialysis and he's on RA now.   2. 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 Renal US was normal in 03/2018 and without obstruction now. HD ordered as above.  - Appreciate VVS placing a left Cimino 9/22 -CLIPmake take some time as he's only eligible for emergency medicaid (appreciate Colleen's efforts) --> looks like we are making progress here. Hopefully we will have a chair in the next few days. Pt also asking to go home.  On MWF schedule; 2.5L net UF 9/23 -> nextHD Fri.   3. Anemia: Suspect anemia of CKD. Iron deficient- ferrlicet with HD. Aranesp 60 q week (start 9/23) -> incr to 100 starting next week.   Transfuse as needed.   4 HTN: Markedly hypertensive at admission. Now better. Resume home meds, BP better since beginning HD  5 BMM: Phos up(8 on 9/15),Startedsevelamer 800 TID AC on 9/21 -> will recheck (2.7 9/24) -> will recheck in a few days  PTH OK   6 DM: Recent A1cs controlled; care per primary.   7. Elevated trop: 25-->168-->525 now, then flattened out. Per primary   8. Dispo: pending CLIP and perm access  Subjective:   Denies f/c/n/v. + BM.  No events overnight.   Objective:   BP (!) 153/81 (BP Location: Right Arm)   Pulse 69   Temp 98.9 F (37.2 C)   Resp 18   Ht 5' 5.98" (1.676 m)   Wt 78 kg   SpO2 99%   BMI 27.77 kg/m   Intake/Output Summary (Last 24 hours) at 11/25/2018 0748 Last data filed at 11/25/2018 0600 Gross per 24 hour  Intake 300 ml  Output 2519 ml  Net -2219 ml   Weight change:   Physical Exam: Gen: older gentleman, NAD CVS: RRR no m/r/g Resp: on RA now Abd: abd fullness better Ext: no LE  edema Access: RIJ temp, left Cimino good bruit  Imaging: No results found.  Labs: BMET Recent Labs  Lab 11/19/18 0234 11/20/18 0322 11/21/18 0325 11/22/18 0309 11/22/18 1147 11/24/18 1120 11/24/18 1251 11/25/18 0240  NA 131* 131* 130* 127* 134* 129* 130*  --   K 4.0 4.1 4.5 4.6 3.2* 4.0 3.9  --   CL 96* 95* 94* 93*  --  94* 95*  --   CO2 23 23 23 22   --  24 26  --   GLUCOSE 152* 127* 118* 120* 113* 220* 197*  --   BUN 50* 30* 41* 53*  --  46* 46*  --   CREATININE 7.52* 5.53* 6.79* 7.61*  --  6.63* 6.70*  --   CALCIUM 7.6* 7.5* 7.8* 7.6*  --  7.5* 7.6*  --   PHOS  --   --   --   --   --  4.7* 4.6 2.7   CBC Recent Labs  Lab 11/22/18 0309 11/22/18 1147 11/22/18 1822 11/24/18 1120 11/24/18 1250  WBC 6.2  --  4.8 6.2 5.8  HGB 8.8* 8.8* 8.9* 8.2* 8.0*  HCT 25.7* 26.0* 26.0* 24.1* 23.3*  MCV 86.2  --  87.2 87.3 88.6  PLT 180  --  178 182 182    Medications:    . amLODipine  10 mg Oral Daily  .  aspirin EC  81 mg Oral Daily  . atorvastatin  40 mg Oral q1800  . Chlorhexidine Gluconate Cloth  6 each Topical Q0600  . darbepoetin (ARANESP) injection - DIALYSIS  60 mcg Intravenous Q Wed-HD  . heparin injection (subcutaneous)  5,000 Units Subcutaneous Q8H  . hydrALAZINE  50 mg Oral Q8H  . insulin aspart  0-5 Units Subcutaneous QHS  . insulin aspart  0-9 Units Subcutaneous TID WC  . labetalol  100 mg Oral Daily  . sevelamer carbonate  800 mg Oral TID WC      Otelia Santee, MD 11/25/2018, 7:48 AM

## 2018-11-25 NOTE — Progress Notes (Signed)
Patient has been accepted for OP HD treatment at [facility] clinic on a TTS schedule with a seat time of 12:40pm. He needs to arrive at 12:20pm for his appointments. Plan for discharge today. Patient will start in the clinic on Saturday, 11/27/18. Patient needs to report to the clinic tomorrow, Friday, 11/26/18 to sign consents.  Alphonzo Cruise, [place] [facility]al Navigator [phone]

## 2018-11-25 NOTE — Progress Notes (Signed)
Renal Navigator met with patient with assistance of Spanish Interpreter to inform him of his OP HD schedule and inform him that he will start in the clinic on Saturday. Patient instructed to go to the clinic tomorrow to sign consents. Renal Navigator used teach back technique to ensure understanding. Renal Navigator attempted to reach patient's daughter to provide information to her also, but she did not answer and no option to leave message.  Alphonzo Cruise, [place] Renal Navigator [phone]

## 2018-11-25 NOTE — Discharge Summary (Signed)
Physician Discharge Summary  Max Sanchez [id number] DOB: 05-24-1965 DOA: 11/15/2018  PCP: Scot Jun, FNP  Admit date: 11/15/2018 Discharge date: 11/25/2018  Admitted From: Home Disposition: Home  Recommendations for Outpatient Follow-up:  1. Follow up with PCP in 1 week with repeat CBC/BMP 2. Outpatient follow up with dialysis unit as scheduled. Outpatient followup with vascular surgery. 3. Follow up in ED if symptoms worsen or new appear   Home Health: No Equipment/Devices: None  Discharge Condition: Stable CODE STATUS: Full Diet recommendation: Heart healthy/Carb modified/renal hemodialysis diet  Brief/Interim Summary: 53 year old male with history of hypertension, hyperlipidemia, diabetes mellitus type 2, unspecified CVA, chronic kidney disease stage IV, anemia, chronic diastolic CHF presented with worsening shortness of breath.  He was found to have acute on chronic CHF due to fluid overload caused by renal failure.  Nephrology was consulted.  He had to be subsequently started on dialysis.  Currently awaiting outpatient dialysis unit arrangement. His outpatient dialysis unit has been arranged. He will be discharged home today.  Discharge Diagnoses:   Acute kidney injury on chronic kidney disease stage V now progressed to ESRD -Nephrology following.  Underwent right IJ dialysis catheter placement, dialysis was started on 11/16/2018 evening -Status post left radiocephalic AV fistula creation by Dr. Donnetta Hutching on 11/22/2018 along with exchange of right IJ tunneled HD catheter for temporary catheter -Dialysis as per nephrology schedule. -Patient is currently medically stable for discharge. -His outpatient dialysis unit has been arranged. He will be discharged home today.   Acute hypoxic respiratory failure due to fluid overload Acute on chronic diastolic CHF - Echo during this hospitalization showed EF of 60 to 65% -Respiratory failure has resolved.  Currently on room  air.  Volume managed by dialysis.    Hypertension -Blood pressure stable.  Continue Norvasc, hydralazine and beta-blocker  Dyslipidemia -Continue statin  History of unspecified stroke -Continue aspirin and statin for secondary prevention  Troponin elevation -In non-ACS pattern due to stress from respiratory failure and CHF and renal failure. -Troponins did not elevate significantly.  Patient was chest pain-free and EKG was unremarkable.  Echo noted with preserved EF and normal wall motion abnormality.  Continue aspirin, beta-blocker and statin  Anemia of chronic disease -Received 1 unit packed red cells on 11/20/2018.  Hemoglobin stable  Diabetes mellitus type 2 -Continue outpatient regimen. Carb modified diet.   Discharge Instructions  Discharge Instructions    Ambulatory referral to Vascular Surgery   Complete by: As directed    Diet - low sodium heart healthy   Complete by: As directed    Diet Carb Modified   Complete by: As directed    Increase activity slowly   Complete by: As directed      Allergies as of 11/25/2018   No Known Allergies     Medication List    STOP taking these medications   gabapentin 100 MG capsule Commonly known as: NEURONTIN     TAKE these medications   amLODipine 10 MG tablet Commonly known as: NORVASC Take 1 tablet (10 mg total) by mouth daily. Start taking on: November 26, 2018 What changed:   medication strength  how much to take   aspirin 81 MG EC tablet Take 1 tablet (81 mg total) by mouth daily. Start taking on: November 26, 2018   atorvastatin 40 MG tablet Commonly known as: LIPITOR Take 1 tablet (40 mg total) by mouth daily at 6 PM.   glipiZIDE 5 MG 24 hr tablet Commonly known as: GLUCOTROL XL Take  1 tablet (5 mg total) by mouth daily with breakfast.   hydrALAZINE 50 MG tablet Commonly known as: APRESOLINE Take 1 tablet (50 mg total) by mouth every 8 (eight) hours. What changed:   medication  strength  when to take this   labetalol 100 MG tablet Commonly known as: NORMODYNE Take 1 tablet (100 mg total) by mouth daily. Start taking on: November 26, 2018 What changed:   medication strength  how much to take  when to take this   nitroGLYCERIN 0.4 MG SL tablet Commonly known as: NITROSTAT Place 1 tablet (0.4 mg total) under the tongue every 5 (five) minutes as needed for chest pain.   sevelamer carbonate 800 MG tablet Commonly known as: RENVELA Take 1 tablet (800 mg total) by mouth 3 (three) times daily with meals.      Follow-up Information    Early, Arvilla Meres, MD In 6 weeks.   Specialties: Vascular Surgery, Cardiology Why: Office will call you to arrange your appt (sent) Contact information: [place] [address] [phone]        Scot Jun, FNP. Schedule an appointment as soon as possible for a visit in 1 week(s).   Specialty: Family Medicine Contact information: [address] [place] [address] [phone]          No Known Allergies  Consultations: Nephrology/vascular surgery   Procedures/Studies: US Renal  Result Date: 11/16/2018 CLINICAL DATA:  AKI with elevated BUN and creatinine EXAM: RENAL / URINARY TRACT ULTRASOUND COMPLETE COMPARISON:  Ultrasound 03/10/2018 FINDINGS: Right Kidney: Renal measurements: 10.3 x 4.6 x 4.7 cm = volume: 114 mL . Echogenicity within normal limits. No mass or hydronephrosis visualized. Left Kidney: Renal measurements: 9.4 x 5.6 x 4.6 cm = volume: 125 mL. Echogenicity within normal limits. No mass or hydronephrosis visualized. Previously seen anechoic cyst in the interpolar region is no longer visualized. Bladder: Appears normal for degree of bladder distention. Other: Bilateral pleural effusions. IMPRESSION: Incidentally noted bilateral pleural effusions. Previously seen left renal cyst no longer visualized. Otherwise unremarkable renal ultrasound. Electronically Signed   By:  Lovena Le M.D.   On: 11/16/2018 02:21   Ir Fluoro Guide Cv Line Right  Result Date: 11/17/2018 CLINICAL DATA:  Acute on chronic kidney disease EXAM: EXAM RIGHT IJ CATHETER PLACEMENT UNDER ULTRASOUND AND FLUOROSCOPIC GUIDANCE TECHNIQUE: The procedure, risks (including but not limited to bleeding, infection, organ damage, pneumothorax), benefits, and alternatives were explained to the patient. Questions regarding the procedure were encouraged and answered. The patient understands and consents to the procedure. Patency of the right IJ vein was confirmed with ultrasound with image documentation. An appropriate skin site was determined. Skin site was marked. Region was prepped using maximum barrier technique including cap and mask, sterile gown, sterile gloves, large sterile sheet, and Chlorhexidine as cutaneous antisepsis. The region was infiltrated locally with 1% lidocaine. Under real-time ultrasound guidance, the right IJ vein was accessed with a 19 gauge needle; the needle tip within the vein was confirmed with ultrasound image documentation. The needle exchanged over a guidewire for vascular dilator which allowed advancement of a 16 cm Trialysis catheter. This was positioned with the tip at the cavoatrial junction. Spot chest radiograph shows good positioning and no pneumothorax. Catheter was flushed and sutured externally with 0-Prolene sutures. Patient tolerated the procedure well. FLUOROSCOPY TIME:  26 seconds, 1 image was saved. COMPLICATIONS: COMPLICATIONS none IMPRESSION: Technically successful right IJ Trialysis catheter placement. The catheter is ready for immediate use as  clinically indicated. Electronically Signed   By: Constance Holster M.D.   On: 11/17/2018 08:04   Ir US Guide Vasc Access Right  Result Date: 11/17/2018 CLINICAL DATA:  Acute on chronic kidney disease EXAM: EXAM RIGHT IJ CATHETER PLACEMENT UNDER ULTRASOUND AND FLUOROSCOPIC GUIDANCE TECHNIQUE: The procedure, risks (including  but not limited to bleeding, infection, organ damage, pneumothorax), benefits, and alternatives were explained to the patient. Questions regarding the procedure were encouraged and answered. The patient understands and consents to the procedure. Patency of the right IJ vein was confirmed with ultrasound with image documentation. An appropriate skin site was determined. Skin site was marked. Region was prepped using maximum barrier technique including cap and mask, sterile gown, sterile gloves, large sterile sheet, and Chlorhexidine as cutaneous antisepsis. The region was infiltrated locally with 1% lidocaine. Under real-time ultrasound guidance, the right IJ vein was accessed with a 19 gauge needle; the needle tip within the vein was confirmed with ultrasound image documentation. The needle exchanged over a guidewire for vascular dilator which allowed advancement of a 16 cm Trialysis catheter. This was positioned with the tip at the cavoatrial junction. Spot chest radiograph shows good positioning and no pneumothorax. Catheter was flushed and sutured externally with 0-Prolene sutures. Patient tolerated the procedure well. FLUOROSCOPY TIME:  26 seconds, 1 image was saved. COMPLICATIONS: COMPLICATIONS none IMPRESSION: Technically successful right IJ Trialysis catheter placement. The catheter is ready for immediate use as clinically indicated. Electronically Signed   By: Constance Holster M.D.   On: 11/17/2018 08:04   Dg Chest Port 1 View  Result Date: 11/15/2018 CLINICAL DATA:  Shortness of breath EXAM: PORTABLE CHEST 1 VIEW COMPARISON:  03/10/2018 FINDINGS: Cardiac shadow remains mildly enlarged. Diffuse vascular congestion is noted with interstitial edema and bilateral effusions. Bibasilar atelectasis is noted as well. IMPRESSION: Changes consistent with CHF with bibasilar atelectasis. Electronically Signed   By: Inez Catalina M.D.   On: 11/15/2018 23:20   Dg Chest Port 1v Same Day  Result Date:  11/22/2018 CLINICAL DATA:  Postop right dialysis catheter placement. EXAM: PORTABLE CHEST 1 VIEW COMPARISON:  Radiographs 11/15/2018 and 03/10/2018. FINDINGS: 1556 hours. New right IJ hemodialysis catheter extends to the level of the right atrium. There is stable cardiomegaly. Pulmonary edema has resolved, although there is persistent vascular congestion and small bilateral pleural effusions. The overall aeration of the lung bases has improved with probable residual left lower lobe atelectasis. No pneumothorax. The bones appear unchanged. IMPRESSION: 1. Hemodialysis catheter tip overlies the right atrium. No pneumothorax. 2. Improved pulmonary edema with persistent bilateral pleural effusions. Electronically Signed   By: Richardean Sale M.D.   On: 11/22/2018 16:13   Dg Fluoro Guide Cv Line-no Report  Result Date: 11/22/2018 Fluoroscopy was utilized by the requesting physician.  No radiographic interpretation.   Vas [place] Upper Ext Vein Mapping (pre-op Avf)  Result Date: 11/16/2018 UPPER EXTREMITY VEIN MAPPING  Indications: Pre-access. Comparison Study: no prior Performing Technologist: Abram Sander RVS  Examination Guidelines: A complete evaluation includes B-mode imaging, spectral Doppler, color Doppler, and power Doppler as needed of all accessible portions of each vessel. Bilateral testing is considered an integral part of a complete examination. Limited examinations for reoccurring indications may be performed as noted. +-----------------+-------------+----------+--------+ Right Cephalic   Diameter (cm)Depth (cm)Findings +-----------------+-------------+----------+--------+ Shoulder             0.63        0.62            +-----------------+-------------+----------+--------+ Prox upper arm  0.59        0.51            +-----------------+-------------+----------+--------+ Mid upper arm        0.50        0.26            +-----------------+-------------+----------+--------+ Dist  upper arm       0.47        0.28            +-----------------+-------------+----------+--------+ Antecubital fossa    0.64        0.38            +-----------------+-------------+----------+--------+ Prox forearm         0.50        0.35            +-----------------+-------------+----------+--------+ Mid forearm          0.37        0.26            +-----------------+-------------+----------+--------+ Dist forearm         0.32        0.19            +-----------------+-------------+----------+--------+ +-----------------+-------------+----------+--------+ Right Basilic    Diameter (cm)Depth (cm)Findings +-----------------+-------------+----------+--------+ Prox upper arm       0.41        1.43            +-----------------+-------------+----------+--------+ Mid upper arm        0.51        1.24            +-----------------+-------------+----------+--------+ Dist upper arm       0.54        0.63            +-----------------+-------------+----------+--------+ Antecubital fossa    0.65        0.59            +-----------------+-------------+----------+--------+ Prox forearm         0.26        0.23            +-----------------+-------------+----------+--------+ Mid forearm          0.24        0.22            +-----------------+-------------+----------+--------+ Distal forearm       0.27        0.26            +-----------------+-------------+----------+--------+ +-----------------+-------------+----------+--------------+ Left Cephalic    Diameter (cm)Depth (cm)   Findings    +-----------------+-------------+----------+--------------+ Shoulder             0.31        0.39     branching    +-----------------+-------------+----------+--------------+ Prox upper arm       0.12        0.21     branching    +-----------------+-------------+----------+--------------+ Mid upper arm                           not visualized  +-----------------+-------------+----------+--------------+ Dist upper arm                          not visualized +-----------------+-------------+----------+--------------+ Antecubital fossa                       not visualized +-----------------+-------------+----------+--------------+ Prox forearm  not visualized +-----------------+-------------+----------+--------------+ Mid forearm                             not visualized +-----------------+-------------+----------+--------------+ Dist forearm                            not visualized +-----------------+-------------+----------+--------------+ Wrist                                   not visualized +-----------------+-------------+----------+--------------+ +-----------------+-------------+----------+--------+ Left Basilic     Diameter (cm)Depth (cm)Findings +-----------------+-------------+----------+--------+ Prox upper arm       0.91        1.10            +-----------------+-------------+----------+--------+ Mid upper arm        0.77        1.27            +-----------------+-------------+----------+--------+ Dist upper arm       0.64        0.50            +-----------------+-------------+----------+--------+ Antecubital fossa    0.76        0.59            +-----------------+-------------+----------+--------+ Prox forearm         0.17        0.18            +-----------------+-------------+----------+--------+ Mid forearm          0.20        0.20            +-----------------+-------------+----------+--------+ Distal forearm       0.21        0.19            +-----------------+-------------+----------+--------+ *See table(s) above for measurements and observations.  Diagnosing physician: Deitra Mayo MD Electronically signed by Deitra Mayo MD on 11/16/2018 at 2:52:19 PM.    Final      Right IJ dialysis catheter placement on 11/16/2018    left radiocephalic AV fistula creation by Dr. early on 11/22/2018 along with exchange of right IJ tunneled HD catheter for temporary catheter  TTE -  1. The left ventricle has normal systolic function with an ejection fraction of 60-65%. The cavity size was mildly dilated. Left ventricular diastolic Doppler parameters are consistent with pseudonormalization. Elevated left ventricular end-diastolic pressure. 2. The right ventricle has normal systolic function. The cavity was normal. There is No increase in right ventricular wall thickness. 3. Left atrial size was severely dilated. 4. Right atrial size was normal. 5. Moderate pericardial effusion. 6. No evidence of mitral valve stenosis. 7. The aortic valve is tricuspid. Aortic valve regurgitation is mild by color flow Doppler. No stenosis of the aortic valve. 8. Pulmonic valve regurgitation is mild by color flow Doppler. 9. The aorta is normal unless otherwise noted. 10. The inferior vena cava was is dilated in size with >50% respiratory variability, suggesting right atrial pressure of 8 mmHg.  Subjective: Patient seen and examined at bedside.  Denies any overnight fever, nausea, vomiting, worsening shortness of breath.  Discharge Exam: Vitals:   11/24/18 2059 11/25/18 0504  BP: 135/63 (!) 153/81  Pulse: 79 69  Resp:    Temp: 98.8 F (37.1 C) 98.9 F (37.2 C)  SpO2: 99% 99%    General exam: No acute distress.  Respiratory system: Bilateral decreased breath sounds at bases with scattered crackles  cardiovascular system: Rate controlled, scattered Gastrointestinal system: Abdomen is nondistended, soft and nontender. Normal bowel sounds heard. Extremities: No cyanosis; trace edema    The results of significant diagnostics from this hospitalization (including imaging, microbiology, ancillary and laboratory) are listed below for reference.     Microbiology: Recent Results (from the past 240 hour(s))  SARS Coronavirus  2 [facility] order, Performed in [facility] hospital lab) Nasopharyngeal Nasopharyngeal Swab     Status: None   Collection Time: 11/15/18 11:15 PM   Specimen: Nasopharyngeal Swab  Result Value Ref Range Status   SARS Coronavirus 2 NEGATIVE NEGATIVE Final    Comment: (NOTE) If result is NEGATIVE SARS-CoV-2 target nucleic acids are NOT DETECTED. The SARS-CoV-2 RNA is generally detectable in upper and lower  respiratory specimens during the acute phase of infection. The lowest  concentration of SARS-CoV-2 viral copies this assay can detect is 250  copies / mL. A negative result does not preclude SARS-CoV-2 infection  and should not be used as the sole basis for treatment or other  patient management decisions.  A negative result may occur with  improper specimen collection / handling, submission of specimen other  than nasopharyngeal swab, presence of viral mutation(s) within the  areas targeted by this assay, and inadequate number of viral copies  (<250 copies / mL). A negative result must be combined with clinical  observations, patient history, and epidemiological information. If result is POSITIVE SARS-CoV-2 target nucleic acids are DETECTED. The SARS-CoV-2 RNA is generally detectable in upper and lower  respiratory specimens dur ing the acute phase of infection.  Positive  results are indicative of active infection with SARS-CoV-2.  Clinical  correlation with patient history and other diagnostic information is  necessary to determine patient infection status.  Positive results do  not rule out bacterial infection or co-infection with other viruses. If result is PRESUMPTIVE POSTIVE SARS-CoV-2 nucleic acids MAY BE PRESENT.   A presumptive positive result was obtained on the submitted specimen  and confirmed on repeat testing.  While 2019 novel coronavirus  (SARS-CoV-2) nucleic acids may be present in the submitted sample  additional confirmatory testing may be necessary for  epidemiological  and / or clinical management purposes  to differentiate between  SARS-CoV-2 and other Sarbecovirus currently known to infect humans.  If clinically indicated additional testing with an alternate test  methodology [phone]) is advised. The SARS-CoV-2 RNA is generally  detectable in upper and lower respiratory sp ecimens during the acute  phase of infection. The expected result is Negative. Fact Sheet for Patients:  StrictlyIdeas.no Fact Sheet for Healthcare Providers: BankingDealers.co.za This test is not yet approved or cleared by the [place] FDA and has been authorized for detection and/or diagnosis of SARS-CoV-2 by FDA under an Emergency Use Authorization (EUA).  This EUA will remain in effect (meaning this test can be used) for the duration of the COVID-19 declaration under Section 564(b)(1) of the Act, 21 U.S.C. section 360bbb-3(b)(1), unless the authorization is terminated or revoked sooner. Performed at [facility], [place] [address] [place], [place] [address]      Labs: BNP (last 3 results) Recent Labs    11/15/18 2304  BNP [id number]*   Basic Metabolic Panel: Recent Labs  Lab 11/20/18 0322 11/21/18 0325 11/22/18 0309 11/22/18 1147 11/24/18 1120 11/24/18 1251 11/25/18 0240  NA 131* 130* 127* 134* 129* 130*  --   K 4.1 4.5 4.6 3.2*  4.0 3.9  --   CL 95* 94* 93*  --  94* 95*  --   CO2 23 23 22   --  24 26  --   GLUCOSE 127* 118* 120* 113* 220* 197*  --   BUN 30* 41* 53*  --  46* 46*  --   CREATININE 5.53* 6.79* 7.61*  --  6.63* 6.70*  --   CALCIUM 7.5* 7.8* 7.6*  --  7.5* 7.6*  --   PHOS  --   --   --   --  4.7* 4.6 2.7   Liver Function Tests: Recent Labs  Lab 11/24/18 1120 11/24/18 1251  ALBUMIN 2.1* 2.2*   No results for input(s): LIPASE, AMYLASE in the last 168 hours. No results for input(s): AMMONIA in the last 168 hours. CBC: Recent Labs  Lab 11/20/18 0322  11/22/18 0309  11/22/18 1147 11/22/18 1822 11/24/18 1120 11/24/18 1250  WBC 4.7  --  6.2  --  4.8 6.2 5.8  HGB 7.3*   < > 8.8* 8.8* 8.9* 8.2* 8.0*  HCT 23.3*   < > 25.7* 26.0* 26.0* 24.1* 23.3*  MCV 88.6  --  86.2  --  87.2 87.3 88.6  PLT 188  --  180  --  178 182 182   < > = values in this interval not displayed.   Cardiac Enzymes: No results for input(s): CKTOTAL, CKMB, CKMBINDEX, TROPONINI in the last 168 hours. BNP: Invalid input(s): POCBNP CBG: Recent Labs  Lab 11/24/18 0737 11/24/18 1148 11/24/18 1728 11/24/18 2101 11/25/18 0757  GLUCAP 137* 199* 115* 291* 220*   D-Dimer No results for input(s): DDIMER in the last 72 hours. Hgb A1c No results for input(s): HGBA1C in the last 72 hours. Lipid Profile No results for input(s): CHOL, HDL, LDLCALC, TRIG, CHOLHDL, LDLDIRECT in the last 72 hours. Thyroid function studies No results for input(s): TSH, T4TOTAL, T3FREE, THYROIDAB in the last 72 hours.  Invalid input(s): FREET3 Anemia work up No results for input(s): VITAMINB12, FOLATE, FERRITIN, TIBC, IRON, RETICCTPCT in the last 72 hours. Urinalysis    Component Value Date/Time   COLORURINE STRAW (A) 11/16/2018 0147   APPEARANCEUR CLEAR 11/16/2018 0147   LABSPEC 1.010 11/16/2018 0147   PHURINE 6.0 11/16/2018 0147   GLUCOSEU 150 (A) 11/16/2018 0147   HGBUR SMALL (A) 11/16/2018 0147   BILIRUBINUR NEGATIVE 11/16/2018 0147   BILIRUBINUR negative 08/29/2015 1652   KETONESUR NEGATIVE 11/16/2018 0147   PROTEINUR >=300 (A) 11/16/2018 0147   UROBILINOGEN 0.2 08/29/2015 1652   NITRITE NEGATIVE 11/16/2018 0147   LEUKOCYTESUR NEGATIVE 11/16/2018 0147   Sepsis Labs Invalid input(s): PROCALCITONIN,  WBC,  [place] [facility] Recent Results (from the past 240 hour(s))  SARS Coronavirus 2 [facility] order, Performed in [facility] hospital lab) Nasopharyngeal Nasopharyngeal Swab     Status: None   Collection Time: 11/15/18 11:15 PM   Specimen: Nasopharyngeal Swab  Result Value Ref  Range Status   SARS Coronavirus 2 NEGATIVE NEGATIVE Final    Comment: (NOTE) If result is NEGATIVE SARS-CoV-2 target nucleic acids are NOT DETECTED. The SARS-CoV-2 RNA is generally detectable in upper and lower  respiratory specimens during the acute phase of infection. The lowest  concentration of SARS-CoV-2 viral copies this assay can detect is 250  copies / mL. A negative result does not preclude SARS-CoV-2 infection  and should not be used as the sole basis for treatment or other  patient management decisions.  A negative result may occur with  improper specimen collection /  handling, submission of specimen other  than nasopharyngeal swab, presence of viral mutation(s) within the  areas targeted by this assay, and inadequate number of viral copies  (<250 copies / mL). A negative result must be combined with clinical  observations, patient history, and epidemiological information. If result is POSITIVE SARS-CoV-2 target nucleic acids are DETECTED. The SARS-CoV-2 RNA is generally detectable in upper and lower  respiratory specimens dur ing the acute phase of infection.  Positive  results are indicative of active infection with SARS-CoV-2.  Clinical  correlation with patient history and other diagnostic information is  necessary to determine patient infection status.  Positive results do  not rule out bacterial infection or co-infection with other viruses. If result is PRESUMPTIVE POSTIVE SARS-CoV-2 nucleic acids MAY BE PRESENT.   A presumptive positive result was obtained on the submitted specimen  and confirmed on repeat testing.  While 2019 novel coronavirus  (SARS-CoV-2) nucleic acids may be present in the submitted sample  additional confirmatory testing may be necessary for epidemiological  and / or clinical management purposes  to differentiate between  SARS-CoV-2 and other Sarbecovirus currently known to infect humans.  If clinically indicated additional testing with an  alternate test  methodology [phone]) is advised. The SARS-CoV-2 RNA is generally  detectable in upper and lower respiratory sp ecimens during the acute  phase of infection. The expected result is Negative. Fact Sheet for Patients:  StrictlyIdeas.no Fact Sheet for Healthcare Providers: BankingDealers.co.za This test is not yet approved or cleared by the [place] FDA and has been authorized for detection and/or diagnosis of SARS-CoV-2 by FDA under an Emergency Use Authorization (EUA).  This EUA will remain in effect (meaning this test can be used) for the duration of the COVID-19 declaration under Section 564(b)(1) of the Act, 21 U.S.C. section 360bbb-3(b)(1), unless the authorization is terminated or revoked sooner. Performed at [facility], [place] [address] [place], [place] [address]      Time coordinating discharge: 35 minutes  SIGNED:   Aline August, MD  [facility] 11/25/2018, 9:41 AM

## 2018-11-25 NOTE — Progress Notes (Signed)
Patient ID: Max Sanchez, male   DOB: [birth year], 53 y.o.   MRN: [id number]  PROGRESS NOTE    Cas Mergel  [id number] DOB: Oct 14, 1965 DOA: 11/15/2018 PCP: Scot Jun, FNP   Brief Narrative:  53 year old male with history of hypertension, hyperlipidemia, diabetes mellitus type 2, unspecified CVA, chronic kidney disease stage IV, anemia, chronic diastolic CHF presented with worsening shortness of breath.  He was found to have acute on chronic CHF due to fluid overload caused by renal failure.  Nephrology was consulted.  He had to be subsequently started on dialysis.  Currently awaiting outpatient dialysis unit arrangement.  Assessment & Plan:   Acute kidney injury on chronic kidney disease stage V now progressed to ESRD -Nephrology following.  Underwent right IJ dialysis catheter placement, dialysis was started on 11/16/2018 evening -Status post left radiocephalic AV fistula creation by Dr. Donnetta Hutching on 11/22/2018 along with exchange of right IJ tunneled HD catheter for temporary catheter -Dialysis as per nephrology schedule.  Awaiting arrangement for outpatient dialysis unit prior to discharge. -Patient is currently medically stable for discharge.  Acute hypoxic respiratory failure due to fluid overload Acute on chronic diastolic CHF - Echo during this hospitalization showed EF of 60 to 65% -Respiratory failure has resolved.  Currently on room air.  Volume managed by dialysis.  Strict input and output.  Daily weights.  Fluid restriction.  Hypertension -Blood pressure stable.  Continue Norvasc, hydralazine and beta-blocker  Dyslipidemia -Continue statin  History of unspecified stroke -Continue aspirin and statin for secondary prevention  Troponin elevation -In non-ACS pattern due to stress from respiratory failure and CHF and renal failure. -Troponins did not elevate significantly.  Patient was chest pain-free and EKG was unremarkable.  Echo noted with preserved EF and  normal wall motion abnormality.  Continue aspirin, beta-blocker and statin  Anemia of chronic disease -Received 1 unit packed red cells on 11/20/2018.  Hemoglobin stable  Diabetes mellitus type 2 -Continue sliding scale insulin.   DVT prophylaxis: Heparin Code Status: Full Family Communication: None at bedside Disposition Plan: Home once outpatient dialysis arrangement has been made  Consultants: Nephrology/vascular surgery  Procedures:  Right IJ dialysis catheter placement on 11/16/2018   left radiocephalic AV fistula creation by Dr. early on 11/22/2018 along with exchange of right IJ tunneled HD catheter for temporary catheter  TTE -  1. The left ventricle has normal systolic function with an ejection fraction of 60-65%. The cavity size was mildly dilated. Left ventricular diastolic Doppler parameters are consistent with pseudonormalization. Elevated left ventricular end-diastolic pressure. 2. The right ventricle has normal systolic function. The cavity was normal. There is No increase in right ventricular wall thickness. 3. Left atrial size was severely dilated. 4. Right atrial size was normal. 5. Moderate pericardial effusion. 6. No evidence of mitral valve stenosis. 7. The aortic valve is tricuspid. Aortic valve regurgitation is mild by color flow Doppler. No stenosis of the aortic valve. 8. Pulmonic valve regurgitation is mild by color flow Doppler. 9. The aorta is normal unless otherwise noted. 10. The inferior vena cava was is dilated in size with >50% respiratory variability, suggesting right atrial pressure of 8 mmHg.  antimicrobials:  None  Subjective: Patient seen and examined at bedside.  Denies any overnight fever, nausea, vomiting, worsening shortness of breath. Objective: Vitals:   11/24/18 1556 11/24/18 2059 11/25/18 0500 11/25/18 0504  BP: (!) 162/86 135/63  (!) 153/81  Pulse: 70 79  69  Resp: 18     Temp: 97.9 F (36.6  C) 98.8 F (37.1 C)  98.9  F (37.2 C)  TempSrc: Oral     SpO2: 97% 99%  99%  Weight: 78.6 kg  78 kg   Height:        Intake/Output Summary (Last 24 hours) at 11/25/2018 0743 Last data filed at 11/25/2018 0600 Gross per 24 hour  Intake 300 ml  Output 2519 ml  Net -2219 ml   Filed Weights   11/24/18 1220 11/24/18 1556 11/25/18 0500  Weight: 81.6 kg 78.6 kg 78 kg    Examination:  General exam: No acute distress.   Respiratory system: Bilateral decreased breath sounds at bases with scattered crackles  cardiovascular system: Rate controlled, scattered Gastrointestinal system: Abdomen is nondistended, soft and nontender. Normal bowel sounds heard. Extremities: No cyanosis; trace edema   Data Reviewed: I have personally reviewed following labs and imaging studies  CBC: Recent Labs  Lab 11/20/18 0322  11/22/18 0309 11/22/18 1147 11/22/18 1822 11/24/18 1120 11/24/18 1250  WBC 4.7  --  6.2  --  4.8 6.2 5.8  HGB 7.3*   < > 8.8* 8.8* 8.9* 8.2* 8.0*  HCT 23.3*   < > 25.7* 26.0* 26.0* 24.1* 23.3*  MCV 88.6  --  86.2  --  87.2 87.3 88.6  PLT 188  --  180  --  178 182 182   < > = values in this interval not displayed.   Basic Metabolic Panel: Recent Labs  Lab 11/20/18 0322 11/21/18 0325 11/22/18 0309 11/22/18 1147 11/24/18 1120 11/24/18 1251 11/25/18 0240  NA 131* 130* 127* 134* 129* 130*  --   K 4.1 4.5 4.6 3.2* 4.0 3.9  --   CL 95* 94* 93*  --  94* 95*  --   CO2 23 23 22   --  24 26  --   GLUCOSE 127* 118* 120* 113* 220* 197*  --   BUN 30* 41* 53*  --  46* 46*  --   CREATININE 5.53* 6.79* 7.61*  --  6.63* 6.70*  --   CALCIUM 7.5* 7.8* 7.6*  --  7.5* 7.6*  --   PHOS  --   --   --   --  4.7* 4.6 2.7   GFR: Estimated Creatinine Clearance: 12.7 mL/min (A) (by C-G formula based on SCr of 6.7 mg/dL (H)). Liver Function Tests: Recent Labs  Lab 11/24/18 1120 11/24/18 1251  ALBUMIN 2.1* 2.2*   No results for input(s): LIPASE, AMYLASE in the last 168 hours. No results for input(s): AMMONIA in  the last 168 hours. Coagulation Profile: No results for input(s): INR, PROTIME in the last 168 hours. Cardiac Enzymes: No results for input(s): CKTOTAL, CKMB, CKMBINDEX, TROPONINI in the last 168 hours. BNP (last 3 results) No results for input(s): PROBNP in the last 8760 hours. HbA1C: No results for input(s): HGBA1C in the last 72 hours. CBG: Recent Labs  Lab 11/23/18 2052 11/24/18 0737 11/24/18 1148 11/24/18 1728 11/24/18 2101  GLUCAP 188* 137* 199* 115* 291*   Lipid Profile: No results for input(s): CHOL, HDL, LDLCALC, TRIG, CHOLHDL, LDLDIRECT in the last 72 hours. Thyroid Function Tests: No results for input(s): TSH, T4TOTAL, FREET4, T3FREE, THYROIDAB in the last 72 hours. Anemia Panel: No results for input(s): VITAMINB12, FOLATE, FERRITIN, TIBC, IRON, RETICCTPCT in the last 72 hours. Sepsis Labs: No results for input(s): PROCALCITON, LATICACIDVEN in the last 168 hours.  Recent Results (from the past 240 hour(s))  SARS Coronavirus 2 [facility] order, Performed in [facility] hospital lab) Nasopharyngeal  Nasopharyngeal Swab     Status: None   Collection Time: 11/15/18 11:15 PM   Specimen: Nasopharyngeal Swab  Result Value Ref Range Status   SARS Coronavirus 2 NEGATIVE NEGATIVE Final    Comment: (NOTE) If result is NEGATIVE SARS-CoV-2 target nucleic acids are NOT DETECTED. The SARS-CoV-2 RNA is generally detectable in upper and lower  respiratory specimens during the acute phase of infection. The lowest  concentration of SARS-CoV-2 viral copies this assay can detect is 250  copies / mL. A negative result does not preclude SARS-CoV-2 infection  and should not be used as the sole basis for treatment or other  patient management decisions.  A negative result may occur with  improper specimen collection / handling, submission of specimen other  than nasopharyngeal swab, presence of viral mutation(s) within the  areas targeted by this assay, and inadequate number of viral  copies  (<250 copies / mL). A negative result must be combined with clinical  observations, patient history, and epidemiological information. If result is POSITIVE SARS-CoV-2 target nucleic acids are DETECTED. The SARS-CoV-2 RNA is generally detectable in upper and lower  respiratory specimens dur ing the acute phase of infection.  Positive  results are indicative of active infection with SARS-CoV-2.  Clinical  correlation with patient history and other diagnostic information is  necessary to determine patient infection status.  Positive results do  not rule out bacterial infection or co-infection with other viruses. If result is PRESUMPTIVE POSTIVE SARS-CoV-2 nucleic acids MAY BE PRESENT.   A presumptive positive result was obtained on the submitted specimen  and confirmed on repeat testing.  While 2019 novel coronavirus  (SARS-CoV-2) nucleic acids may be present in the submitted sample  additional confirmatory testing may be necessary for epidemiological  and / or clinical management purposes  to differentiate between  SARS-CoV-2 and other Sarbecovirus currently known to infect humans.  If clinically indicated additional testing with an alternate test  methodology [phone]) is advised. The SARS-CoV-2 RNA is generally  detectable in upper and lower respiratory sp ecimens during the acute  phase of infection. The expected result is Negative. Fact Sheet for Patients:  StrictlyIdeas.no Fact Sheet for Healthcare Providers: BankingDealers.co.za This test is not yet approved or cleared by the [place] FDA and has been authorized for detection and/or diagnosis of SARS-CoV-2 by FDA under an Emergency Use Authorization (EUA).  This EUA will remain in effect (meaning this test can be used) for the duration of the COVID-19 declaration under Section 564(b)(1) of the Act, 21 U.S.C. section 360bbb-3(b)(1), unless the authorization is terminated  or revoked sooner. Performed at [facility], [place] [address], [place], [place] [address]          Radiology Studies: No results found.      Scheduled Meds: . amLODipine  10 mg Oral Daily  . aspirin EC  81 mg Oral Daily  . atorvastatin  40 mg Oral q1800  . Chlorhexidine Gluconate Cloth  6 each Topical Q0600  . darbepoetin (ARANESP) injection - DIALYSIS  60 mcg Intravenous Q Wed-HD  . heparin injection (subcutaneous)  5,000 Units Subcutaneous Q8H  . hydrALAZINE  50 mg Oral Q8H  . insulin aspart  0-5 Units Subcutaneous QHS  . insulin aspart  0-9 Units Subcutaneous TID WC  . labetalol  100 mg Oral Daily  . sevelamer carbonate  800 mg Oral TID WC   Continuous Infusions: . sodium chloride    . sodium chloride 10 mL/hr at 11/22/18 1208  .  ferric gluconate (FERRLECIT/NULECIT) IV Stopped (11/20/18 1343)     LOS: 9 days        Aline August, MD [facility] 11/25/2018, 7:43 AM

## 2018-11-25 NOTE — Plan of Care (Signed)
Care plan met for discharge 

## 2018-11-26 DIAGNOSIS — D689 Coagulation defect, unspecified: Secondary | ICD-10-CM | POA: Insufficient documentation

## 2018-11-26 DIAGNOSIS — T829XXA Unspecified complication of cardiac and vascular prosthetic device, implant and graft, initial encounter: Secondary | ICD-10-CM | POA: Insufficient documentation

## 2018-11-26 DIAGNOSIS — R0602 Shortness of breath: Secondary | ICD-10-CM | POA: Insufficient documentation

## 2018-11-26 DIAGNOSIS — Z111 Encounter for screening for respiratory tuberculosis: Secondary | ICD-10-CM | POA: Insufficient documentation

## 2018-11-26 DIAGNOSIS — Z992 Dependence on renal dialysis: Secondary | ICD-10-CM | POA: Insufficient documentation

## 2018-11-26 DIAGNOSIS — I5023 Acute on chronic systolic (congestive) heart failure: Secondary | ICD-10-CM | POA: Insufficient documentation

## 2018-11-26 DIAGNOSIS — D509 Iron deficiency anemia, unspecified: Secondary | ICD-10-CM | POA: Insufficient documentation

## 2018-11-26 DIAGNOSIS — N2581 Secondary hyperparathyroidism of renal origin: Secondary | ICD-10-CM | POA: Insufficient documentation

## 2018-11-26 DIAGNOSIS — Z23 Encounter for immunization: Secondary | ICD-10-CM | POA: Insufficient documentation

## 2018-12-23 DIAGNOSIS — E46 Unspecified protein-calorie malnutrition: Secondary | ICD-10-CM | POA: Insufficient documentation

## 2018-12-29 ENCOUNTER — Other Ambulatory Visit: Payer: Self-pay

## 2018-12-29 DIAGNOSIS — N186 End stage renal disease: Secondary | ICD-10-CM

## 2019-01-04 ENCOUNTER — Encounter (HOSPITAL_COMMUNITY): Payer: Self-pay

## 2019-01-05 ENCOUNTER — Other Ambulatory Visit: Payer: Self-pay

## 2019-01-05 ENCOUNTER — Ambulatory Visit (HOSPITAL_COMMUNITY)
Admission: RE | Admit: 2019-01-05 | Discharge: 2019-01-05 | Disposition: A | Discharge status: Home / Self Care | Payer: Self-pay | Source: Ambulatory Visit | Attending: Vascular Surgery | Admitting: Vascular Surgery

## 2019-01-05 ENCOUNTER — Ambulatory Visit (INDEPENDENT_AMBULATORY_CARE_PROVIDER_SITE_OTHER): Payer: Self-pay | Admitting: Physician Assistant

## 2019-01-05 VITALS — BP 226/103 | HR 73 | Temp 98.0°F | Resp 14 | Ht 66.0 in | Wt 174.9 lb

## 2019-01-05 DIAGNOSIS — N186 End stage renal disease: Secondary | ICD-10-CM

## 2019-01-05 NOTE — Progress Notes (Signed)
    Postoperative Access Visit   History of Present Illness   Max Sanchez is a 53 y.o. year old male who presents for postoperative follow-up for: left radiocephalic arteriovenous fistula (Date: 11/22/18).  The patient's wounds are healed.  The patient denies steal symptoms.  The patient is able to complete their activities of daily living.  He is dialyzing via R IJ TDC on a TTS schedule.  An interpreter is present for today's appointment.   Physical Examination   Vitals:   01/05/19 1313  BP: (!) 226/103  Pulse: 73  Resp: 14  Temp: 98 F (36.7 C)  TempSrc: Temporal  SpO2: 98%  Weight: 174 lb 14.4 oz (79.3 kg)  Height: 5\' 6"  (1.676 m)   Body mass index is 28.23 kg/m.  left arm Incision is healed, hand grip is 5/5, sensation in digits is intact, palpable thrill, bruit can be auscultated; L hand warm and well perfused    Medical Decision Making   Max Sanchez is a 53 y.o. year old male who presents s/p left radiocephalic arteriovenous fistula   Patent radiocephalic fistula without signs or symptoms of steal syndrome  We will recheck fistula duplex in 4-6 weeks; patient may require side branch ligation if fistula does not mature any further  Continue HD via R IJ [place] for now   Max Ligas PA-C [facility] Office: [phone]  Clinic MD: Dr. Oneida Alar

## 2019-01-06 ENCOUNTER — Other Ambulatory Visit: Payer: Self-pay

## 2019-01-06 DIAGNOSIS — N186 End stage renal disease: Secondary | ICD-10-CM

## 2019-01-12 MED FILL — hydrALAZINE HCL 50 MG TABS: 50 | 30 days supply | Qty: 90 | Fill #0

## 2019-01-12 MED FILL — AMLODIPINE BESYLATE 10 MG T: 10 | 30 days supply | Qty: 30 | Fill #0

## 2019-02-02 ENCOUNTER — Encounter: Payer: Self-pay | Admitting: Family

## 2019-02-02 ENCOUNTER — Other Ambulatory Visit: Payer: Self-pay | Admitting: *Deleted

## 2019-02-02 ENCOUNTER — Ambulatory Visit (HOSPITAL_COMMUNITY)
Admission: RE | Admit: 2019-02-02 | Discharge: 2019-02-02 | Disposition: A | Discharge status: Home / Self Care | Payer: Self-pay | Source: Ambulatory Visit | Attending: Family | Admitting: Family

## 2019-02-02 ENCOUNTER — Encounter: Payer: Self-pay | Admitting: *Deleted

## 2019-02-02 ENCOUNTER — Other Ambulatory Visit: Payer: Self-pay

## 2019-02-02 ENCOUNTER — Ambulatory Visit (INDEPENDENT_AMBULATORY_CARE_PROVIDER_SITE_OTHER): Payer: Self-pay | Admitting: Family

## 2019-02-02 VITALS — BP 183/92 | HR 83 | Temp 97.7°F | Resp 14 | Ht 66.0 in | Wt 166.9 lb

## 2019-02-02 DIAGNOSIS — N186 End stage renal disease: Secondary | ICD-10-CM | POA: Insufficient documentation

## 2019-02-02 DIAGNOSIS — I770 Arteriovenous fistula, acquired: Secondary | ICD-10-CM

## 2019-02-02 DIAGNOSIS — Z992 Dependence on renal dialysis: Secondary | ICD-10-CM

## 2019-02-02 NOTE — Progress Notes (Signed)
Reviewed all pre-procedure instructions with patient and interpreter. Verbalized understanding.

## 2019-02-02 NOTE — Progress Notes (Signed)
CC: Follow up AVF duplex s/p creation of AVF  History of Present Illness  Max Sanchez is a 53 y.o. (06/17/1965) male who is s/p left radiocephalic arteriovenous fistula (Date: 11/22/18).  The patient's wounds are healed. The patient denies steal symptoms.  The patient is able to complete their activities of daily living.  He is dialyzing via R IJ TDC on a TTS schedule.   An interpreter is present for today's appointment.  He is right hand dominant.  This is his first permanent HD access.   He was last evaluated on 01-05-19 by M.Eveland PA-C. At that time assessment was patent radiocephalic fistula without signs or symptoms of steal syndrome Follow uo fistula duplex advised in 4-6 weeks; patient may require side branch ligation if fistula does not mature any further Continue HD via R IJ TDC.    Past Medical History:  Diagnosis Date  . CKD (chronic kidney disease), stage III 03/10/2018  . Diabetic neuropathy ([place])   . History of CVA (cerebrovascular accident) 03/10/2018  . Hyperlipidemia 03/19/2018  . Hypertension 03/10/2018  . Hypertensive retinopathy of left eye   . 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 Type 2 diabetes mellitus with complication, without long-term current use of insulin ([facility])     Social History Social History   Tobacco Use  . Smoking status: Former Research scientist (life sciences)  . Smokeless tobacco: Never Used  . Tobacco comment: 10 pack year history; quit 2 years ago  Substance Use Topics  . Alcohol use: Not Currently    Comment: stopped 2 years ago  . Drug use: Not Currently    Comment: tried cocaine and marijuana when younger    Family History Family History  Problem Relation Age of Onset  . Diabetes Father   . Diabetes Brother   . Kidney disease Brother     Surgical History Past Surgical History:  Procedure Laterality Date  . AV FISTULA PLACEMENT Left 11/22/2018   Procedure: ARTERIOVENOUS (AV) FISTULA  CREATION LEFT ARM;  Surgeon: Rosetta Posner, MD;  Location: [facility];  Service: Vascular;  Laterality: Left;  . EYE SURGERY Bilateral   . FOOT SURGERY    . INSERTION OF DIALYSIS CATHETER Right 11/22/2018   Procedure: INSERTION OF TUNNELED  DIALYSIS CATHETER, right internal jugular;  Surgeon: Rosetta Posner, MD;  Location: [facility];  Service: Vascular;  Laterality: Right;  . IR FLUORO GUIDE CV LINE RIGHT  11/16/2018  . IR US GUIDE VASC ACCESS RIGHT  11/16/2018  . PR RPR COMPLEX RETINA DETACH VITRECT &MEMBRANE PEEL Left 10/01/2016  . PR RPR COMPLEX RETINA DETACH VITRECT &MEMBRANE PEEL Right 12/03/2016  . PR RPR COMPLEX RETINA DETACH VITRECT &MEMBRANE PEEL Right 04/01/2017  . PR XCAPSL CTRC RMVL INSJ IO LENS PROSTH W/O ECP Right 12/03/2016    No Known Allergies  Current Outpatient Medications  Medication Sig Dispense Refill  . glipiZIDE (GLUCOTROL XL) 5 MG 24 hr tablet Take 1 tablet (5 mg total) by mouth daily with breakfast. 60 tablet 3   No current facility-administered medications for this visit.      REVIEW OF SYSTEMS: see HPI for pertinent positives and negatives    PHYSICAL EXAMINATION:  Vitals:   02/02/19 0937  BP: (!) 183/92  Pulse: 83  Resp: 14  Temp: 97.7 F (36.5 C)  TempSrc: Temporal  SpO2: 100%  Weight: 166 lb 14.4 oz (75.7 kg)  Height: 5\' 6"  (1.676 m)   Body mass  index is 26.94 kg/m.  General: Spanish speaking male in NAD, accompanied by interpreter  HEENT:  No gross abnormalities Pulmonary: Respirations are non-labored Abdomen: Soft and non-tender  Musculoskeletal: There are no major deformities.   Neurologic: No focal weakness or paresthesias are detected, bilateral hand grip strength is 5/5 Skin: There are no ulcer or rashes noted. Psychiatric: The patient has normal affect. Cardiovascular: There is a regular rate and rhythm without significant murmur appreciated.  Right IJ TDC in place. Left forearm AVF with brisk bruit, palpable thrill.   Non-Invasive  Vascular Imaging  Left forearm Access Duplex  (Date: 02/02/2019):  Findings: +--------------------+----------+-----------------+--------+ AVF                 PSV (cm/s)Flow Vol (mL/min)Comments +--------------------+----------+-----------------+--------+ Native artery inflow   197           297                +--------------------+----------+-----------------+--------+ AVF Anastomosis        330                              +--------------------+----------+-----------------+--------+   +------------+----------+-------------+----------+----------------+ OUTFLOW VEINPSV (cm/s)Diameter (cm)Depth (cm)    Describe     +------------+----------+-------------+----------+----------------+ AC Fossa        72        0.52        0.52                    +------------+----------+-------------+----------+----------------+ Prox Forearm   135        0.53        0.20   competing branch +------------+----------+-------------+----------+----------------+ Mid Forearm     87        0.51        0.21   competing branch +------------+----------+-------------+----------+----------------+ Dist Forearm   371        0.50        0.22   competing branch +------------+----------+-------------+----------+----------------+ Narrowing of the cephalic vein at the wrist measuring approximately 0.10 cm in diameter and 0.75 cm in length with velocities >800 cm/s. Summary: Arteriovenous fistula-Residual lumen of less than 14mm noted in the wrist segment of the cephalic outflow vein with increased velocities. Three competing branches observed.   Medical Decision Making  Max Sanchez is a 53 y.o. male who is s/p left radiocephalic arteriovenous fistula (Date: 11/22/18).  He has no steal symptoms or signs in his left hand. AVF duplex today shows 3 competing branches and a narrowing of the cephalic vein at the wrist measuring approximately 0.10 cm in diameter and 0.75 cm in length  with velocities >800 cm/s. I discussed the above with Dr. Scot Dock. Will schedule fistula gram, possible ligation of competing branches, on a non HD day.   Clemon Chambers, RN, MSN, FNP-C [facility] Office: [phone]  02/02/2019, 9:47 AM  Clinic MD

## 2019-02-04 ENCOUNTER — Other Ambulatory Visit (HOSPITAL_COMMUNITY)
Admission: RE | Admit: 2019-02-04 | Discharge: 2019-02-04 | Disposition: A | Discharge status: Home / Self Care | Payer: Self-pay | Source: Ambulatory Visit | Attending: Vascular Surgery | Admitting: Vascular Surgery

## 2019-02-04 DIAGNOSIS — Z20828 Contact with and (suspected) exposure to other viral communicable diseases: Secondary | ICD-10-CM | POA: Insufficient documentation

## 2019-02-04 DIAGNOSIS — Z01812 Encounter for preprocedural laboratory examination: Secondary | ICD-10-CM | POA: Insufficient documentation

## 2019-02-04 LAB — SARS CORONAVIRUS 2 (TAT 6-24 HRS): SARS Coronavirus 2: NEGATIVE

## 2019-02-07 ENCOUNTER — Encounter (HOSPITAL_COMMUNITY)
Admission: RE | Disposition: A | Discharge status: Home / Self Care | Payer: Self-pay | Source: Ambulatory Visit | Attending: Vascular Surgery

## 2019-02-07 ENCOUNTER — Ambulatory Visit (HOSPITAL_COMMUNITY)
Admission: RE | Admit: 2019-02-07 | Discharge: 2019-02-07 | Disposition: A | Discharge status: Home / Self Care | Payer: Self-pay | Source: Ambulatory Visit | Attending: Vascular Surgery | Admitting: Vascular Surgery

## 2019-02-07 ENCOUNTER — Encounter (HOSPITAL_COMMUNITY): Payer: Self-pay | Admitting: Vascular Surgery

## 2019-02-07 ENCOUNTER — Other Ambulatory Visit: Payer: Self-pay

## 2019-02-07 DIAGNOSIS — E1122 Type 2 diabetes mellitus with diabetic chronic kidney disease: Secondary | ICD-10-CM | POA: Insufficient documentation

## 2019-02-07 DIAGNOSIS — Z992 Dependence on renal dialysis: Secondary | ICD-10-CM

## 2019-02-07 DIAGNOSIS — N186 End stage renal disease: Secondary | ICD-10-CM | POA: Insufficient documentation

## 2019-02-07 DIAGNOSIS — Z87891 Personal history of nicotine dependence: Secondary | ICD-10-CM | POA: Insufficient documentation

## 2019-02-07 DIAGNOSIS — Z7984 Long term (current) use of oral hypoglycemic drugs: Secondary | ICD-10-CM | POA: Insufficient documentation

## 2019-02-07 DIAGNOSIS — E1140 Type 2 diabetes mellitus with diabetic neuropathy, unspecified: Secondary | ICD-10-CM | POA: Insufficient documentation

## 2019-02-07 DIAGNOSIS — E785 Hyperlipidemia, unspecified: Secondary | ICD-10-CM | POA: Insufficient documentation

## 2019-02-07 DIAGNOSIS — Y841 Kidney dialysis as the cause of abnormal reaction of the patient, or of later complication, without mention of misadventure at the time of the procedure: Secondary | ICD-10-CM | POA: Insufficient documentation

## 2019-02-07 DIAGNOSIS — I120 Hypertensive chronic kidney disease with stage 5 chronic kidney disease or end stage renal disease: Secondary | ICD-10-CM | POA: Insufficient documentation

## 2019-02-07 DIAGNOSIS — T82898A Other specified complication of vascular prosthetic devices, implants and grafts, initial encounter: Secondary | ICD-10-CM

## 2019-02-07 DIAGNOSIS — T82510A Breakdown (mechanical) of surgically created arteriovenous fistula, initial encounter: Secondary | ICD-10-CM | POA: Insufficient documentation

## 2019-02-07 HISTORY — PX: A/V FISTULAGRAM: CATH118298

## 2019-02-07 LAB — POCT I-STAT, CHEM 8
BUN: 50 mg/dL — ABNORMAL HIGH (ref 6–20)
Calcium, Ion: 1.06 mmol/L — ABNORMAL LOW (ref 1.15–1.40)
Chloride: 97 mmol/L — ABNORMAL LOW (ref 98–111)
Creatinine, Ser: 7.7 mg/dL — ABNORMAL HIGH (ref 0.61–1.24)
Glucose, Bld: 107 mg/dL — ABNORMAL HIGH (ref 70–99)
HCT: 39 % (ref 39.0–52.0)
Hemoglobin: 13.3 g/dL (ref 13.0–17.0)
Potassium: 5.4 mmol/L — ABNORMAL HIGH (ref 3.5–5.1)
Sodium: 131 mmol/L — ABNORMAL LOW (ref 135–145)
TCO2: 30 mmol/L (ref 22–32)

## 2019-02-07 SURGERY — A/V FISTULAGRAM
Anesthesia: LOCAL | Laterality: Left

## 2019-02-07 MED ORDER — HEPARIN (PORCINE) IN NACL 1000-0.9 UT/500ML-% IV SOLN
INTRAVENOUS | Status: DC | PRN
  Administered 2019-02-07: 500 mL

## 2019-02-07 MED ORDER — IODIXANOL 320 MG/ML IV SOLN
INTRAVENOUS | Status: DC | PRN
  Administered 2019-02-07: 70 mL

## 2019-02-07 MED ORDER — LIDOCAINE HCL (PF) 1 % IJ SOLN
INTRAMUSCULAR | Status: DC | PRN
  Administered 2019-02-07: 2 mL

## 2019-02-07 MED ORDER — HEPARIN (PORCINE) IN NACL 1000-0.9 UT/500ML-% IV SOLN
INTRAVENOUS | Status: AC
  Filled 2019-02-07: qty 500

## 2019-02-07 MED ORDER — LIDOCAINE HCL (PF) 1 % IJ SOLN
INTRAMUSCULAR | Status: AC
  Filled 2019-02-07: qty 30

## 2019-02-07 MED ORDER — SODIUM CHLORIDE 0.9% FLUSH: 3.0000 mL | INTRAVENOUS | Status: DC | PRN

## 2019-02-07 SURGICAL SUPPLY — 9 items
BAG SNAP BAND KOVER 36X36 (MISCELLANEOUS) ×2 IMPLANT
COVER DOME SNAP 22 D (MISCELLANEOUS) ×2 IMPLANT
KIT MICROPUNCTURE NIT STIFF (SHEATH) ×2 IMPLANT
PROTECTION STATION PRESSURIZED (MISCELLANEOUS) ×2
SHEATH PROBE COVER 6X72 (BAG) ×2 IMPLANT
STATION PROTECTION PRESSURIZED (MISCELLANEOUS) ×1 IMPLANT
STOPCOCK MORSE 400PSI 3WAY (MISCELLANEOUS) ×2 IMPLANT
TRAY PV CATH (CUSTOM PROCEDURE TRAY) ×2 IMPLANT
TUBING CIL FLEX 10 FLL-RA (TUBING) ×2 IMPLANT

## 2019-02-07 NOTE — H&P (Signed)
   History and Physical Update  The patient was interviewed and re-examined.  The patient's previous History and Physical has been reviewed and is unchanged from recent office visit. Plan for left arm fistulogram today with possible intervention.   Adelayde Minney C. Donzetta Matters, MD [facility] Office: [phone] Pager: [phone]  02/07/2019, 10:58 AM

## 2019-02-07 NOTE — Op Note (Signed)
    Patient name: Max Sanchez MRN: [id number] DOB: 11-25-1965 Sex: male  02/07/2019 Pre-operative Diagnosis: End-stage renal disease, primary nonfunction left arm AV fistula Post-operative diagnosis:  Same Surgeon:  Eda Paschal. Donzetta Matters, MD Procedure Performed: 1.  Ultrasound-guided cannulation left arm AV fistula 2.  Left arm fistulogram  Indications: 53 year old male currently on dialysis via right IJ catheter.  He has a left arm fistula that has failed to mature.  He is now indicated for fistulogram with possible intervention.  Findings: Fistula did have a strong thrill proximally.  Appears to be quite deep and somewhat small for use.  There was no area to intervene.  I could not perform retrograde imaging given the amount of drainage and the strong inflow from the radial artery.  I think patient will be best served with conversion to left upper arm AV fistula using has a basilic vein which would likely be matured to perform in 1 stage or graft if the vein is not adequate.  I discussed this with the patient and his daughter and we will get him set up as an outpatient.   Procedure:  The patient was identified in the holding area and taken to room 8.  The patient was then placed supine on the table and prepped and draped in the usual sterile fashion.  A time out was called.  Ultrasound was used to evaluate the left arm AV fistula.  This was cannulated with micropuncture needle followed by wire and sheath with direct ultrasound visualization.  And images saved the permanent record.  Micropuncture sheath was placed in left upper extremity fistulogram was performed with the above findings.  I attempted to perform retrograde imaging but could not.  The sheath was removed and the area suture-ligated.  He will be considered for conversion to left upper arm fistula versus graft.  He tolerated this procedure without immediate complication.  Contrast: 70cc   Robb Sibal C. Donzetta Matters, MD [facility] Office: [phone] Pager: [phone]

## 2019-02-07 NOTE — Discharge Instructions (Signed)

## 2019-02-08 ENCOUNTER — Other Ambulatory Visit: Payer: Self-pay | Admitting: *Deleted

## 2019-02-08 ENCOUNTER — Encounter: Payer: Self-pay | Admitting: *Deleted

## 2019-02-08 NOTE — Progress Notes (Signed)
Pre-op instruction letter faxed to [place] at [facility]. They will review with patient.

## 2019-02-09 ENCOUNTER — Telehealth: Payer: Self-pay | Admitting: *Deleted

## 2019-02-09 NOTE — Telephone Encounter (Signed)
Confirmed with Elmyra Ricks at [facility] fax received and they will review with patient.

## 2019-02-15 ENCOUNTER — Other Ambulatory Visit: Payer: Self-pay

## 2019-02-15 ENCOUNTER — Other Ambulatory Visit (HOSPITAL_COMMUNITY)
Admission: RE | Admit: 2019-02-15 | Discharge: 2019-02-15 | Disposition: A | Discharge status: Home / Self Care | Payer: Self-pay | Source: Ambulatory Visit | Attending: Vascular Surgery | Admitting: Vascular Surgery

## 2019-02-15 ENCOUNTER — Encounter (HOSPITAL_COMMUNITY): Payer: Self-pay | Admitting: Vascular Surgery

## 2019-02-15 DIAGNOSIS — Z20828 Contact with and (suspected) exposure to other viral communicable diseases: Secondary | ICD-10-CM | POA: Insufficient documentation

## 2019-02-15 DIAGNOSIS — Z01812 Encounter for preprocedural laboratory examination: Secondary | ICD-10-CM | POA: Insufficient documentation

## 2019-02-15 LAB — SARS CORONAVIRUS 2 (TAT 6-24 HRS): SARS Coronavirus 2: NEGATIVE

## 2019-02-15 NOTE — Progress Notes (Signed)
I made numerous calls to  Max Shavers., Max Sanchez daughter and his son.  Max Sanchez asked me to call her brother regarding Covid test today.  I spoke with son, Max Sanchez and asked if Max Sanchez can take Max Sanchez to be tested for Covid, prior to going to dialysis at 1100.  I started asking Max Sanchez if I could speak to his  Father, that I could call back with an interpreter on the phone. Max Sanchez reported that Max Sanchez has Max Sanchez handle all patient's medical information and medications. I asked if Max Sanchez gives me permission to speak with his daughter, patient gave permission. Max Sanchez was at work, she said I could call at 73, she could speak at that time. I confirmed history  And medication with Max Sanchez and  I gave instruction for am, including medications to take and what medications to not take.  I asked Max Sanchez what Max Sanchez CBGs have been running, Max Sanchez did not know, "he checks CBGs himself." I instructed Max Sanchez to tell Max Sanchez to not take medication for diabetes in am. I instructed patient to check CBG after awaking and every 2 hours until arrival  to the hospital.  I Instructed patient if CBG is less than 70 to drink  1/2 cup of a clear juice. Recheck CBG in 15 minutes then call pre- op desk at [phone] for further instructions.  I imformed Max Sanchez that a [place] interpreter will met Max. Genest in am.

## 2019-02-16 ENCOUNTER — Encounter (HOSPITAL_COMMUNITY): Payer: Self-pay | Admitting: Vascular Surgery

## 2019-02-16 ENCOUNTER — Telehealth: Payer: Self-pay

## 2019-02-16 ENCOUNTER — Other Ambulatory Visit: Payer: Self-pay

## 2019-02-16 ENCOUNTER — Encounter (HOSPITAL_COMMUNITY)
Admission: RE | Disposition: A | Discharge status: Home / Self Care | Payer: Self-pay | Source: Home / Self Care | Attending: Vascular Surgery

## 2019-02-16 ENCOUNTER — Ambulatory Visit (HOSPITAL_COMMUNITY): Payer: Self-pay | Admitting: Certified Registered"

## 2019-02-16 ENCOUNTER — Ambulatory Visit (HOSPITAL_COMMUNITY)
Admission: RE | Admit: 2019-02-16 | Discharge: 2019-02-16 | Disposition: A | Discharge status: Home / Self Care | Payer: Self-pay | Attending: Vascular Surgery | Admitting: Vascular Surgery

## 2019-02-16 DIAGNOSIS — E1122 Type 2 diabetes mellitus with diabetic chronic kidney disease: Secondary | ICD-10-CM | POA: Insufficient documentation

## 2019-02-16 DIAGNOSIS — Z992 Dependence on renal dialysis: Secondary | ICD-10-CM

## 2019-02-16 DIAGNOSIS — Z7984 Long term (current) use of oral hypoglycemic drugs: Secondary | ICD-10-CM | POA: Insufficient documentation

## 2019-02-16 DIAGNOSIS — N186 End stage renal disease: Secondary | ICD-10-CM | POA: Insufficient documentation

## 2019-02-16 DIAGNOSIS — Z841 Family history of disorders of kidney and ureter: Secondary | ICD-10-CM | POA: Insufficient documentation

## 2019-02-16 DIAGNOSIS — E785 Hyperlipidemia, unspecified: Secondary | ICD-10-CM | POA: Insufficient documentation

## 2019-02-16 DIAGNOSIS — Z833 Family history of diabetes mellitus: Secondary | ICD-10-CM | POA: Insufficient documentation

## 2019-02-16 DIAGNOSIS — I120 Hypertensive chronic kidney disease with stage 5 chronic kidney disease or end stage renal disease: Secondary | ICD-10-CM | POA: Insufficient documentation

## 2019-02-16 DIAGNOSIS — E113593 Type 2 diabetes mellitus with proliferative diabetic retinopathy without macular edema, bilateral: Secondary | ICD-10-CM | POA: Insufficient documentation

## 2019-02-16 DIAGNOSIS — Z87891 Personal history of nicotine dependence: Secondary | ICD-10-CM | POA: Insufficient documentation

## 2019-02-16 DIAGNOSIS — E1140 Type 2 diabetes mellitus with diabetic neuropathy, unspecified: Secondary | ICD-10-CM | POA: Insufficient documentation

## 2019-02-16 HISTORY — PX: BASCILIC VEIN TRANSPOSITION: SHX5742

## 2019-02-16 LAB — POCT I-STAT, CHEM 8
BUN: 42 mg/dL — ABNORMAL HIGH (ref 6–20)
Calcium, Ion: 1 mmol/L — ABNORMAL LOW (ref 1.15–1.40)
Chloride: 99 mmol/L (ref 98–111)
Creatinine, Ser: 5.6 mg/dL — ABNORMAL HIGH (ref 0.61–1.24)
Glucose, Bld: 92 mg/dL (ref 70–99)
HCT: 38 % — ABNORMAL LOW (ref 39.0–52.0)
Hemoglobin: 12.9 g/dL — ABNORMAL LOW (ref 13.0–17.0)
Potassium: 5.1 mmol/L (ref 3.5–5.1)
Sodium: 135 mmol/L (ref 135–145)
TCO2: 30 mmol/L (ref 22–32)

## 2019-02-16 LAB — GLUCOSE, CAPILLARY
Glucose-Capillary: 92 mg/dL (ref 70–99)
Glucose-Capillary: 110 mg/dL — ABNORMAL HIGH (ref 70–99)

## 2019-02-16 SURGERY — TRANSPOSITION, VEIN, BASILIC
Anesthesia: General | Site: Arm Upper | Laterality: Left

## 2019-02-16 MED ORDER — NEOSTIGMINE METHYLSULFATE 3 MG/3ML IV SOSY
PREFILLED_SYRINGE | INTRAVENOUS | Status: AC
  Filled 2019-02-16: qty 3

## 2019-02-16 MED ORDER — DEXAMETHASONE SODIUM PHOSPHATE 10 MG/ML IJ SOLN
INTRAMUSCULAR | Status: DC | PRN
  Administered 2019-02-16: 10 mg via INTRAVENOUS

## 2019-02-16 MED ORDER — MIDAZOLAM HCL 5 MG/5ML IJ SOLN
INTRAMUSCULAR | Status: DC | PRN
  Administered 2019-02-16: 1 mg via INTRAVENOUS

## 2019-02-16 MED ORDER — GLYCOPYRROLATE PF 0.2 MG/ML IJ SOSY
PREFILLED_SYRINGE | INTRAMUSCULAR | Status: AC
  Filled 2019-02-16: qty 1

## 2019-02-16 MED ORDER — SODIUM CHLORIDE 0.9 % IV SOLN
INTRAVENOUS | Status: DC | PRN
  Administered 2019-02-16: 500 mL

## 2019-02-16 MED ORDER — 0.9 % SODIUM CHLORIDE (POUR BTL) OPTIME
TOPICAL | Status: DC | PRN
  Administered 2019-02-16: 1000 mL

## 2019-02-16 MED ORDER — FENTANYL CITRATE (PF) 100 MCG/2ML IJ SOLN
INTRAMUSCULAR | Status: AC
  Filled 2019-02-16: qty 2

## 2019-02-16 MED ORDER — FENTANYL CITRATE (PF) 100 MCG/2ML IJ SOLN
25.0000 ug | INTRAMUSCULAR | Status: DC | PRN
  Administered 2019-02-16: 50 ug via INTRAVENOUS

## 2019-02-16 MED ORDER — LIDOCAINE 2% (20 MG/ML) 5 ML SYRINGE
INTRAMUSCULAR | Status: DC | PRN
  Administered 2019-02-16: 60 mg via INTRAVENOUS

## 2019-02-16 MED ORDER — ONDANSETRON HCL 4 MG/2ML IJ SOLN
INTRAMUSCULAR | Status: DC | PRN
  Administered 2019-02-16: 4 mg via INTRAVENOUS

## 2019-02-16 MED ORDER — ONDANSETRON HCL 4 MG/2ML IJ SOLN
INTRAMUSCULAR | Status: AC
  Filled 2019-02-16: qty 4

## 2019-02-16 MED ORDER — HYDROCODONE-ACETAMINOPHEN 5-325 MG PO TABS: 1.0000 | ORAL_TABLET | Freq: Four times a day (QID) | ORAL | 0 refills | Status: AC | PRN

## 2019-02-16 MED ORDER — PHENYLEPHRINE HCL-NACL 10-0.9 MG/250ML-% IV SOLN
INTRAVENOUS | Status: DC | PRN
  Administered 2019-02-16: 25 ug/min via INTRAVENOUS

## 2019-02-16 MED ORDER — HYDRALAZINE HCL 25 MG PO TABS: 50.0000 mg | ORAL_TABLET | Freq: Once | ORAL | Status: DC

## 2019-02-16 MED ORDER — SODIUM CHLORIDE 0.9 % IV SOLN: INTRAVENOUS | Status: DC

## 2019-02-16 MED ORDER — CHLORHEXIDINE GLUCONATE 4 % EX LIQD: 60.0000 mL | Freq: Once | CUTANEOUS | Status: DC

## 2019-02-16 MED ORDER — FENTANYL CITRATE (PF) 250 MCG/5ML IJ SOLN
INTRAMUSCULAR | Status: AC
  Filled 2019-02-16: qty 5

## 2019-02-16 MED ORDER — FENTANYL CITRATE (PF) 100 MCG/2ML IJ SOLN
INTRAMUSCULAR | Status: DC | PRN
  Administered 2019-02-16 (×2): 50 ug via INTRAVENOUS

## 2019-02-16 MED ORDER — SODIUM CHLORIDE 0.9 % IV SOLN
INTRAVENOUS | Status: AC
  Filled 2019-02-16: qty 1.2

## 2019-02-16 MED ORDER — EPHEDRINE SULFATE 50 MG/ML IJ SOLN
INTRAMUSCULAR | Status: DC | PRN
  Administered 2019-02-16: 5 mg via INTRAVENOUS
  Administered 2019-02-16 (×2): 10 mg via INTRAVENOUS

## 2019-02-16 MED ORDER — DEXAMETHASONE SODIUM PHOSPHATE 10 MG/ML IJ SOLN
INTRAMUSCULAR | Status: AC
  Filled 2019-02-16: qty 2

## 2019-02-16 MED ORDER — MIDAZOLAM HCL 2 MG/2ML IJ SOLN
INTRAMUSCULAR | Status: AC
  Filled 2019-02-16: qty 2

## 2019-02-16 MED ORDER — PROPOFOL 10 MG/ML IV BOLUS
INTRAVENOUS | Status: DC | PRN
  Administered 2019-02-16: 200 mg via INTRAVENOUS

## 2019-02-16 MED ORDER — CEFAZOLIN SODIUM-DEXTROSE 2-4 GM/100ML-% IV SOLN
2.0000 g | INTRAVENOUS | Status: AC
  Administered 2019-02-16: 11:00:00 2 g via INTRAVENOUS
  Filled 2019-02-16: qty 100

## 2019-02-16 MED ORDER — AMLODIPINE BESYLATE 5 MG PO TABS: 10.0000 mg | ORAL_TABLET | Freq: Once | ORAL | Status: DC

## 2019-02-16 SURGICAL SUPPLY — 31 items
ARMBAND PINK RESTRICT EXTREMIT (MISCELLANEOUS) ×4 IMPLANT
CANISTER SUCT 3000ML PPV (MISCELLANEOUS) ×4 IMPLANT
CLIP VESOCCLUDE MED 6/CT (CLIP) ×4 IMPLANT
CLIP VESOCCLUDE SM WIDE 6/CT (CLIP) ×4 IMPLANT
COVER PROBE W GEL 5X96 (DRAPES) ×2 IMPLANT
COVER WAND RF STERILE (DRAPES) ×4 IMPLANT
DERMABOND ADVANCED (GAUZE/BANDAGES/DRESSINGS) ×4
DERMABOND ADVANCED .7 DNX12 (GAUZE/BANDAGES/DRESSINGS) ×2 IMPLANT
ELECT REM PT RETURN 9FT ADLT (ELECTROSURGICAL) ×4
ELECTRODE REM PT RTRN 9FT ADLT (ELECTROSURGICAL) ×2 IMPLANT
GLOVE BIO SURGEON STRL SZ7.5 (GLOVE) ×4 IMPLANT
GOWN STRL REUS W/ TWL LRG LVL3 (GOWN DISPOSABLE) ×4 IMPLANT
GOWN STRL REUS W/ TWL XL LVL3 (GOWN DISPOSABLE) ×2 IMPLANT
GOWN STRL REUS W/TWL LRG LVL3 (GOWN DISPOSABLE) ×4
GOWN STRL REUS W/TWL XL LVL3 (GOWN DISPOSABLE) ×2
INSERT FOGARTY SM (MISCELLANEOUS) ×2 IMPLANT
KIT BASIN OR (CUSTOM PROCEDURE TRAY) ×4 IMPLANT
KIT TURNOVER KIT B (KITS) ×4 IMPLANT
NS IRRIG 1000ML POUR BTL (IV SOLUTION) ×4 IMPLANT
PACK CV ACCESS (CUSTOM PROCEDURE TRAY) ×4 IMPLANT
PAD ARMBOARD 7.5X6 YLW CONV (MISCELLANEOUS) ×8 IMPLANT
SUT MNCRL AB 4-0 PS2 18 (SUTURE) ×8 IMPLANT
SUT PROLENE 6 0 BV (SUTURE) ×6 IMPLANT
SUT SILK 2 0 SH (SUTURE) ×2 IMPLANT
SUT SILK 3 0 (SUTURE) ×2
SUT SILK 3-0 18XBRD TIE 12 (SUTURE) IMPLANT
SUT VIC AB 3-0 SH 27 (SUTURE) ×6
SUT VIC AB 3-0 SH 27X BRD (SUTURE) ×2 IMPLANT
TOWEL GREEN STERILE (TOWEL DISPOSABLE) ×4 IMPLANT
UNDERPAD 30X30 (UNDERPADS AND DIAPERS) ×4 IMPLANT
WATER STERILE IRR 1000ML POUR (IV SOLUTION) ×4 IMPLANT

## 2019-02-16 NOTE — Op Note (Signed)
    Patient name: Max Sanchez MRN: [id number] DOB: Jun 05, 1965 Sex: male  02/16/2019 Pre-operative Diagnosis: End-stage renal disease Post-operative diagnosis:  Same Surgeon:  Erlene Quan C. Donzetta Matters, MD Assistant: Risa Grill, PA Procedure Performed:  Left arm single stage basilic vein transposition fistula creation  Indications: 53 year old male with end-stage renal disease currently dialyzing via catheter.  He is undergone a radiocephalic AV fistula creation that has failed to mature.  He has undergone fistulogram which demonstrated runoff via the basilic vein.  He is indicated for basilic vein fistula creation versus graft.  Findings: Basilic vein was healthy had matured quite a bit above the antecubitum.  Just at the antecubitum measures approximately 5 mm.  We transpose this as a basilic vein fistula and tied off the proximal fistula.  Fistula should be good to use in 4 to 6 weeks as it had previously matured from radiocephalic AV fistula creation   Procedure:  The patient was identified in the holding area and taken to the operating where is placed supine operative table and general anesthesia was induced.  He was sterilely prepped and draped in the left upper extremity usual fashion antibiotics were minister and timeout was called.  I used ultrasound to identify the basilic vein from about the antecubitum to above the antecubitum and in the axilla.  I first made an incision below the antecubital dissected out the vein.  I made 2 separate longitudinal incisions to the axilla dissected out the vein dividing branches between clips and ties.  I marked the vein for orientation.  I dissected to the deep fascia at the antecubitum identified the brachial artery placed Vesseloops around this.  I then clamped the fistula transected.  I tied it off distally.  I then tunneled it laterally flushed with heparinized saline and clamped it.  I cleaned my artery distally proximally opened longitudinally flushed  with heparinized saline both directions.  I then sewed end to side with 6-0 Prolene suture.  Prior to completion allowed flushing all direction.  Upon completion there was a very strong thrill in the fistula itself.  There is palpable radial pulse the wrist.  Both these were confirmed with Doppler.  We then irrigated the wounds obtain hemostasis closed in layers with Vicryl and Monocryl.  Dermabond was placed at the level of the skin.  He was then awakened from anesthesia having tolerated procedure well that immediate complication.  All counts were correct at completion.  EBL: 50 cc    [name] C. Donzetta Matters, MD [facility] Office: [phone] Pager: [phone]

## 2019-02-16 NOTE — Discharge Instructions (Signed)
° °  [facility] ° °Discharge Instructions ° °AV Fistula or Graft Surgery for Dialysis Access ° °Please refer to the following instructions for your post-procedure care. Your surgeon or physician assistant will discuss any changes with you. ° °Activity ° °You may drive the day following your surgery, if you are comfortable and no longer taking prescription pain medication. Resume full activity as the soreness in your incision resolves. ° °Bathing/Showering ° °You may shower after you go home. Keep your incision dry for 48 hours. Do not soak in a bathtub, hot tub, or swim until the incision heals completely. You may not shower if you have a hemodialysis catheter. ° °Incision Care ° °Clean your incision with mild soap and water after 48 hours. Pat the area dry with a clean towel. You do not need a bandage unless otherwise instructed. Do not apply any ointments or creams to your incision. You may have skin glue on your incision. Do not peel it off. It will come off on its own in about one week. Your arm may swell a bit after surgery. To reduce swelling use pillows to elevate your arm so it is above your heart. Your doctor will tell you if you need to lightly wrap your arm with an ACE bandage. ° °Diet ° °Resume your normal diet. There are not special food restrictions following this procedure. In order to heal from your surgery, it is CRITICAL to get adequate nutrition. Your body requires vitamins, minerals, and protein. Vegetables are the best source of vitamins and minerals. Vegetables also provide the perfect balance of protein. Processed food has little nutritional value, so try to avoid this. ° °Medications ° °Resume taking all of your medications. If your incision is causing pain, you may take over-the counter pain relievers such as acetaminophen (Tylenol). If you were prescribed a stronger pain medication, please be aware these medications can cause nausea and constipation. Prevent  nausea by taking the medication with a snack or meal. Avoid constipation by drinking plenty of fluids and eating foods with high amount of fiber, such as fruits, vegetables, and grains. Do not take Tylenol if you are taking prescription pain medications. ° ° ° ° °Follow up °Your surgeon may want to see you in the office following your access surgery. If so, this will be arranged at the time of your surgery. ° °Please call us immediately for any of the following conditions: ° °Increased pain, redness, drainage (pus) from your incision site °Fever of 101 degrees or higher °Severe or worsening pain at your incision site °Hand pain or numbness. ° °Reduce your risk of vascular disease: ° °Stop smoking. If you would like help, call [facility] at 1-800-QUIT-NOW (1-800-784-8669) or [facility] at 336-586-4000 ° °Manage your cholesterol °Maintain a desired weight °Control your diabetes °Keep your blood pressure down ° °Dialysis ° °It will take several weeks to several months for your new dialysis access to be ready for use. Your surgeon will determine when it is OK to use it. Your nephrologist will continue to direct your dialysis. You can continue to use your Permcath until your new access is ready for use. ° °If you have any questions, please call the office at [phone]. ° °

## 2019-02-16 NOTE — Anesthesia Postprocedure Evaluation (Signed)
Anesthesia Post Note  Patient: Max Sanchez  Procedure(s) Performed: First and Second Stage Bascilic Vein Transposition Left Arm (Left Arm Upper)     Patient location during evaluation: PACU Anesthesia Type: General Level of consciousness: awake and alert Pain management: pain level controlled Vital Signs Assessment: post-procedure vital signs reviewed and stable Respiratory status: spontaneous breathing, nonlabored ventilation, respiratory function stable and patient connected to nasal cannula oxygen Cardiovascular status: blood pressure returned to baseline and stable Postop Assessment: no apparent nausea or vomiting Anesthetic complications: no    Last Vitals:  Vitals:   02/16/19 1256 02/16/19 1309  BP: (!) 167/85 (!) 166/84  Pulse: 78   Resp: 13 16  Temp: 36.6 C   SpO2: 100% 99%    Last Pain:  Vitals:   02/16/19 1256  TempSrc:   PainSc: 3                  Tiajuana Amass

## 2019-02-16 NOTE — Anesthesia Procedure Notes (Signed)
Procedure Name: LMA Insertion Date/Time: 02/16/2019 10:46 AM Performed by: Imagene Riches, CRNA Pre-anesthesia Checklist: Patient identified, Emergency Drugs available, Suction available and Patient being monitored Patient Re-evaluated:Patient Re-evaluated prior to induction Oxygen Delivery Method: Circle System Utilized Preoxygenation: Pre-oxygenation with 100% oxygen Induction Type: IV induction Ventilation: Mask ventilation without difficulty LMA: LMA inserted LMA Size: 4.0 Number of attempts: 1 Airway Equipment and Method: Bite block Placement Confirmation: positive ETCO2 Tube secured with: Tape Dental Injury: Teeth and Oropharynx as per pre-operative assessment

## 2019-02-16 NOTE — H&P (Signed)
History of Present Illness  Max Sanchez is a 53 y.o. (24-Mar-1965) male who is s/p left radiocephalic arteriovenous fistula (Date: 11/22/18). The patient's wounds are healed. The patient denies steal symptoms. The patient is able to complete their activities of daily living. He is dialyzing via R IJ TDC on a TTS schedule.  An interpreter is present for today's appointment.  He is right hand dominant.  This is his first permanent HD access.  He was last evaluated on 01-05-19 by M.Eveland PA-C. At that time assessment was patent radiocephalic fistula without signs or symptoms of steal syndrome  Follow uo fistula duplex advised in 4-6 weeks; patient may require side branch ligation if fistula does not mature any further  Continue HD via R IJ TDC.      Past Medical History:  Diagnosis Date  . CKD (chronic kidney disease), stage III 03/10/2018  . Diabetic neuropathy ([place])   . History of CVA (cerebrovascular accident) 03/10/2018  . Hyperlipidemia 03/19/2018  . Hypertension 03/10/2018  . Hypertensive retinopathy of left eye   . 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 Type 2 diabetes mellitus with complication, without long-term current use of insulin ([facility])    Social History  Social History        Tobacco Use  . Smoking status: Former Research scientist (life sciences)  . Smokeless tobacco: Never Used  . Tobacco comment: 10 pack year history; quit 2 years ago  Substance Use Topics  . Alcohol use: Not Currently    Comment: stopped 2 years ago  . Drug use: Not Currently    Comment: tried cocaine and marijuana when younger   Family History       Family History  Problem Relation Age of Onset  . Diabetes Father   . Diabetes Brother   . Kidney disease Brother    Surgical History       Past Surgical History:  Procedure Laterality Date  . AV FISTULA PLACEMENT Left 11/22/2018   Procedure: ARTERIOVENOUS (AV) FISTULA CREATION LEFT ARM; Surgeon: Rosetta Posner, MD; Location: [facility]; Service: Vascular; Laterality: Left;  . EYE SURGERY Bilateral   . FOOT SURGERY    . INSERTION OF DIALYSIS CATHETER Right 11/22/2018   Procedure: INSERTION OF TUNNELED DIALYSIS CATHETER, right internal jugular; Surgeon: Rosetta Posner, MD; Location: [facility]; Service: Vascular; Laterality: Right;  . IR FLUORO GUIDE CV LINE RIGHT  11/16/2018  . IR US GUIDE VASC ACCESS RIGHT  11/16/2018  . PR RPR COMPLEX RETINA DETACH VITRECT &MEMBRANE PEEL Left 10/01/2016  . PR RPR COMPLEX RETINA DETACH VITRECT &MEMBRANE PEEL Right 12/03/2016  . PR RPR COMPLEX RETINA DETACH VITRECT &MEMBRANE PEEL Right 04/01/2017  . PR XCAPSL CTRC RMVL INSJ IO LENS PROSTH W/O ECP Right 12/03/2016   No Known Allergies        Current Outpatient Medications  Medication Sig Dispense Refill  . glipiZIDE (GLUCOTROL XL) 5 MG 24 hr tablet Take 1 tablet (5 mg total) by mouth daily with breakfast. 60 tablet 3   No current facility-administered medications for this visit.    REVIEW OF SYSTEMS: see HPI for pertinent positives and negatives  PHYSICAL EXAMINATION:  Vitals:   02/16/19 0855  BP: (!) 221/92  Pulse: 82  Resp: 20  Temp: 98.2 F (36.8 C)  SpO2: 100%    Body mass index is 26.94 kg/m.  General: Spanish speaking male in NAD, accompanied by interpreter  HEENT: No gross abnormalities  Pulmonary: Respirations are non-labored  Abdomen: Soft and non-tender  Musculoskeletal: There are no major deformities.  Neurologic: No focal weakness or paresthesias are detected, bilateral hand grip strength is 5/5  Skin: There are no ulcer or rashes noted.  Psychiatric: The patient has normal affect.  Cardiovascular: There is a regular rate and rhythm without significant murmur appreciated.  Right IJ TDC in place.  Left forearm AVF with brisk bruit, palpable thrill.  Non-Invasive Vascular Imaging  Left forearm Access Duplex (Date: 02/02/2019):  Findings:   +--------------------+----------+-----------------+--------+  AVF PSV (cm/s)Flow Vol (mL/min)Comments  +--------------------+----------+-----------------+--------+  Native artery inflow 197  297    +--------------------+----------+-----------------+--------+  AVF Anastomosis  330     +--------------------+----------+-----------------+--------+  +------------+----------+-------------+----------+----------------+  OUTFLOW VEINPSV (cm/s)Diameter (cm)Depth (cm) Describe   +------------+----------+-------------+----------+----------------+  AC Fossa  72  0.52  0.52    +------------+----------+-------------+----------+----------------+  Prox Forearm 135  0.53  0.20 competing branch  +------------+----------+-------------+----------+----------------+  Mid Forearm  87  0.51  0.21 competing branch  +------------+----------+-------------+----------+----------------+  Dist Forearm 371  0.50  0.22 competing branch  +------------+----------+-------------+----------+----------------+  Narrowing of the cephalic vein at the wrist measuring approximately 0.10 cm in diameter and 0.75 cm in length with velocities >800 cm/s.    Assessment and plan 53 year old male with nonfunctional left arm fistula.  Currently on dialysis via catheter.  OR today for conversion to left upper arm AV fistula likely single stage basilic vein.  I discussed risk benefits and alternatives with patient via interpreter and he demonstrates very good understanding.  Consent was signed.  Adileny Delon C. Donzetta Matters, MD [facility] Office: [phone] Pager: [phone]

## 2019-02-16 NOTE — Anesthesia Preprocedure Evaluation (Addendum)
Anesthesia Evaluation  Patient identified by MRN, date of birth, ID band Patient awake    Reviewed: Allergy & Precautions, NPO status , Patient's Chart, lab work & pertinent test results  History of Anesthesia Complications Negative for: history of anesthetic complications  Airway Mallampati: IV  TM Distance: >3 FB Neck ROM: Full    Dental  (+) Dental Advisory Given   Pulmonary former smoker,  11/15/2018 SARS coronavirus NEG   breath sounds clear to auscultation       Cardiovascular hypertension, Pt. on medications (-) angina+CHF (on admission)   Rhythm:Regular Rate:Normal  11/17/2018 ECHO: EF 60-65%, mild AI, mod pericardial effusion   Neuro/Psych CVA, No Residual Symptoms    GI/Hepatic negative GI ROS,   Endo/Other  diabetes, Oral Hypoglycemic Agents  Renal/GU ESRFRenal disease (K+ 3.2)     Musculoskeletal   Abdominal (+) - obese,   Peds  Hematology  (+) Blood dyscrasia (Hb 8.8), anemia ,   Anesthesia Other Findings   Reproductive/Obstetrics                             Lab Results  Component Value Date   WBC 5.8 11/24/2018   HGB 12.9 (L) 02/16/2019   HCT 38.0 (L) 02/16/2019   MCV 88.6 11/24/2018   PLT 182 11/24/2018   Lab Results  Component Value Date   CREATININE 5.60 (H) 02/16/2019   BUN 42 (H) 02/16/2019   NA 135 02/16/2019   K 5.1 02/16/2019   CL 99 02/16/2019   CO2 26 11/24/2018    Anesthesia Physical  Anesthesia Plan  ASA: III  Anesthesia Plan: General   Post-op Pain Management:    Induction: Intravenous  PONV Risk Score and Plan: 1 and Treatment may vary due to age or medical condition, Propofol infusion and Ondansetron  Airway Management Planned: Oral ETT and LMA  Additional Equipment:   Intra-op Plan:   Post-operative Plan: Extubation in OR  Informed Consent: I have reviewed the patients History and Physical, chart, labs and discussed the procedure  including the risks, benefits and alternatives for the proposed anesthesia with the patient or authorized representative who has indicated his/her understanding and acceptance.     Dental advisory given  Plan Discussed with: CRNA and Surgeon  Anesthesia Plan Comments:        Anesthesia Quick Evaluation

## 2019-02-16 NOTE — Telephone Encounter (Signed)
[facility] does not dispense narcotics.  Please send the [facility] for this pt to another pharmacy.  We will delete this prescription.

## 2019-02-16 NOTE — Transfer of Care (Signed)
Immediate Anesthesia Transfer of Care Note  Patient: Max Sanchez  Procedure(s) Performed: First and Second Stage Bascilic Vein Transposition Left Arm (Left Arm Upper)  Patient Location: PACU  Anesthesia Type:General  Level of Consciousness: drowsy  Airway & Oxygen Therapy: Patient Spontanous Breathing and Patient connected to nasal cannula oxygen  Post-op Assessment: Report given to RN and Post -op Vital signs reviewed and stable  Post vital signs: Reviewed and stable  Last Vitals:  Vitals Value Taken Time  BP 151/80 02/16/19 1226  Temp 36.8 C 02/16/19 1226  Pulse 71 02/16/19 1228  Resp 0 02/16/19 1228  SpO2 100 % 02/16/19 1228  Vitals shown include unvalidated device data.  Last Pain:  Vitals:   02/16/19 1226  TempSrc:   PainSc: Asleep         Complications: No apparent anesthesia complications

## 2019-03-01 MED FILL — AMLODIPINE BESYLATE 10 MG T: 10 | 30 days supply | Qty: 30 | Fill #1

## 2019-03-01 MED FILL — hydrALAZINE HCL 50 MG TABS: 50 | 30 days supply | Qty: 90 | Fill #1

## 2019-03-02 DIAGNOSIS — R52 Pain, unspecified: Secondary | ICD-10-CM | POA: Insufficient documentation

## 2019-03-29 ENCOUNTER — Other Ambulatory Visit: Payer: Self-pay

## 2019-03-29 ENCOUNTER — Ambulatory Visit (HOSPITAL_COMMUNITY): Payer: Self-pay | Attending: Vascular Surgery

## 2019-03-29 DIAGNOSIS — N186 End stage renal disease: Secondary | ICD-10-CM

## 2019-03-29 DIAGNOSIS — Z992 Dependence on renal dialysis: Secondary | ICD-10-CM

## 2019-04-05 ENCOUNTER — Other Ambulatory Visit (HOSPITAL_COMMUNITY): Payer: Self-pay | Admitting: Nephrology

## 2019-04-05 DIAGNOSIS — N186 End stage renal disease: Secondary | ICD-10-CM

## 2019-04-06 ENCOUNTER — Encounter: Payer: Self-pay | Admitting: Surgery

## 2019-04-07 ENCOUNTER — Ambulatory Visit (HOSPITAL_COMMUNITY)
Admission: RE | Admit: 2019-04-07 | Discharge: 2019-04-07 | Disposition: A | Discharge status: Home / Self Care | Payer: Self-pay | Source: Ambulatory Visit | Attending: Nephrology | Admitting: Nephrology

## 2019-04-07 ENCOUNTER — Other Ambulatory Visit: Payer: Self-pay

## 2019-04-07 DIAGNOSIS — N186 End stage renal disease: Secondary | ICD-10-CM | POA: Insufficient documentation

## 2019-04-07 DIAGNOSIS — Z4901 Encounter for fitting and adjustment of extracorporeal dialysis catheter: Secondary | ICD-10-CM | POA: Insufficient documentation

## 2019-04-07 HISTORY — PX: IR REMOVAL TUN CV CATH W/O FL: IMG2289

## 2019-04-07 MED ORDER — CHLORHEXIDINE GLUCONATE 4 % EX LIQD
CUTANEOUS | Status: DC | PRN
  Administered 2019-04-07: 1 via TOPICAL

## 2019-04-07 MED ORDER — LIDOCAINE HCL (PF) 1 % IJ SOLN
INTRAMUSCULAR | Status: DC | PRN
  Administered 2019-04-07: 10 mL

## 2019-04-07 MED ORDER — LIDOCAINE HCL 1 % IJ SOLN
INTRAMUSCULAR | Status: AC
  Filled 2019-04-07: qty 20

## 2019-04-07 MED ORDER — CHLORHEXIDINE GLUCONATE 4 % EX LIQD
CUTANEOUS | Status: AC
  Filled 2019-04-07: qty 15

## 2019-04-07 NOTE — Procedures (Signed)
Right IJ tunneled HD cath removed in its entirety without immediate complications. Medication used -1% lidocaine to skin/SQ tissue. Vaseline gauze dressing applied to site. Site care instructions reviewed with pt. EBL< 5 cc.

## 2019-04-08 MED FILL — AMLODIPINE BESYLATE 10 MG T: 10 | 30 days supply | Qty: 30 | Fill #2

## 2019-04-08 MED FILL — hydrALAZINE HCL 50 MG TABS: 50 | 30 days supply | Qty: 90 | Fill #2

## 2019-05-09 DIAGNOSIS — E559 Vitamin D deficiency, unspecified: Secondary | ICD-10-CM | POA: Insufficient documentation

## 2019-05-09 DIAGNOSIS — L299 Pruritus, unspecified: Secondary | ICD-10-CM | POA: Insufficient documentation

## 2019-05-09 DIAGNOSIS — Z87892 Personal history of anaphylaxis: Secondary | ICD-10-CM | POA: Insufficient documentation

## 2019-05-10 DIAGNOSIS — E441 Mild protein-calorie malnutrition: Secondary | ICD-10-CM | POA: Insufficient documentation

## 2019-05-17 MED FILL — AMLODIPINE BESYLATE 10 MG T: 10 | 30 days supply | Qty: 30 | Fill #3

## 2019-05-17 MED FILL — hydrALAZINE HCL 50 MG TABS: 50 | 30 days supply | Qty: 90 | Fill #3

## 2019-06-09 IMAGING — US US RENAL
1 series · 14 of 25 positions shown · non-contrast
Comparison: None.

CLINICAL DATA: 52-year-old male with history of acute kidney
injury.

EXAM:
RENAL / URINARY TRACT ULTRASOUND COMPLETE

[Series 1: us renal · 0.25mm/px · 14 of 38 slices shown]
[im 1/38]
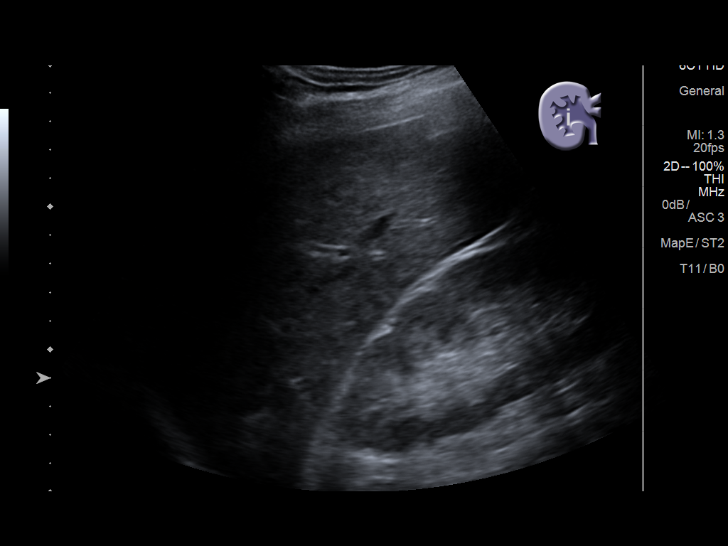
[im 4/38]
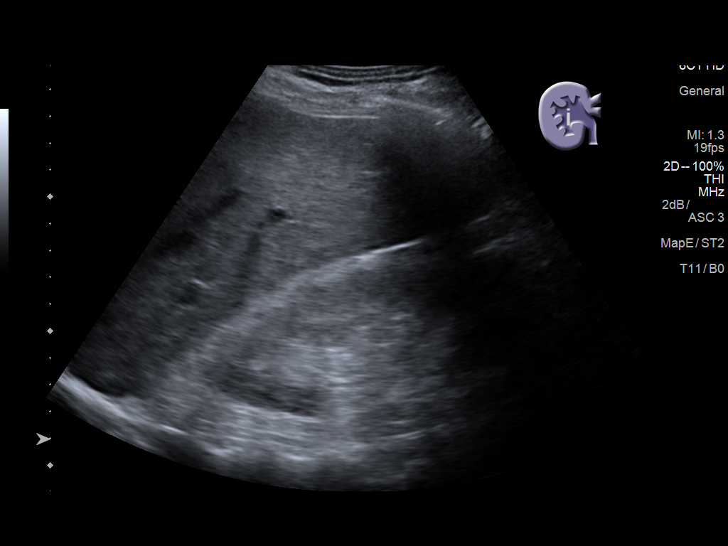
[im 7/38]
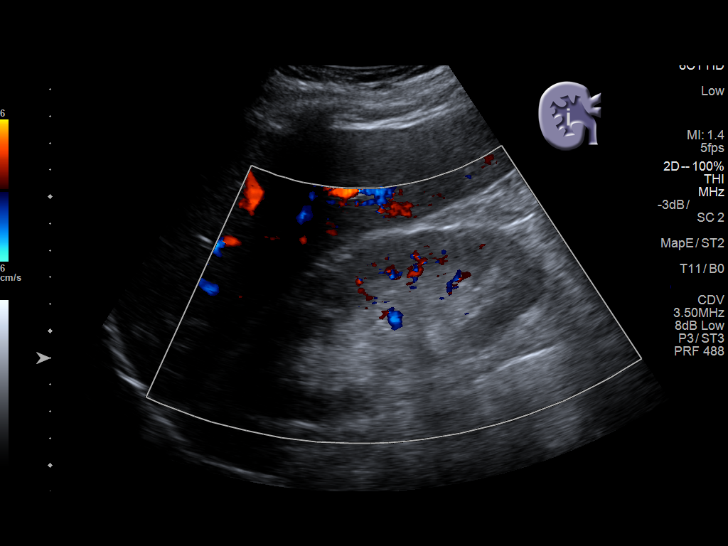
[im 10/38]
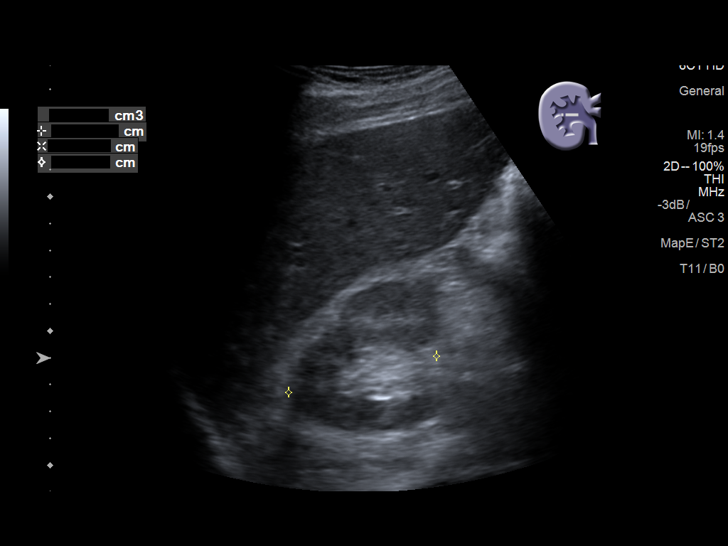
[im 13/38]
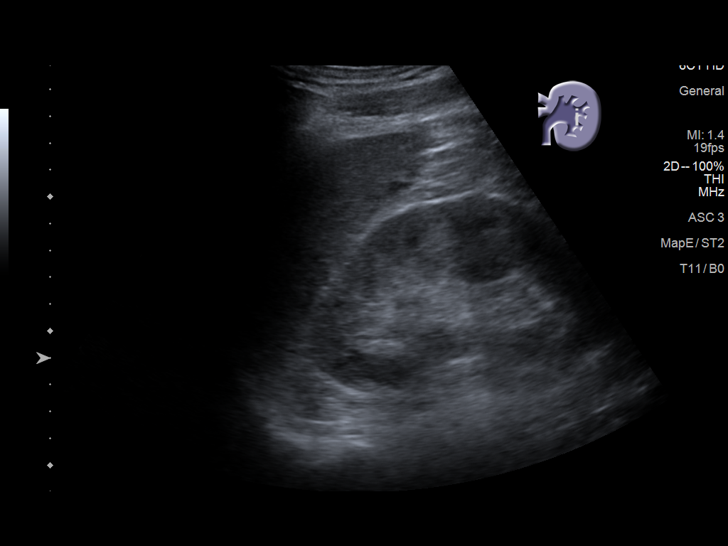
[im 14/38]
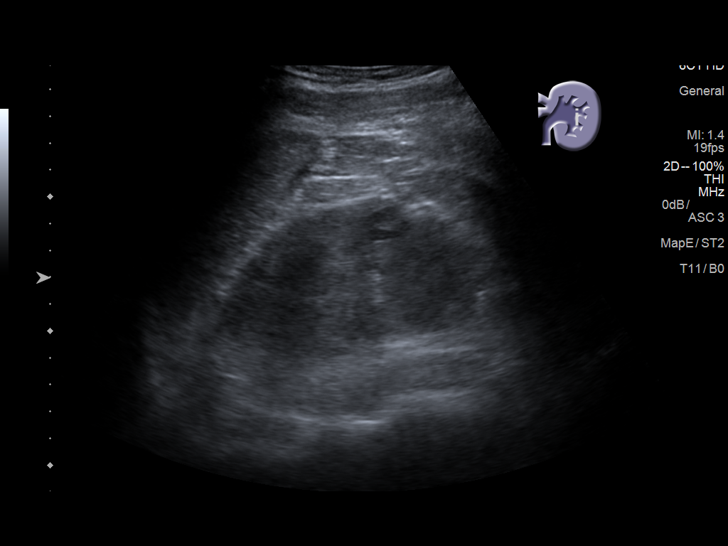
[im 17/38]
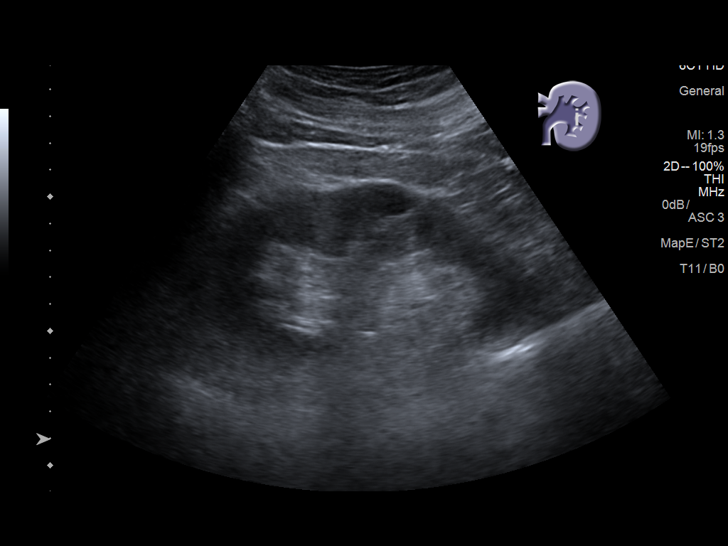
[im 21/38]
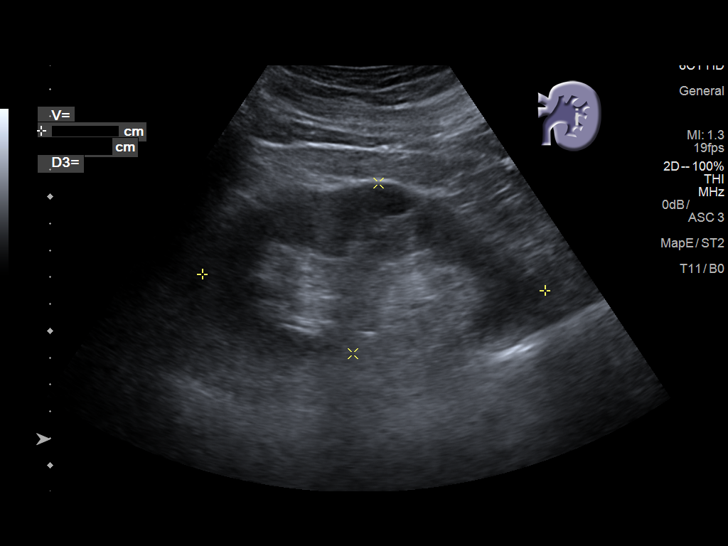
[im 24/38]
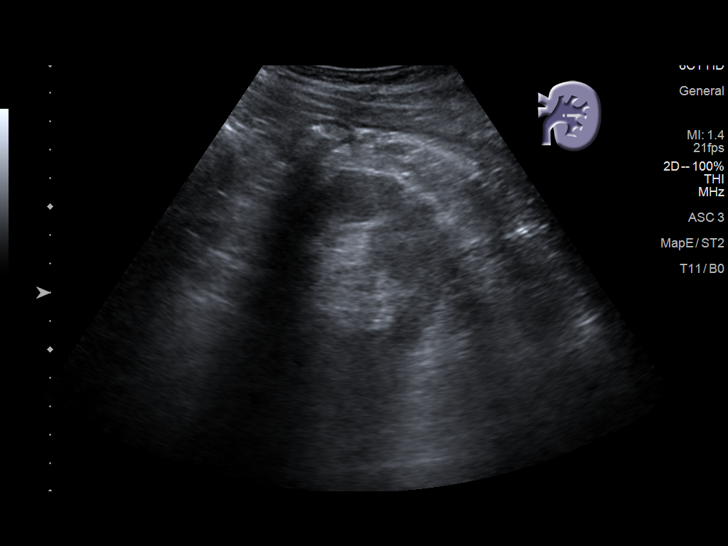
[im 25/38]
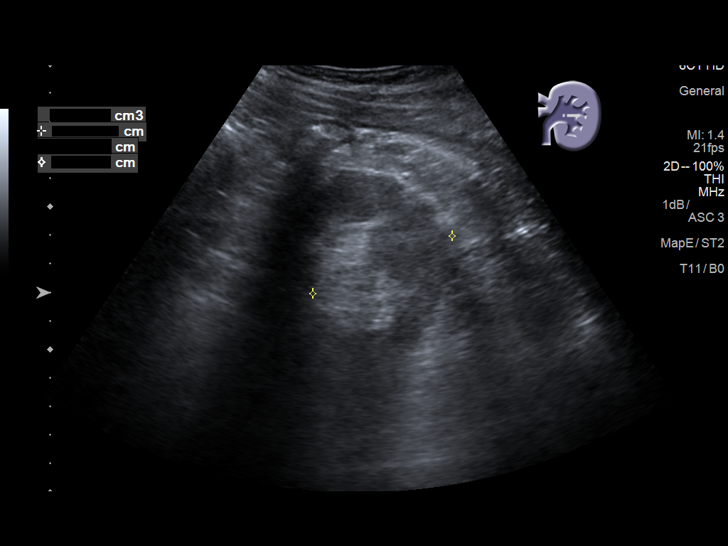
[im 28/38]
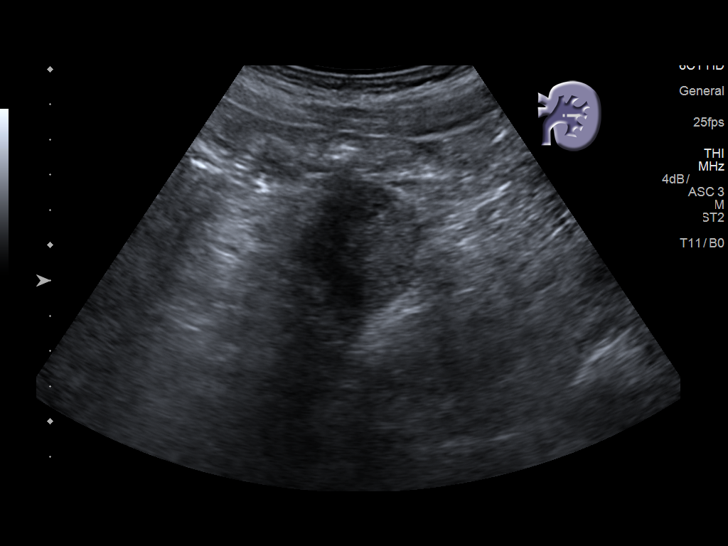
[im 31/38]
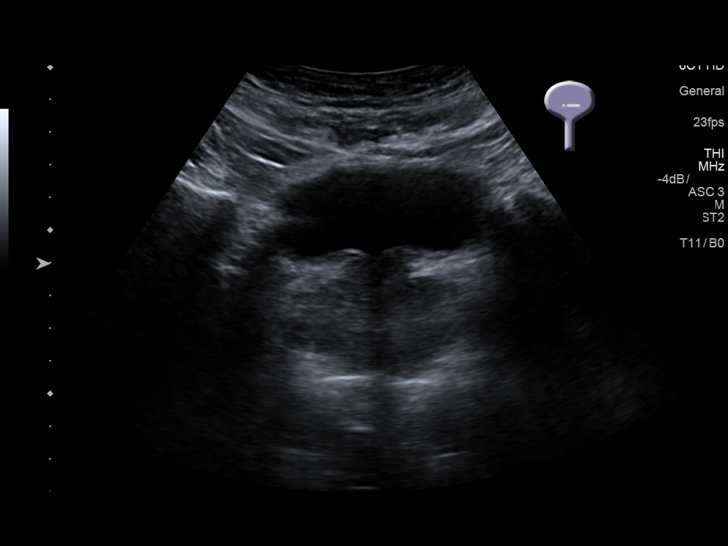
[im 34/38]
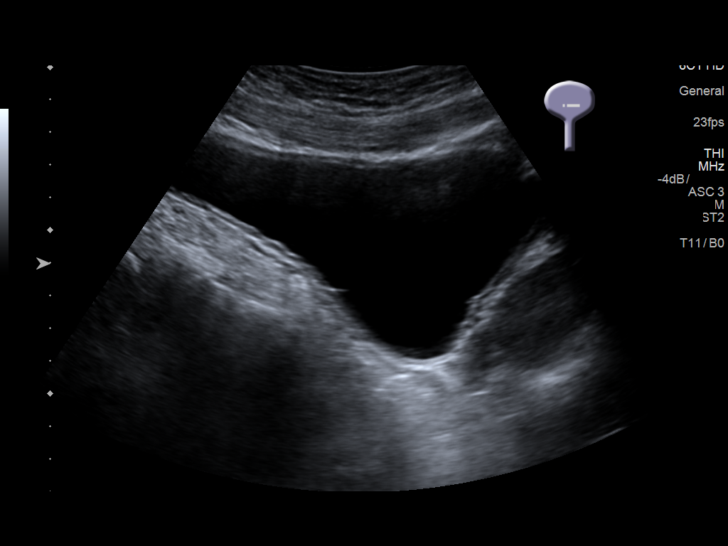
[im 38/38]
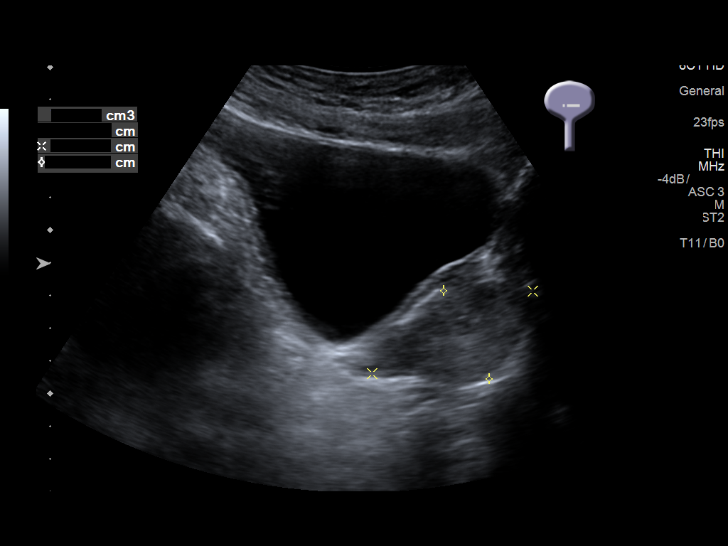

[14 of 25 positions shown; findings below may reference images not displayed]

FINDINGS: Right Kidney:

Renal measurements: 12.7 x 5.2 x 5.7 cm = volume: 194.1 mL .
Echogenicity within normal limits. No mass or hydronephrosis
visualized.

Left Kidney:

Renal measurements: 12.8 x 6.4 x 5.3 cm = volume: 227.4 mL.
Echogenicity within normal limits. In the interpolar region of the
left kidney there is a 1.9 x 1.3 x 1.7 cm anechoic lesion with
increased through transmission, compatible with a simple cyst. No
hydronephrosis visualized.

Bladder:

Appears normal for degree of bladder distention.
IMPRESSION: 1. Other than a simple cyst in the left kidney, the sonographic
appearance of the kidneys is normal. Specifically, no
hydronephrosis.

## 2019-07-06 MED FILL — hydrALAZINE HCL 50 MG TABS: 50 | 30 days supply | Qty: 90 | Fill #4

## 2019-07-21 ENCOUNTER — Other Ambulatory Visit: Payer: Self-pay | Admitting: Family Medicine

## 2019-08-03 MED FILL — glipiZIDE 5 MG TABS: 5 | 30 days supply | Qty: 30 | Fill #0

## 2019-08-29 MED FILL — glipiZIDE 5 MG TABS: 5 | 30 days supply | Qty: 30 | Fill #0

## 2019-09-09 MED FILL — hydrALAZINE HCL 50 MG TABS: 50 | 30 days supply | Qty: 90 | Fill #5

## 2019-09-21 ENCOUNTER — Encounter: Payer: Self-pay | Admitting: Surgery

## 2019-09-26 MED FILL — glipiZIDE 5 MG TABS: 5 | 30 days supply | Qty: 30 | Fill #1

## 2019-09-26 MED FILL — AMLODIPINE BESYLATE 10 MG T: 10 | 30 days supply | Qty: 30 | Fill #5

## 2019-09-26 MED FILL — hydrALAZINE HCL 50 MG TABS: 50 | 30 days supply | Qty: 90 | Fill #5

## 2020-01-19 DIAGNOSIS — R197 Diarrhea, unspecified: Secondary | ICD-10-CM | POA: Insufficient documentation

## 2020-01-19 MED FILL — glipiZIDE 5 MG TABS: 5 | 30 days supply | Qty: 30 | Fill #2

## 2020-04-11 ENCOUNTER — Other Ambulatory Visit: Payer: Self-pay | Admitting: Pharmacy Technician

## 2020-04-11 MED FILL — glipiZIDE 5 MG TABS: 5 | 30 days supply | Qty: 30 | Fill #2

## 2020-05-03 ENCOUNTER — Other Ambulatory Visit: Payer: Self-pay | Admitting: Nephrology

## 2020-05-03 MED FILL — ATORVASTATIN CALCIUM 40 MG: 40 | 30 days supply | Qty: 30 | Fill #0

## 2020-05-03 MED FILL — glipiZIDE 5 MG TABS: 5 | 30 days supply | Qty: 30 | Fill #2

## 2020-05-03 MED FILL — hydrALAZINE HCL 50 MG TABS: 50 | 30 days supply | Qty: 90 | Fill #0

## 2020-05-03 MED FILL — AMLODIPINE BESYLATE 10 MG T: 10 | 30 days supply | Qty: 30 | Fill #0

## 2020-07-12 DIAGNOSIS — T782XXA Anaphylactic shock, unspecified, initial encounter: Secondary | ICD-10-CM | POA: Insufficient documentation

## 2020-07-12 DIAGNOSIS — T7840XA Allergy, unspecified, initial encounter: Secondary | ICD-10-CM | POA: Insufficient documentation

## 2020-07-12 DIAGNOSIS — R0600 Dyspnea, unspecified: Secondary | ICD-10-CM | POA: Insufficient documentation

## 2020-08-21 ENCOUNTER — Other Ambulatory Visit: Payer: Self-pay

## 2020-08-21 MED FILL — Glipizide Tab 5 MG: ORAL | 30 days supply | Qty: 30 | Fill #0 | Status: AC

## 2020-10-30 ENCOUNTER — Other Ambulatory Visit: Payer: Self-pay

## 2020-10-30 MED FILL — Glipizide Tab 5 MG: ORAL | 30 days supply | Qty: 30 | Fill #1 | Status: AC

## 2020-10-31 ENCOUNTER — Other Ambulatory Visit: Payer: Self-pay

## 2020-12-04 IMAGING — DX DG CHEST 2V
2 series · 2 of 2 positions shown · non-contrast
Comparison: None.

CLINICAL DATA: Altered mental status with cough and vomiting

EXAM:
CHEST - 2 VIEW

[chest lat]
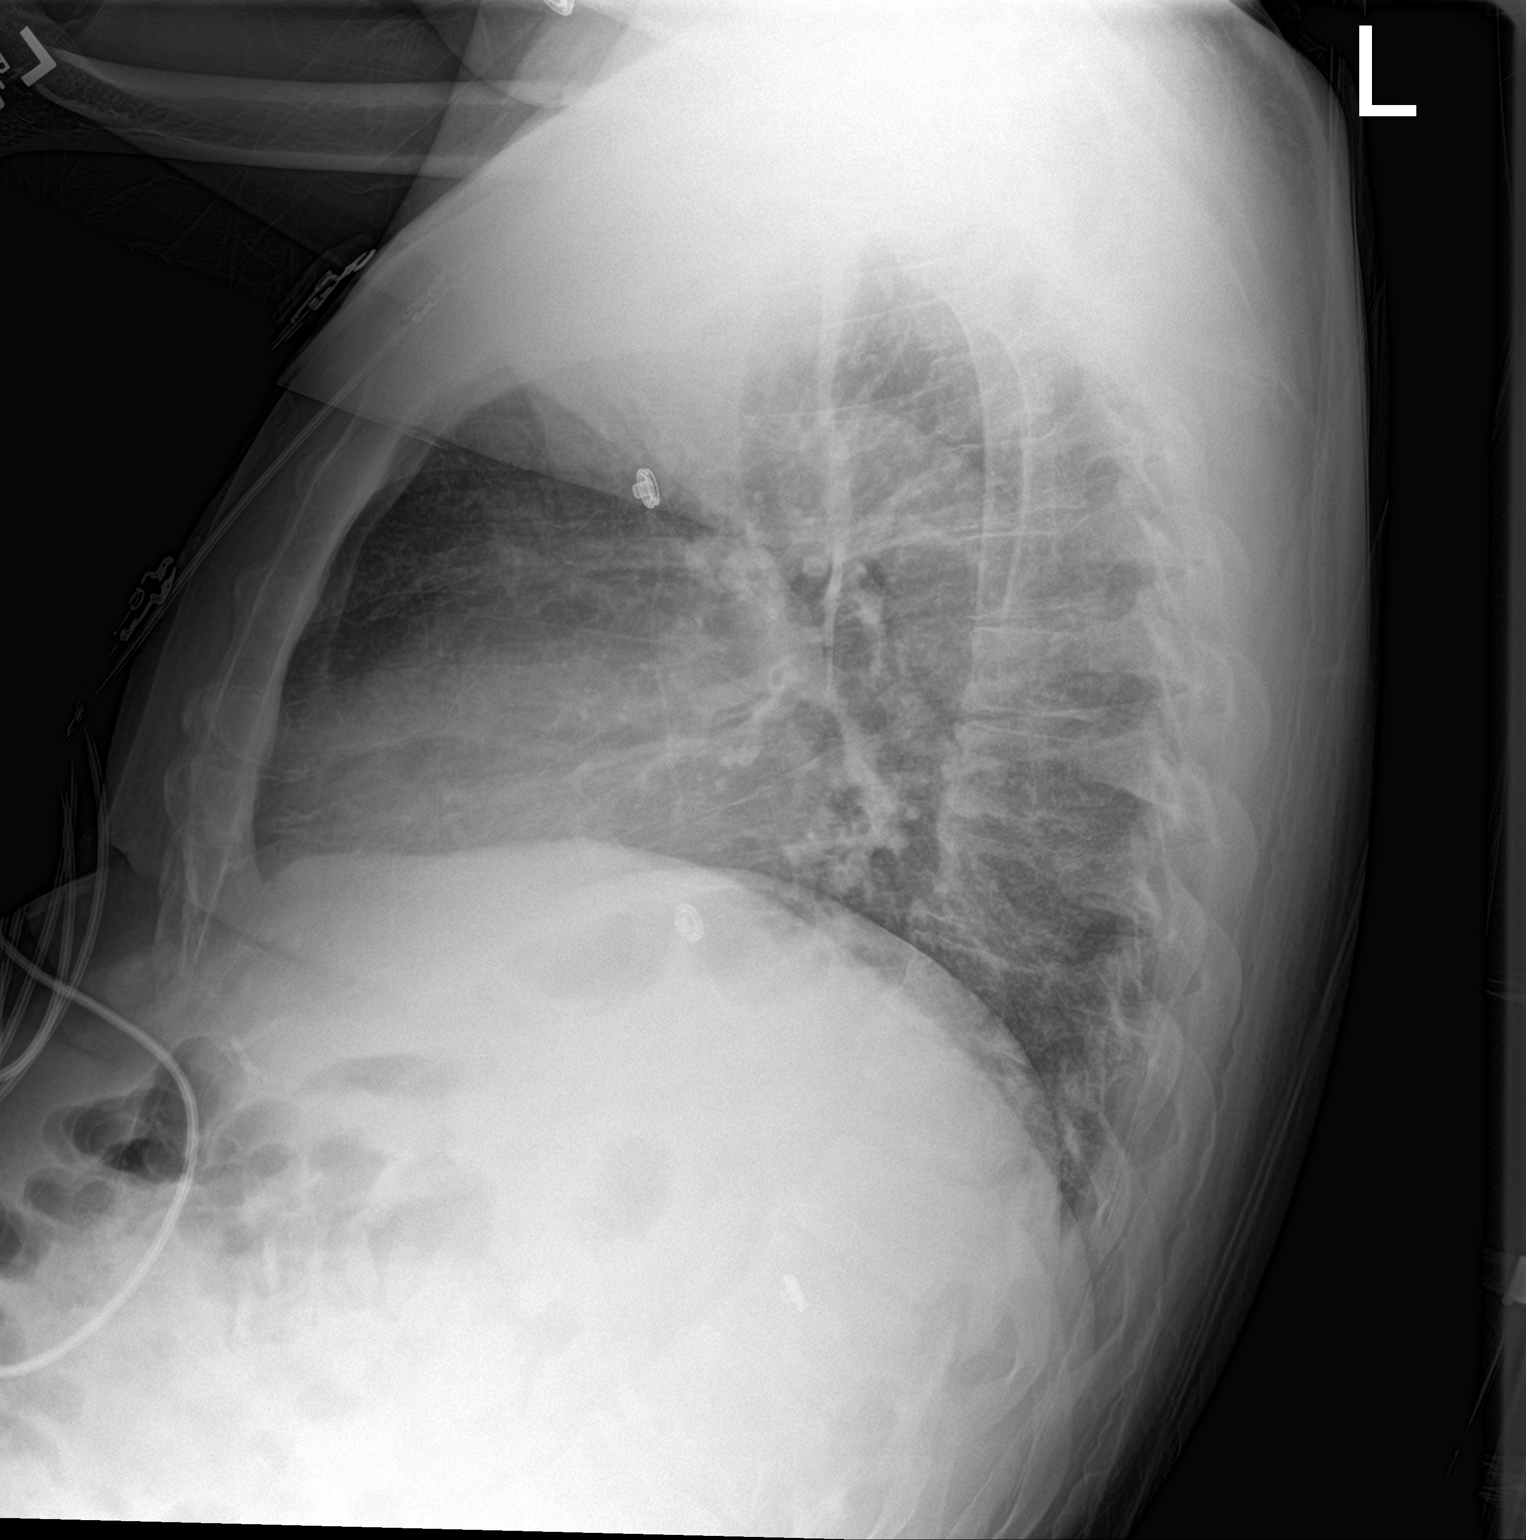

[chest ap]
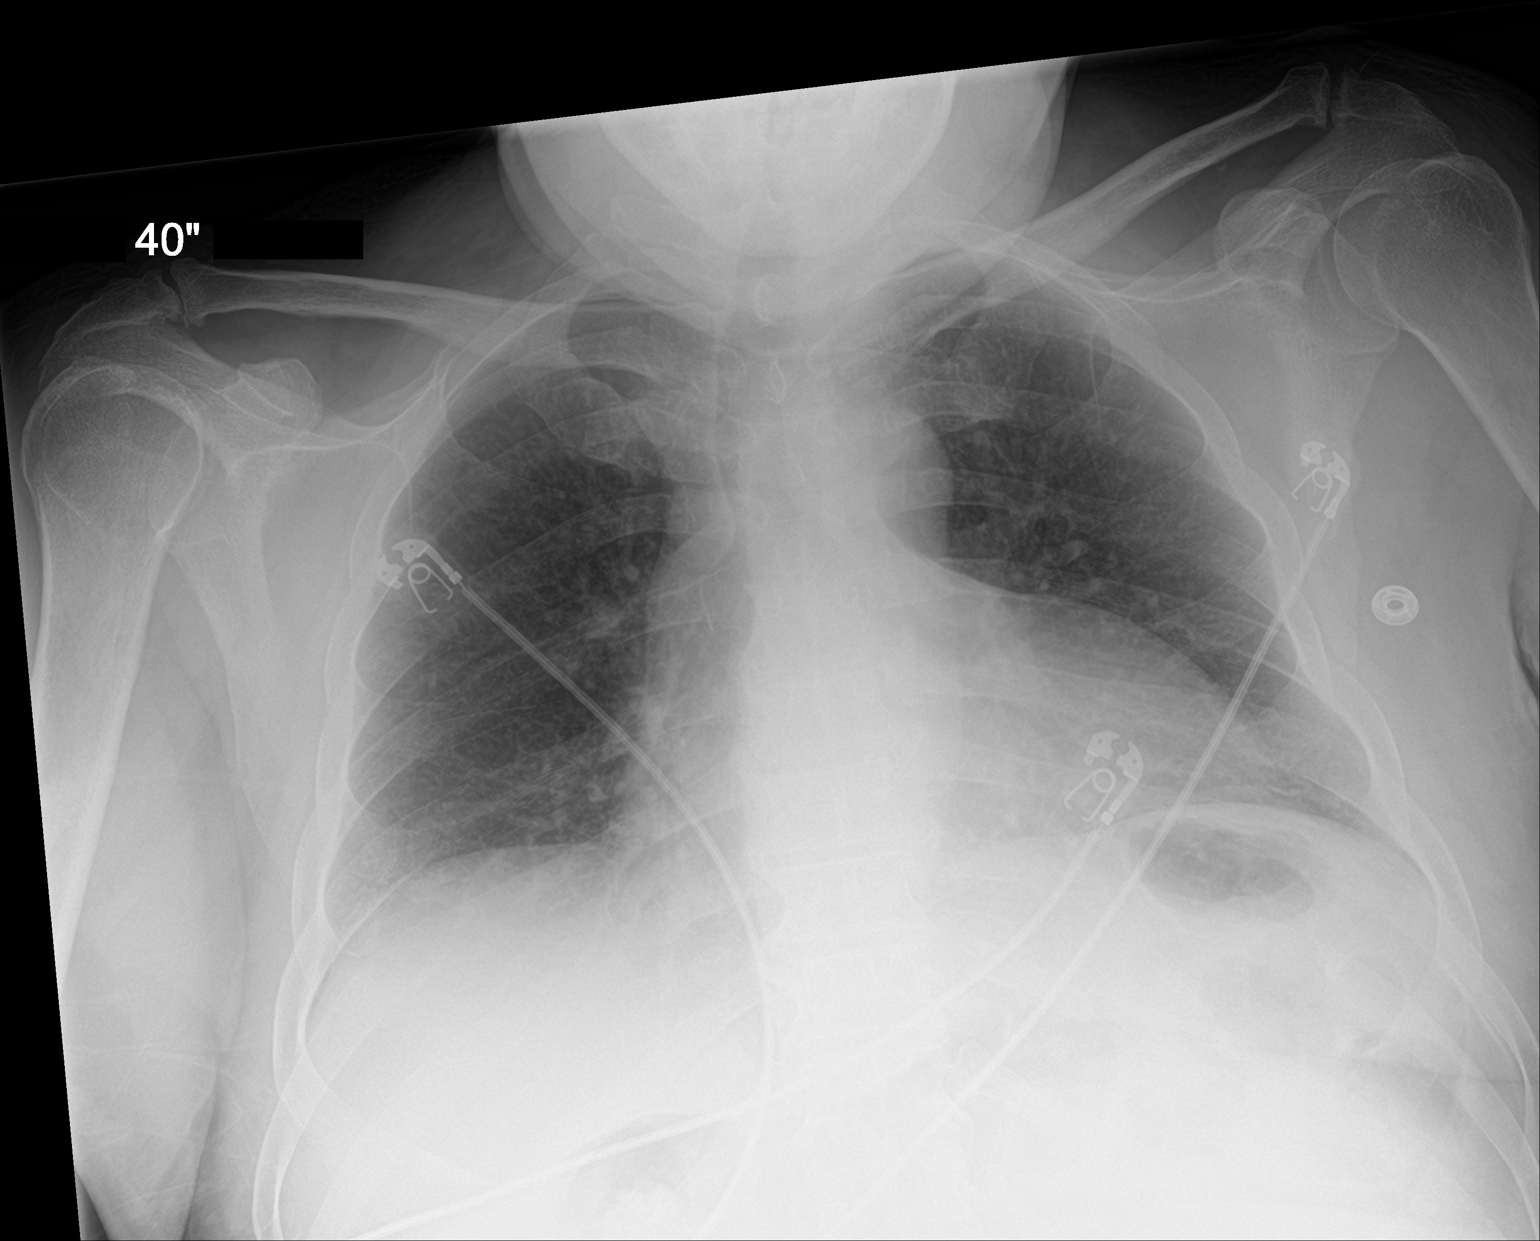

[2 of 2 positions shown; findings below may reference images not displayed]

FINDINGS: Lungs are clear. Heart is borderline enlarged with pulmonary
vascularity normal. No adenopathy. No bone lesions.
IMPRESSION: Borderline cardiac enlargement.  No edema or consolidation.

## 2020-12-04 IMAGING — MR MR MRA HEAD W/O CM
1 series · 19 of 48 positions shown · non-contrast
Comparison: MRI same day

CLINICAL DATA: Followup scattered acute infarctions

EXAM:
MRA HEAD WITHOUT CONTRAST
TECHNIQUE: Angiographic images of the Circle of Willis were obtained using MRA
technique without intravenous contrast.

[Series 3: (id) mt fs · axial · 1.4mm · 0.43mm/px · z∈[-40,+50]mm · 19 of 136 slices shown]
[im 1/136]
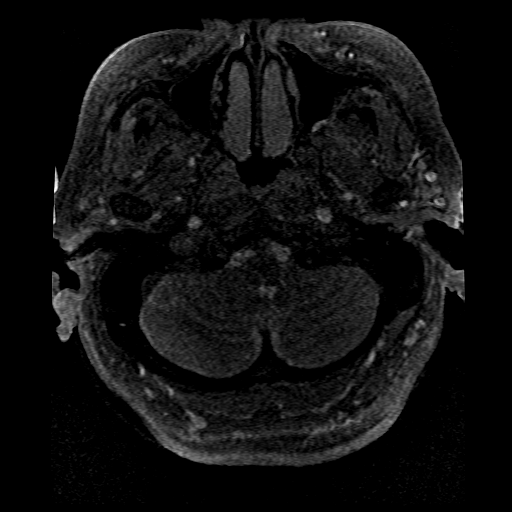
[im 3/136]
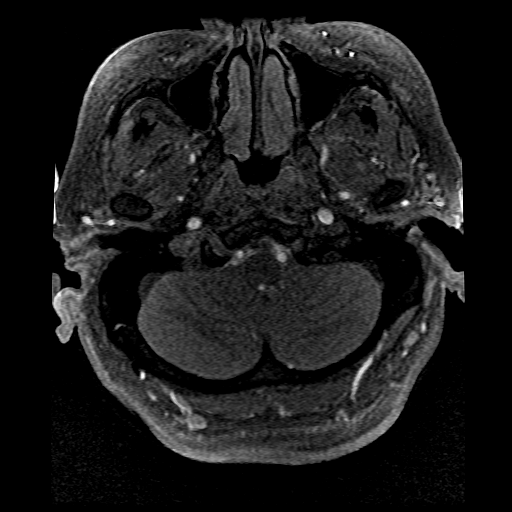
[im 6/136]
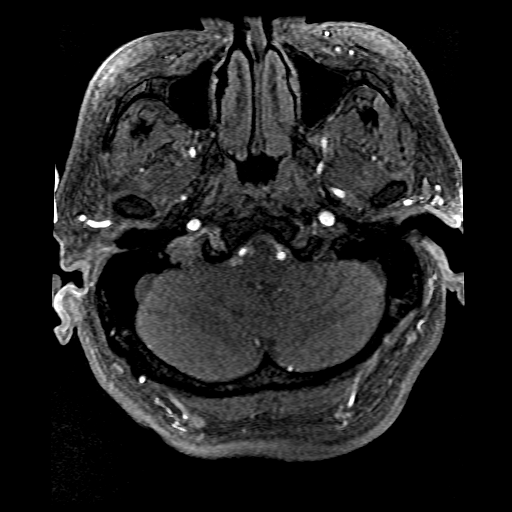
[im 9/136]
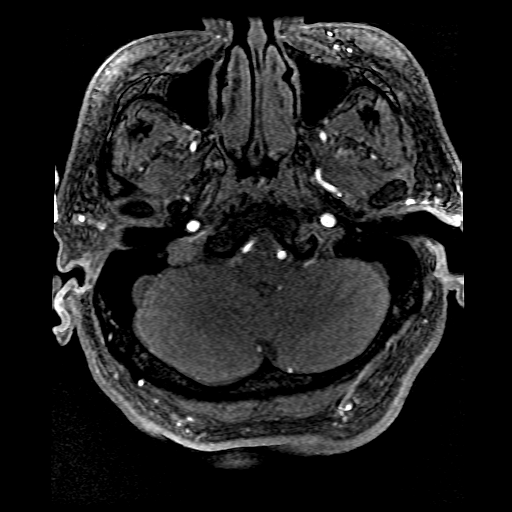
[im 12/136]
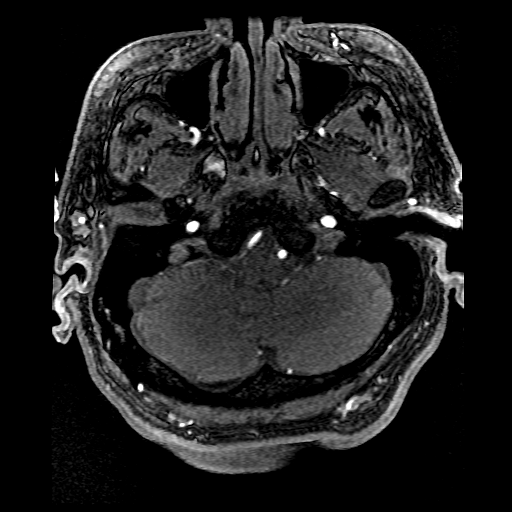
[im 15/136]
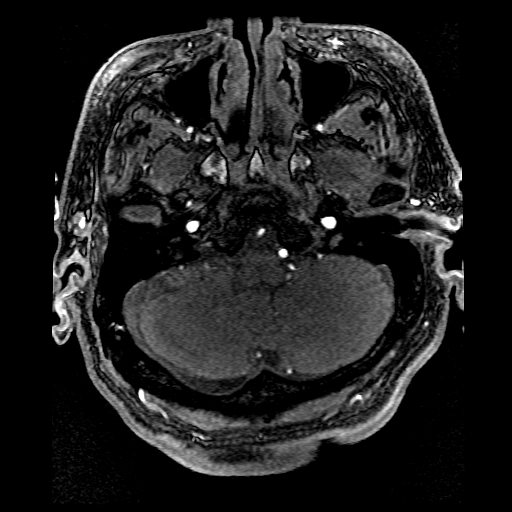
[im 18/136]
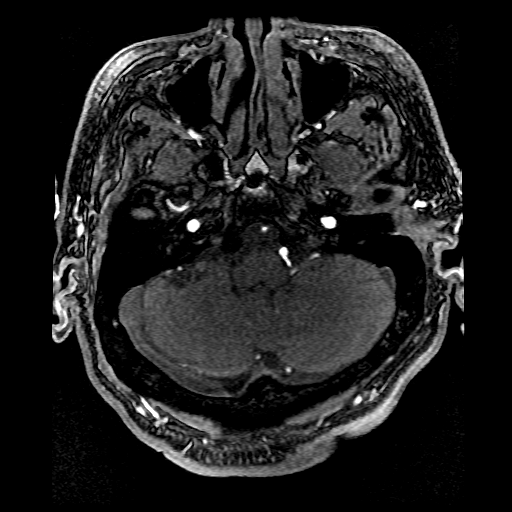
[im 21/136]
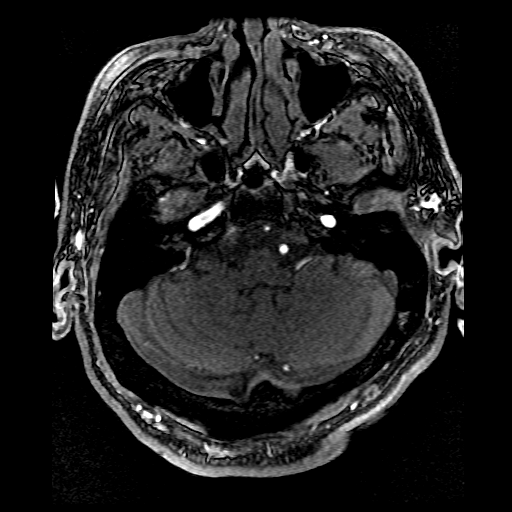
[im 23/136]
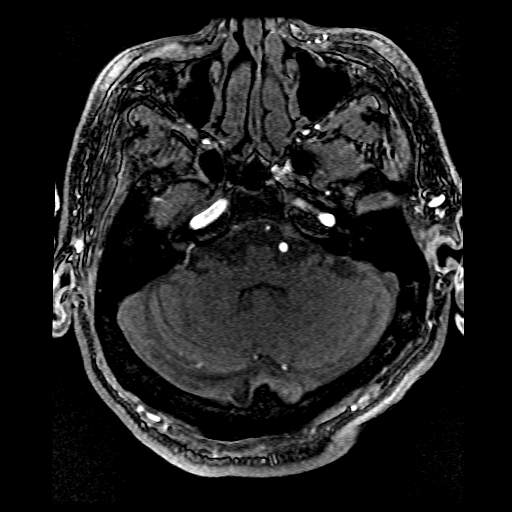
[im 26/136]
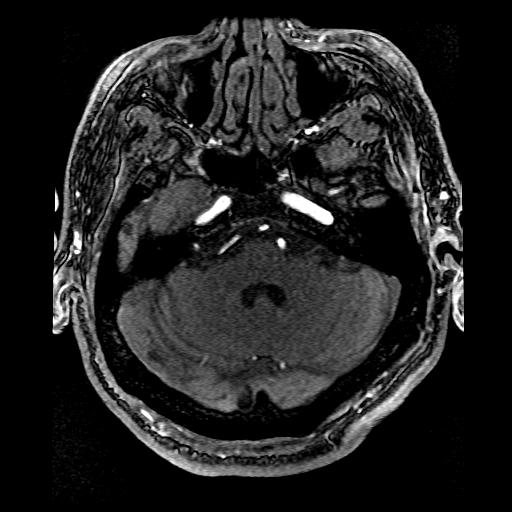
[im 29/136]
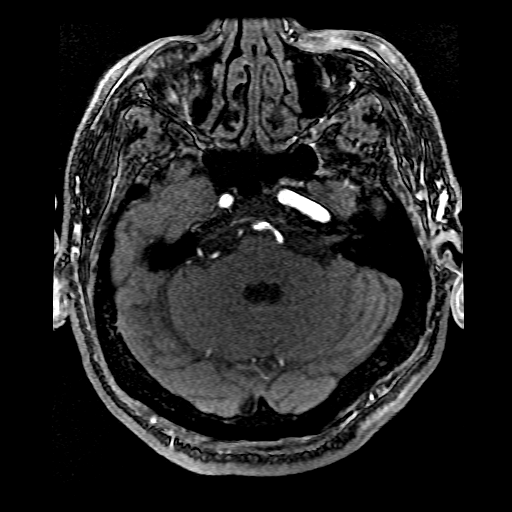
[im 44/136]
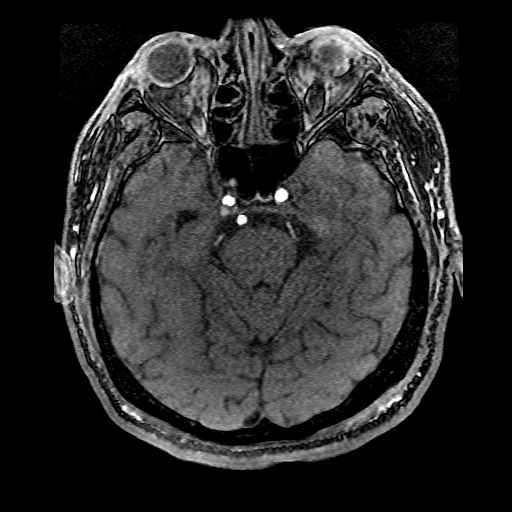
[im 61/136]
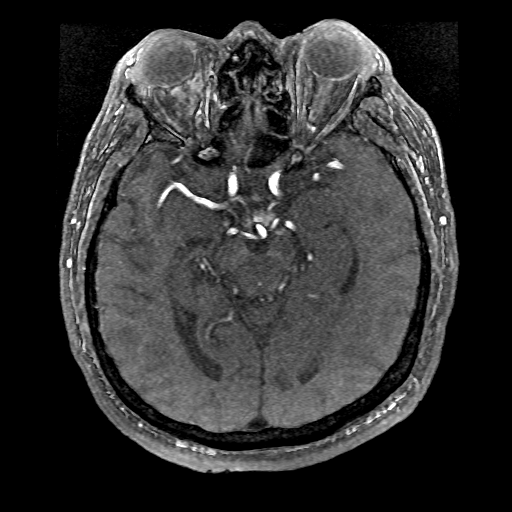
[im 69/136]
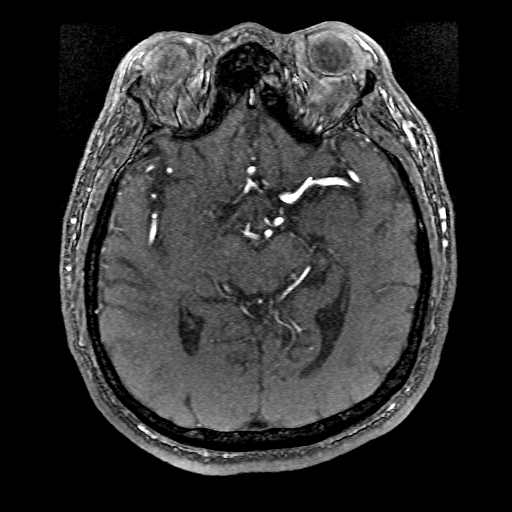
[im 78/136]
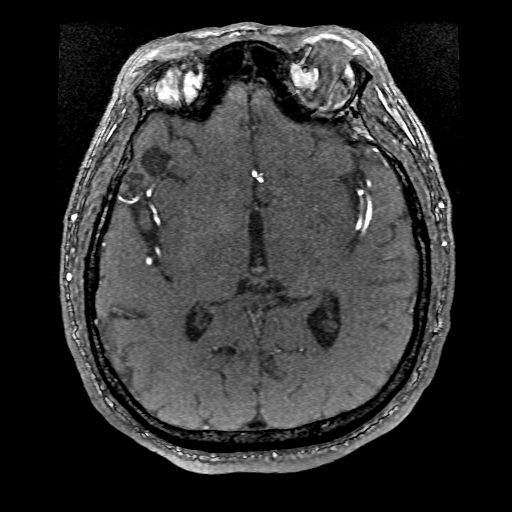
[im 95/136]
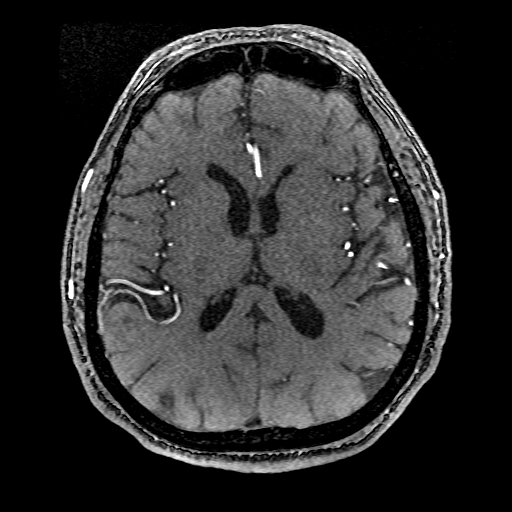
[im 113/136]
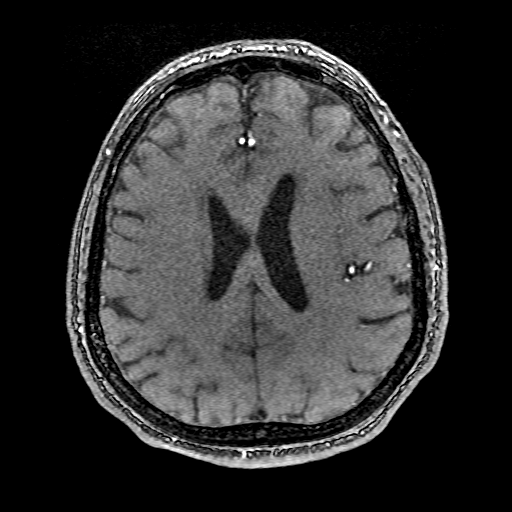
[im 115/136]
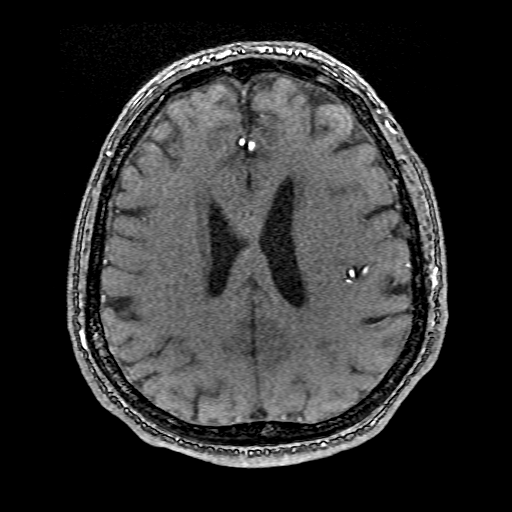
[im 130/136]
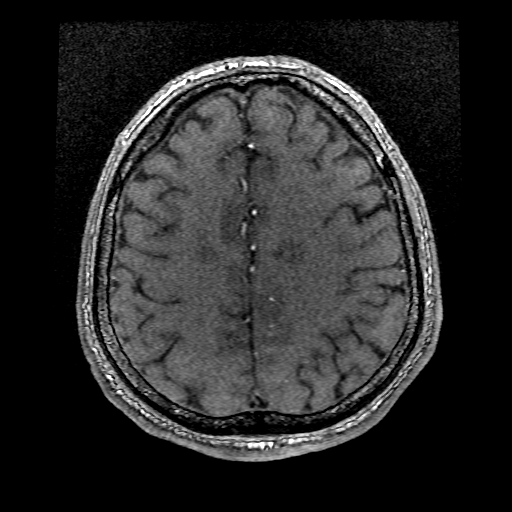

[19 of 48 positions shown; findings below may reference images not displayed]

FINDINGS: Both internal carotid arteries are patent through the skull base and
siphon regions. The anterior and middle cerebral vessels are patent.
There may be acute or subacute occlusion of the A1 segment on the
right. Question 3 mm aneurysm of the anterior communicating
region/proximal left A1 segment. I would consider CT angiography for
more accurate evaluation of this region. Middle cerebral vessels are
patent. There is mild stenosis and irregularity of the M1 segment on
the right, stenosis estimated at about 20%.

Both vertebral arteries are patent with the left being dominant.
There is atherosclerotic narrowing and irregularity of both V4
segments and of the proximal basilar artery. Proximal basilar
stenosis estimated at 50%. There is the possibility of a proximal
basilar dissection. Superior cerebellar and posterior cerebral
vessels show flow. There is stenosis of the P2 segment on the right,
50% or greater.
IMPRESSION: Multiple intracranial findings that may benefit from CT angiographic
evaluation. Possible acute or subacute occlusion of the A1 segment
on the right. Question aneurysm of the anterior communicating artery
region measuring up to 3 mm in size.

Narrowing and irregularity of the M1 segment on the right, estimated
severity about 20%.

Abnormal appearance of the proximal basilar artery raising the
possibility of a localized dissection. 50% stenosis in region.

50% stenosis of the P2 segment on the right.

## 2021-01-07 ENCOUNTER — Other Ambulatory Visit: Payer: Self-pay

## 2021-01-07 MED FILL — Hydralazine HCl Tab 50 MG: ORAL | 30 days supply | Qty: 90 | Fill #0 | Status: AC

## 2021-01-07 MED FILL — Atorvastatin Calcium Tab 40 MG (Base Equivalent): ORAL | 30 days supply | Qty: 30 | Fill #0 | Status: AC

## 2021-01-07 MED FILL — Glipizide Tab 5 MG: ORAL | 30 days supply | Qty: 30 | Fill #2 | Status: AC

## 2021-01-07 MED FILL — Amlodipine Besylate Tab 10 MG (Base Equivalent): ORAL | 30 days supply | Qty: 30 | Fill #0 | Status: AC

## 2021-04-23 ENCOUNTER — Other Ambulatory Visit: Payer: Self-pay

## 2021-04-23 MED FILL — Atorvastatin Calcium Tab 40 MG (Base Equivalent): ORAL | 30 days supply | Qty: 30 | Fill #0 | Status: CN

## 2021-04-23 MED FILL — Amlodipine Besylate Tab 10 MG (Base Equivalent): ORAL | 30 days supply | Qty: 30 | Fill #0 | Status: CN

## 2021-04-24 ENCOUNTER — Other Ambulatory Visit (HOSPITAL_COMMUNITY): Payer: Self-pay | Admitting: Nephrology

## 2021-04-24 DIAGNOSIS — N186 End stage renal disease: Secondary | ICD-10-CM

## 2021-04-29 ENCOUNTER — Other Ambulatory Visit: Payer: Self-pay | Admitting: Radiology

## 2021-04-30 ENCOUNTER — Encounter (HOSPITAL_COMMUNITY): Payer: Self-pay

## 2021-04-30 ENCOUNTER — Other Ambulatory Visit: Payer: Self-pay

## 2021-04-30 ENCOUNTER — Other Ambulatory Visit (HOSPITAL_COMMUNITY): Payer: Self-pay | Admitting: Nephrology

## 2021-04-30 ENCOUNTER — Ambulatory Visit (HOSPITAL_COMMUNITY)
Admission: RE | Admit: 2021-04-30 | Discharge: 2021-04-30 | Disposition: A | Discharge status: Home / Self Care | Payer: Self-pay | Source: Ambulatory Visit | Attending: Nephrology | Admitting: Nephrology

## 2021-04-30 DIAGNOSIS — E1122 Type 2 diabetes mellitus with diabetic chronic kidney disease: Secondary | ICD-10-CM | POA: Insufficient documentation

## 2021-04-30 DIAGNOSIS — Z87891 Personal history of nicotine dependence: Secondary | ICD-10-CM | POA: Insufficient documentation

## 2021-04-30 DIAGNOSIS — T829XXA Unspecified complication of cardiac and vascular prosthetic device, implant and graft, initial encounter: Secondary | ICD-10-CM | POA: Insufficient documentation

## 2021-04-30 DIAGNOSIS — N186 End stage renal disease: Secondary | ICD-10-CM

## 2021-04-30 DIAGNOSIS — I120 Hypertensive chronic kidney disease with stage 5 chronic kidney disease or end stage renal disease: Secondary | ICD-10-CM | POA: Insufficient documentation

## 2021-04-30 DIAGNOSIS — Y832 Surgical operation with anastomosis, bypass or graft as the cause of abnormal reaction of the patient, or of later complication, without mention of misadventure at the time of the procedure: Secondary | ICD-10-CM | POA: Insufficient documentation

## 2021-04-30 DIAGNOSIS — Z992 Dependence on renal dialysis: Secondary | ICD-10-CM | POA: Insufficient documentation

## 2021-04-30 HISTORY — PX: IR DIALY SHUNT INTRO NEEDLE/INTRACATH INITIAL W/IMG LEFT: IMG6102

## 2021-04-30 LAB — GLUCOSE, CAPILLARY: Glucose-Capillary: 215 mg/dL — ABNORMAL HIGH (ref 70–99)

## 2021-04-30 MED ORDER — LIDOCAINE HCL 1 % IJ SOLN
INTRAMUSCULAR | Status: AC
  Administered 2021-04-30: 5 mL
  Filled 2021-04-30: qty 20

## 2021-04-30 MED ORDER — IOHEXOL 300 MG/ML  SOLN
100.0000 mL | Freq: Once | INTRAMUSCULAR | Status: AC | PRN
Start: 2021-04-30 — End: 2021-04-30
  Administered 2021-04-30: 20 mL via INTRAVENOUS

## 2021-04-30 MED ORDER — SODIUM CHLORIDE 0.9 % IV SOLN: INTRAVENOUS | Status: DC

## 2021-04-30 NOTE — Procedures (Signed)
Interventional Radiology Procedure Note  Procedure: Diagnostic LUE Fistulagram  Indication: Poor flow at HD  Findings: Please refer to procedural dictation for full description.  Complications: None  EBL: < 10 mL  Max Roux, MD [phone]

## 2021-04-30 NOTE — Sedation Documentation (Signed)
No sedation needed due to no intervention needed at this time.

## 2021-04-30 NOTE — Sedation Documentation (Signed)
Reviewed with patient via interpreter, Max Sanchez, that he can continue dialysis as usual and he may eat per his regular diet. Patient was ambulatory and asked to walk out rather than ride in wheelchair. Patient's daughter was called and is in the main waiting area. Interpreter also walked out with patient.  Nothing further needed at this time. Patient discharged.

## 2021-04-30 NOTE — H&P (Addendum)
Chief Complaint: Patient was seen in consultation today for left arm dialysis fistula evaluation/intervention at the request of [place]  Referring Physician(s): [place]  Supervising Physician: Mir, Sharen Heck  Patient Status: [facility] - Out-pt  History of Present Illness: Max Sanchez is a 56 y.o. male   ESRD Left upper arm fistula placed 11/2018 Slow flow ; low access flow in dialysis per Dr Hollie Salk Pt states he does not know that anything is wrong with fistula----used yesterday in dialysis and seemed to work well Denies pain; swelling  Here today for evaluation and possible intervention    Past Medical History:  Diagnosis Date   Diabetic neuropathy ([place])    ESRD (end stage renal disease) ([place]) 03/10/2018   Dialysis TTHSat   History of CVA (cerebrovascular accident) 03/10/2018   Hyperlipidemia 03/19/2018   Hypertension 03/10/2018   Hypertensive retinopathy of left eye    Proliferative diabetic retinopathy of both 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 ([facility])    Stroke ([place]) 03/2018   Type 2 diabetes mellitus with complication, without long-term current use of insulin [facility])     Past Surgical History:  Procedure Laterality Date   A/V FISTULAGRAM Left 02/07/2019   Procedure: A/V FISTULAGRAM;  Surgeon: Waynetta Sandy, MD;  Location: [place] [facility];  Service: Cardiovascular;  Laterality: Left;   AV FISTULA PLACEMENT Left 11/22/2018   Procedure: ARTERIOVENOUS (AV) FISTULA CREATION LEFT ARM;  Surgeon: Rosetta Posner, MD;  Location: [place];  Service: Vascular;  Laterality: Left;   [place] Left 02/16/2019   Procedure: First and Second Stage Bascilic Vein Transposition Left Arm;  Surgeon: Waynetta Sandy, MD;  Location: [place];  Service: Vascular;  Laterality: Left;   EYE SURGERY Bilateral    FOOT SURGERY     INSERTION OF DIALYSIS CATHETER Right 11/22/2018   Procedure: INSERTION OF TUNNELED  DIALYSIS CATHETER, right internal  jugular;  Surgeon: Rosetta Posner, MD;  [facility];  Service: Vascular;  Laterality: Right;   IR FLUORO GUIDE CV LINE RIGHT  11/16/2018   IR REMOVAL TUN CV CATH W/O FL  04/07/2019   IR US GUIDE VASC ACCESS RIGHT  11/16/2018   PR RPR COMPLEX RETINA DETACH VITRECT &MEMBRANE PEEL Left 10/01/2016   PR RPR COMPLEX RETINA DETACH VITRECT &MEMBRANE PEEL Right 12/03/2016   PR RPR COMPLEX RETINA DETACH VITRECT &MEMBRANE PEEL Right 04/01/2017   PR XCAPSL CTRC RMVL INSJ IO LENS PROSTH W/O ECP Right 12/03/2016    Allergies: Patient has no known allergies.  Medications: Prior to Admission medications   Medication Sig Start Date End Date Taking? Authorizing Provider  amLODipine (NORVASC) 10 MG tablet Take 10 mg by mouth at bedtime.  01/11/19   [provider]  amLODipine (NORVASC) 10 MG tablet TAKE 1 TABLET BY MOUTH DAILY. 05/03/20 05/03/21  Madelon Lips, MD  aspirin EC 81 MG tablet Take 81 mg by mouth daily.    [provider]  atorvastatin (LIPITOR) 40 MG tablet TAKE 1 TABLET BY MOUTH DAILY. 05/03/20 05/03/21  Madelon Lips, MD  AURYXIA 1 GM 210 MG(Fe) tablet Take 210 mg by mouth 3 (three) times daily. 01/11/19   [provider]  B Complex-C-Folic Acid (RENA-VITE RX) 1 MG TABS Take 1 tablet by mouth daily. 01/21/19   [provider]  glipiZIDE (GLUCOTROL XL) 5 MG 24 hr tablet Take 1 tablet (5 mg total) by mouth daily with breakfast. Patient not taking: Reported on 02/09/2019 04/22/18   Scot Jun, FNP  glipiZIDE (GLUCOTROL) 5 MG tablet Take 5 mg by mouth every morning. 01/21/19   [provider]  glipiZIDE (GLUCOTROL) 5 MG tablet TAKE 1 TABLET BY MOUTH DAILY WITH BREAKFAST. 05/03/20 05/03/21  Madelon Lips, MD  hydrALAZINE (APRESOLINE) 50 MG tablet Take 50 mg by mouth 3 (three) times daily.  01/11/19   [provider]  hydrALAZINE (APRESOLINE) 50 MG tablet TAKE 1 TABLET BY MOUTH EVERY 8 HOURS 05/03/20 05/03/21  Madelon Lips, MD  Multiple  Vitamins-Minerals (MULTIVITAMIN WITH MINERALS) tablet Take 1 tablet by mouth daily.    [provider]  multivitamin (RENA-VIT) TABS tablet Take 1 tablet by mouth daily. 01/07/19   [provider]     Family History  Problem Relation Age of Onset   Diabetes Father    Diabetes Brother    Kidney disease Brother     Social History   Socioeconomic History   Marital status: Married    Spouse name: Not on file   Number of children: Not on file   Years of education: Not on file   Highest education level: Not on file  Occupational History   Not on file  Tobacco Use   Smoking status: Former    Years: 7.00    Types: Cigarettes    Quit date: 2017    Years since quitting: 6.1   Smokeless tobacco: Never   Tobacco comments:    10 pack year history; quit 2 years ago  Vaping Use   Vaping Use: Never used  Substance and Sexual Activity   Alcohol use: Not Currently    Comment: stopped 2 years ago   Drug use: Not Currently    Comment: tried cocaine and marijuana when younger   Sexual activity: Not on file  Other Topics Concern   Not on file  Social History Narrative   Not on file   Social Determinants of Health   Financial Resource Strain: Not on file  Food Insecurity: Not on file  Transportation Needs: Not on file  Physical Activity: Not on file  Stress: Not on file  Social Connections: Not on file     Review of Systems: A 12 point ROS discussed and pertinent positives are indicated in the HPI above.  All other systems are negative.  Review of Systems  Constitutional:  Negative for activity change and fatigue.  Respiratory:  Negative for cough and shortness of breath.   Cardiovascular:  Negative for chest pain.  Psychiatric/Behavioral:  Negative for behavioral problems and confusion.    Vital Signs: BP (!) 186/97    Pulse 86    Temp 98.2 F (36.8 C) (Oral)    Resp 18    Ht 5' 2.99" (1.6 m)    Wt 176 lb 5.9 oz (80 kg)    SpO2 100%    BMI 31.25 kg/m    Physical Exam Vitals reviewed.  HENT:     Mouth/Throat:     Mouth: Mucous membranes are moist.  Cardiovascular:     Rate and Rhythm: Normal rate and regular rhythm.     Heart sounds: Normal heart sounds.  Pulmonary:     Effort: Pulmonary effort is normal.     Breath sounds: Normal breath sounds.  Abdominal:     Palpations: Abdomen is soft.  Musculoskeletal:        General: Normal range of motion.     Comments: Left upper arm fistula Good thrill Good pulse   Skin:    General: Skin is warm.  Neurological:  Mental Status: He is alert and oriented to person, place, and time.  Psychiatric:        Behavior: Behavior normal.     Comments: Interpreter at bedside    Imaging: No results found.  Labs:  CBC: No results for input(s): WBC, HGB, HCT, PLT in the last 8760 hours.  COAGS: No results for input(s): INR, APTT in the last 8760 hours.  BMP: No results for input(s): NA, K, CL, CO2, GLUCOSE, BUN, CALCIUM, CREATININE, GFRNONAA, GFRAA in the last 8760 hours.  Invalid input(s): CMP  LIVER FUNCTION TESTS: No results for input(s): BILITOT, AST, ALT, ALKPHOS, PROT, ALBUMIN in the last 8760 hours.  TUMOR MARKERS: No results for input(s): AFPTM, CEA, CA199, CHROMGRNA in the last 8760 hours.  Assessment and Plan:  ESRD Scheduled today for evaluation and possible intervention of left upper arm dialysis fistula secondary low access flows Pt is aware of procedure benefits and risks including but not limited to Infection; bleeding; damage to structure He is agreeable to proceed Consent signed with Interpreter at bedside  Thank you for this interesting consult.  I greatly enjoyed meeting Patrik Turnbaugh and look forward to participating in their care.  A copy of this report was sent to the requesting provider on this date.  Electronically Signed: Lavonia Drafts, PA-C 04/30/2021, 8:36 AM   I spent a total of  30 Minutes   in face to face in clinical consultation,  greater than 50% of which was counseling/coordinating care for left upper arm dialysis fistula evaluation

## 2021-05-07 ENCOUNTER — Encounter (HOSPITAL_COMMUNITY): Payer: Self-pay

## 2021-05-07 HISTORY — PX: IR US GUIDE VASC ACCESS LEFT: IMG2389

## 2021-06-10 ENCOUNTER — Other Ambulatory Visit: Payer: Self-pay

## 2021-06-13 ENCOUNTER — Other Ambulatory Visit: Payer: Self-pay

## 2021-06-17 ENCOUNTER — Other Ambulatory Visit: Payer: Self-pay

## 2021-06-18 ENCOUNTER — Ambulatory Visit: Admission: EM | Admit: 2021-06-18 | Discharge: 2021-06-18 | Discharge status: Against Medical Advice | Payer: Self-pay

## 2021-06-20 ENCOUNTER — Encounter: Payer: Self-pay | Admitting: Physician Assistant

## 2021-06-20 ENCOUNTER — Ambulatory Visit (INDEPENDENT_AMBULATORY_CARE_PROVIDER_SITE_OTHER): Payer: Self-pay | Admitting: Physician Assistant

## 2021-06-20 ENCOUNTER — Other Ambulatory Visit: Payer: Self-pay

## 2021-06-20 VITALS — BP 215/110 | HR 88 | Resp 19 | Ht 66.0 in | Wt 174.8 lb

## 2021-06-20 DIAGNOSIS — Z992 Dependence on renal dialysis: Secondary | ICD-10-CM

## 2021-06-20 DIAGNOSIS — Z8673 Personal history of transient ischemic attack (TIA), and cerebral infarction without residual deficits: Secondary | ICD-10-CM

## 2021-06-20 DIAGNOSIS — I160 Hypertensive urgency: Secondary | ICD-10-CM

## 2021-06-20 DIAGNOSIS — I10 Essential (primary) hypertension: Secondary | ICD-10-CM

## 2021-06-20 DIAGNOSIS — D631 Anemia in chronic kidney disease: Secondary | ICD-10-CM

## 2021-06-20 DIAGNOSIS — E1122 Type 2 diabetes mellitus with diabetic chronic kidney disease: Secondary | ICD-10-CM

## 2021-06-20 DIAGNOSIS — N186 End stage renal disease: Secondary | ICD-10-CM

## 2021-06-20 LAB — POCT GLYCOSYLATED HEMOGLOBIN (HGB A1C): Hemoglobin A1C: 12.3 % — AB (ref 4.0–5.6)

## 2021-06-20 MED ORDER — AMLODIPINE BESYLATE 10 MG PO TABS
10.0000 mg | ORAL_TABLET | Freq: Every day | ORAL | 1 refills | Status: DC
  Filled 2021-06-20: qty 30, 30d supply, fill #0

## 2021-06-20 MED ORDER — HYDRALAZINE HCL 50 MG PO TABS
ORAL_TABLET | Freq: Three times a day (TID) | ORAL | 2 refills | Status: DC
  Filled 2021-06-20: qty 90, 30d supply, fill #0
  Filled 2021-11-14: qty 90, 30d supply, fill #1
  Filled 2022-01-20: qty 90, 30d supply, fill #2
  Filled 2022-03-14 – 2022-03-24 (×2): qty 90, 30d supply, fill #3
  Filled 2022-05-19 (×2): qty 90, 30d supply, fill #4

## 2021-06-20 MED ORDER — GLIPIZIDE 5 MG PO TABS
ORAL_TABLET | Freq: Every day | ORAL | 3 refills | Status: DC
  Filled 2021-06-20: qty 30, 30d supply, fill #0

## 2021-06-20 MED ORDER — ATORVASTATIN CALCIUM 40 MG PO TABS
40.0000 mg | ORAL_TABLET | Freq: Every day | ORAL | 1 refills | Status: DC
  Filled 2021-06-20: qty 30, 30d supply, fill #0

## 2021-06-20 NOTE — Progress Notes (Addendum)
? ?New Patient Office Visit ? ?Subjective   ? ?Patient ID: Max Sanchez, male    DOB: 1965/12/21  Age: 56 y.o. MRN: [id number] ? ?CC:  ?Chief Complaint  ?Patient presents with  ? Medication Refill  ?  Dm/htn  ? ? ?HPI ?[name] presents for medication refills.  Patient has not been seen in this office since February 2020.  States that he started dialysis in September 2020, states that he goes there on Monday Wednesday and Fridays.  States that they have been prescribing his medication but told him that they were unable to continue prescribing it and he needed to follow-up with the primary care provider.  States that he has been out of his medications for the past 2 months. ? ?States that he did have dialysis yesterday.  States that he was told that his blood pressure was "fine" yesterday.  Denies any hypertensive symptoms. ? ?Review of vital signs from dialysis yesterday: ? ?VITAL SIGNS ? ?VITAL SIGNS - Post-Treatment Vital Signs ?Post-Treatment Vital Signs ?Vital Sign Value Date / Time  ?Blood Pressure-sitting 188/97 mmHg June 19, 2021 06:04 AM  ?Blood Pressure-standing 156/82 mmHg June 19, 2021 06:04 AM  ?Heart Rate 78 beats per minute June 19, 2021 06:04 AM  ?Respiratory Rate 15 breaths per minute June 19, 2021 06:04 AM  ?Temperature 97.8 deg. F June 19, 2021 06:04 AM  ? ?Due to language barrier, an interpreter was present during the history-taking and subsequent discussion (and for part of the physical exam) with this patient. ? ?Outpatient Encounter Medications as of 06/20/2021  ?Medication Sig  ? amLODipine (NORVASC) 10 MG tablet Take 1 tablet (10 mg total) by mouth at bedtime.  ? aspirin EC 81 MG tablet Take 81 mg by mouth daily. (Patient not taking: Reported on 06/20/2021)  ? atorvastatin (LIPITOR) 40 MG tablet TAKE 1 TABLET BY MOUTH DAILY.  ? AURYXIA 1 GM 210 MG(Fe) tablet Take 210 mg by mouth 3 (three) times daily. (Patient not taking: Reported on 06/20/2021)  ? B Complex-C-Folic Acid (RENA-VITE  RX) 1 MG TABS Take 1 tablet by mouth daily. (Patient not taking: Reported on 06/20/2021)  ? glipiZIDE (GLUCOTROL) 5 MG tablet TAKE 1 TABLET BY MOUTH DAILY WITH BREAKFAST.  ? hydrALAZINE (APRESOLINE) 50 MG tablet TAKE 1 TABLET BY MOUTH EVERY 8 HOURS  ? Multiple Vitamins-Minerals (MULTIVITAMIN WITH MINERALS) tablet Take 1 tablet by mouth daily. (Patient not taking: Reported on 06/20/2021)  ? multivitamin (RENA-VIT) TABS tablet Take 1 tablet by mouth daily. (Patient not taking: Reported on 06/20/2021)  ? [DISCONTINUED] amLODipine (NORVASC) 10 MG tablet Take 10 mg by mouth at bedtime.  (Patient not taking: Reported on 06/20/2021)  ? [DISCONTINUED] amLODipine (NORVASC) 10 MG tablet TAKE 1 TABLET BY MOUTH DAILY.  ? [DISCONTINUED] atorvastatin (LIPITOR) 40 MG tablet TAKE 1 TABLET BY MOUTH DAILY.  ? [DISCONTINUED] glipiZIDE (GLUCOTROL XL) 5 MG 24 hr tablet Take 1 tablet (5 mg total) by mouth daily with breakfast. (Patient not taking: Reported on 02/09/2019)  ? [DISCONTINUED] glipiZIDE (GLUCOTROL) 5 MG tablet Take 5 mg by mouth every morning. (Patient not taking: Reported on 06/20/2021)  ? [DISCONTINUED] glipiZIDE (GLUCOTROL) 5 MG tablet TAKE 1 TABLET BY MOUTH DAILY WITH BREAKFAST.  ? [DISCONTINUED] hydrALAZINE (APRESOLINE) 50 MG tablet Take 50 mg by mouth 3 (three) times daily.  (Patient not taking: Reported on 06/20/2021)  ? [DISCONTINUED] hydrALAZINE (APRESOLINE) 50 MG tablet TAKE 1 TABLET BY MOUTH EVERY 8 HOURS  ? ?No facility-administered encounter medications on file as of  06/20/2021.  ? ? ?Past Medical History:  ?Diagnosis Date  ? Diabetic neuropathy ([place])   ? ESRD (end stage renal disease) ([place]) 03/10/2018  ? Dialysis TTHSat  ? History of CVA (cerebrovascular accident) 03/10/2018  ? Hyperlipidemia 03/19/2018  ? Hypertension 03/10/2018  ? Hypertensive retinopathy of left eye   ? 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 Stroke [facility]) 03/2018  ? Type 2 diabetes mellitus with  complication, without long-term current use of insulin ([place])   ? ? ?Past Surgical History:  ?Procedure Laterality Date  ? A/V FISTULAGRAM Left 02/07/2019  ? Procedure: A/V FISTULAGRAM;  Surgeon: Waynetta Sandy, MD;  Location: [place] [facility];  Service: Cardiovascular;  Laterality: Left;  ? AV FISTULA PLACEMENT Left 11/22/2018  ? Procedure: ARTERIOVENOUS (AV) FISTULA CREATION LEFT ARM;  Surgeon: Rosetta Posner, MD;  Location: [place];  Service: Vascular;  Laterality: Left;  ? BASCILIC VEIN TRANSPOSITION Left 02/16/2019  ? Procedure: First and Second Stage Bascilic Vein Transposition Left Arm;  Surgeon: Waynetta Sandy, MD;  Location: [place];  Service: Vascular;  Laterality: Left;  ? EYE SURGERY Bilateral   ? FOOT SURGERY    ? INSERTION OF DIALYSIS CATHETER Right 11/22/2018  ? Procedure: INSERTION OF TUNNELED  DIALYSIS CATHETER, right internal jugular;  Surgeon: Rosetta Posner, MD;  [facility];  Service: Vascular;  Laterality: Right;  ? IR DIALY SHUNT INTRO NEEDLE/INTRACATH INITIAL W/IMG LEFT Left 04/30/2021  ? IR FLUORO GUIDE CV LINE RIGHT  11/16/2018  ? IR REMOVAL TUN CV CATH W/O FL  04/07/2019  ? IR US GUIDE VASC ACCESS LEFT  05/07/2021  ? IR US GUIDE VASC ACCESS RIGHT  11/16/2018  ? PR RPR COMPLEX RETINA DETACH VITRECT &MEMBRANE PEEL Left 10/01/2016  ? PR RPR COMPLEX RETINA DETACH VITRECT &MEMBRANE PEEL Right 12/03/2016  ? PR RPR COMPLEX RETINA DETACH VITRECT &MEMBRANE PEEL Right 04/01/2017  ? PR XCAPSL CTRC RMVL INSJ IO LENS PROSTH W/O ECP Right 12/03/2016  ? ? ?Family History  ?Problem Relation Age of Onset  ? Diabetes Father   ? Diabetes Brother   ? Kidney disease Brother   ? ? ?Social History  ? ?Socioeconomic History  ? Marital status: Married  ?  Spouse name: Not on file  ? Number of children: Not on file  ? Years of education: Not on file  ? Highest education level: Not on file  ?Occupational History  ? Not on file  ?Tobacco Use  ? Smoking status: Former  ?  Years: 7.00  ?  Types: Cigarettes   ?  Quit date: 2017  ?  Years since quitting: 6.3  ? Smokeless tobacco: Never  ? Tobacco comments:  ?  10 pack year history; quit 2 years ago  ?Vaping Use  ? Vaping Use: Never used  ?Substance and Sexual Activity  ? Alcohol use: Not Currently  ?  Comment: stopped 2 years ago  ? Drug use: Not Currently  ?  Comment: tried cocaine and marijuana when younger  ? Sexual activity: Not on file  ?Other Topics Concern  ? Not on file  ?Social History Narrative  ? Not on file  ? ?Social Determinants of Health  ? ?Financial Resource Strain: Not on file  ?Food Insecurity: Not on file  ?Transportation Needs: Not on file  ?Physical Activity: Not on file  ?Stress: Not on file  ?Social Connections: Not on file  ?Intimate Partner Violence: Not on file  ? ? ?  Review of Systems  ?Constitutional:  Negative for chills and fever.  ?HENT: Negative.    ?Eyes:  Negative for blurred vision.  ?Respiratory:  Negative for shortness of breath.   ?Cardiovascular:  Negative for chest pain and leg swelling.  ?Gastrointestinal: Negative.   ?Genitourinary: Negative.   ?Musculoskeletal: Negative.   ?Skin: Negative.   ?Neurological:  Negative for speech change, loss of consciousness, weakness and headaches.  ?Endo/Heme/Allergies: Negative.   ?Psychiatric/Behavioral: Negative.    ? ?  ? ? ?Objective   ? ?BP (!) 215/110 (BP Location: Right Arm, Patient Position: Sitting, Cuff Size: Normal)   Pulse 88   Resp 19   Ht 5\' 6"  (1.676 m)   Wt 174 lb 12.8 oz (79.3 kg)   SpO2 93%   BMI 28.21 kg/m?  ? ?Physical Exam ?Vitals and nursing note reviewed.  ?Constitutional:   ?   General: He is not in acute distress. ?   Appearance: Normal appearance. He is not ill-appearing.  ?HENT:  ?   Head: Normocephalic and atraumatic.  ?   Right Ear: External ear normal.  ?   Left Ear: External ear normal.  ?   Nose: Nose normal.  ?   Mouth/Throat:  ?   Mouth: Mucous membranes are moist.  ?   Pharynx: Oropharynx is clear.  ?Eyes:  ?   Extraocular Movements: Extraocular  movements intact.  ?   Conjunctiva/sclera: Conjunctivae normal.  ?   Pupils: Pupils are equal, round, and reactive to light.  ?Cardiovascular:  ?   Rate and Rhythm: Normal rate and regular rhythm.  ?   Pulses: Normal

## 2021-06-20 NOTE — Progress Notes (Signed)
Pt has eaten today. Pt seeking medication refills and states he has been out of his medications for 2 months. Pt denies pain.  ?

## 2021-06-20 NOTE — Patient Instructions (Signed)
Your refills have been sent to the pharmacy.  It is important that you check your blood pressure at home, keep a written log and have available for all office visits. ? ?You will return Tuesday morning for fasting labs to be completed.  We will call you with the results when they are available. ? ?Kennieth Rad, PA-C ?Physician Assistant ?[place] ?[email or web address] ? ? ?C?mo tomarse la presi?n arterial ?How to Take Your Blood Pressure ?La presi?n arterial es la medida de la fuerza de la sangre al presionar contra las paredes de las arterias. Las arterias son los vasos sangu?neos que transportan la sangre desde el coraz?n hacia todas las partes del cuerpo. Su m?dico toma su presi?n arterial en cada visita al consultorio. Usted tambi?n puede tomarse la presi?n arterial en casa con un tensi?metro. ?Es posible que deba tomarse la presi?n arterial: ?Para confirmar un diagn?stico de presi?n arterial elevada (hipertensi?n). ?Para vigilar su presi?n arterial a lo largo del tiempo. ?Para asegurarse de que el medicamento que toma para la presi?n arterial est? surtiendo Insurance claims handler. ?Materiales necesarios: ?Monitor para medir la presi?n arterial. ?Una silla para sentarse. Debe ser Ardelia Mems silla en la que pueda sentarse erguido con la espalda apoyada. No se siente en un sill?n blando o sof?Marland Kitchen ?Mesa o escritorio. ?Cuaderno peque?o y l?piz o bol?grafo. ?C?mo prepararse ?Para obtener la lectura m?s precisa, evite realizar lo siguiente durante los 30 minutos previos a Chief Technology Officer su presi?n arterial: ?Beber cafe?na. ?Consumir alcohol. ?Comer. ?Fumar. ?Realizar actividad f?sica. ?Cinco minutos antes de controlar su presi?n arterial: ?Vaya al ba?o y orine para tener la vejiga vac?a. ?Si?ntese tranquilamente en una silla. No hable. ?C?mo tomarse la presi?n arterial ?Para controlar su presi?n arterial, siga las instrucciones presentes en el manual que se incluye con el tensi?metro. Si  tiene Musician, las instrucciones podr?an ser las siguientes: ?Si?ntese derecho en una silla. ?Coloque los pies en el piso. No cruce los tobillos ni las piernas. ?Apoye su brazo izquierdo al nivel de su coraz?n en una mesa o escritorio, o en el brazo de la silla. ?Arrem?nguese. ?Envuelva la parte superior de su brazo izquierdo, 1 pulgada (2.5 cm) sobre su codo, con el brazalete. Es mejor Production manager brazalete alrededor de la piel [place]. ?Ajuste el brazalete ce?idamente, pero no demasiado apretado, alrededor del brazo. Debe poder meter ?nicamente un dedo entre el brazalete y Water engineer. ?Coloque el cord?n de modo que quede apoyado en el pliegue del codo. ?Presione el bot?n de encendido. ?Permanezca sentado tranquilamente mientras el brazalete se infla y se desinfla. ?Lea la lectura digital que aparece en la pantalla del tensi?metro y anote los n?meros (reg?strelos) en un cuaderno. ?Espere de 2 a 3 minutos, y luego repita los pasos desde el paso 1. ??Qu? significa mi lectura de presi?n arterial? ?Una lectura de la presi?n arterial consta de un n?mero m?s alto sobre un n?mero m?s bajo. En condiciones ideales, la presi?n arterial debe estar por debajo de 120/80. El primer n?mero (?superior?) es la presi?n sist?lica. Es la medida de la presi?n de las arterias cuando el coraz?n late. El segundo n?mero (?inferior?) es la presi?n diast?lica. Es la medida de la presi?n en las arterias cuando el coraz?n se relaja. ?La presi?n arterial se clasifica en cuatro etapas. Las siguientes son las etapas para adultos que no tienen enfermedad grave de corto plazo o una afecci?n cr?nica. La presi?n sist?lica y la presi?n diast?lica se miden en una unidad llamada mm Hg (mil?metros de mercurio).  ?Normal ?Presi?n  sist?lica: por debajo de 627. ?Presi?n diast?lica: por debajo de 80. ?Elevada ?Presi?n sist?lica: [phone]. ?Presi?n diast?lica: por debajo de 80. ?Etapa 1 de hipertensi?n ?Presi?n sist?lica: [phone]. ?Presi?n  diast?lica: 93-71. ?Etapa 2 de hipertensi?n ?Presi?n sist?lica: 696 o m?s. ?Presi?n diast?lica: 90 o m?s. ?Puede tener presi?n arterial elevada o hipertensi?n incluso si ?nicamente el n?mero sist?lico o el diast?lico de su lectura es m?s elevado de lo normal. ?Siga estas indicaciones en su casa: ?Medicamentos ?Use los medicamentos de venta libre y los recetados solamente como se lo haya indicado el m?dico. ?D?gale a su m?dico si tiene efectos secundarios causados por los medicamentos para la presi?n arterial. ?Indicaciones generales ?Mida su presi?n arterial con la frecuencia recomendada por su m?dico. ?Contr?lese la presi?n arterial a la misma hora todos los d?as. ?Lleve el tensi?metro a su pr?xima cita con el m?dico para asegurarse de lo siguiente: ?Que lo [place] correctamente. ?Que genera lecturas precisas. ?Aseg?rese de entender cu?les son sus n?meros objetivo para la presi?n arterial. ?Concurra a [place]. Esto es importante. ?Consejos generales ?Su m?dico puede sugerirle un tensi?metro confiable que cumpla con sus necesidades. Existen varios tipos de tensi?metros para Corporate investment banker. ?Escoja un tensi?metro que tenga un brazalete. No escoja un tensi?metro que mida su presi?n arterial en la mu?eca o el dedo. ?Elija un brazalete que le envuelva ce?idamente, pero no ajuste demasiado ni quede muy flojo, la parte superior del brazo. Debe poder meter ?nicamente un dedo entre el brazalete y Water engineer. ?Puede comprar un tensi?metro en la mayor?a de las farmacias o en l?nea. ?D?nde obtener m?s informaci?n ?American Heart Association (Asociaci?n [place]): www.heart.org ?Comun?quese con un m?dico si: ?Su presi?n arterial es sistem?ticamente alta. ?Su presi?n arterial disminuye repentinamente. ?Solicite ayuda de inmediato si: ?Su presi?n arterial sist?lica est? por encima de 180. ?Su presi?n arterial diast?lica est? por encima de 120. ?Estos s?ntomas pueden Sales executive.  Solicite ayuda de inmediato. Llame al 911. ?No espere a ver si los s?ntomas desaparecen. ?No conduzca por sus propios medios Principal Financial. ?Resumen ?La presi?n arterial es la medida de la fuerza de la sangre al presionar contra las paredes de las arterias. ?Una lectura de la presi?n arterial consta de un n?mero m?s alto sobre un n?mero m?s bajo. En condiciones ideales, la presi?n arterial debe estar por debajo de 120/80. ?Contr?lese la presi?n arterial a la misma hora todos los d?as. ?Evite la cafe?na, el alcohol, fumar y Radiation protection practitioner f?sica durante los 30 minutos anteriores a controlarse la presi?n arterial. Estos factores pueden afectar la precisi?n de la lectura de la presi?n arterial. ?Esta informaci?n no tiene Marine scientist el consejo del m?dico. Aseg?rese de hacerle al m?dico cualquier pregunta que tenga. ?Document Revised: 11/26/2020 Document Reviewed: 11/26/2020 ?Elsevier Patient Education ? [place]. ? ?

## 2021-06-25 ENCOUNTER — Other Ambulatory Visit: Payer: Self-pay

## 2021-06-25 DIAGNOSIS — E1122 Type 2 diabetes mellitus with diabetic chronic kidney disease: Secondary | ICD-10-CM

## 2021-06-25 NOTE — Progress Notes (Signed)
Patient tolerated blood draw well today. 

## 2021-06-26 LAB — COMP. METABOLIC PANEL (12)
AST: 12 IU/L (ref 0–40)
Albumin/Globulin Ratio: 1.3 (ref 1.2–2.2)
Albumin: 4 g/dL (ref 3.8–4.9)
Alkaline Phosphatase: 99 IU/L (ref 44–121)
BUN/Creatinine Ratio: 4 — ABNORMAL LOW (ref 9–20)
BUN: 22 mg/dL (ref 6–24)
Bilirubin Total: 0.5 mg/dL (ref 0.0–1.2)
Calcium: 8.9 mg/dL (ref 8.7–10.2)
Chloride: 91 mmol/L — ABNORMAL LOW (ref 96–106)
Creatinine, Ser: 5.51 mg/dL — ABNORMAL HIGH (ref 0.76–1.27)
Globulin, Total: 3.1 g/dL (ref 1.5–4.5)
Glucose: 279 mg/dL — ABNORMAL HIGH (ref 70–99)
Potassium: 4.1 mmol/L (ref 3.5–5.2)
Sodium: 135 mmol/L (ref 134–144)
Total Protein: 7.1 g/dL (ref 6.0–8.5)
eGFR: 11 mL/min/{1.73_m2} — ABNORMAL LOW (ref >59)

## 2021-06-26 LAB — CBC WITH DIFFERENTIAL/PLATELET
Basophils Absolute: 0.1 10*3/uL (ref 0.0–0.2)
Basos: 1 %
EOS (ABSOLUTE): 0.2 10*3/uL (ref 0.0–0.4)
Eos: 2 %
Hematocrit: 31.5 % — ABNORMAL LOW (ref 37.5–51.0)
Hemoglobin: 10.6 g/dL — ABNORMAL LOW (ref 13.0–17.7)
Immature Grans (Abs): 0 10*3/uL (ref 0.0–0.1)
Immature Granulocytes: 0 %
Lymphocytes Absolute: 1.4 10*3/uL (ref 0.7–3.1)
Lymphs: 20 %
MCH: 30.1 pg (ref 26.6–33.0)
MCHC: 33.7 g/dL (ref 31.5–35.7)
MCV: 90 fL (ref 79–97)
Monocytes Absolute: 0.7 10*3/uL (ref 0.1–0.9)
Monocytes: 10 %
Neutrophils Absolute: 4.8 10*3/uL (ref 1.4–7.0)
Neutrophils: 67 %
Platelets: 314 10*3/uL (ref 150–450)
RBC: 3.52 x10E6/uL — ABNORMAL LOW (ref 4.14–5.80)
RDW: 12.8 % (ref 11.6–15.4)
WBC: 7.2 10*3/uL (ref 3.4–10.8)

## 2021-06-26 LAB — MICROALBUMIN / CREATININE URINE RATIO
Creatinine, Urine: 50.1 mg/dL
Microalb/Creat Ratio: 2507 mg/g creat — ABNORMAL HIGH (ref 0–29)
Microalbumin, Urine: 1256.1 ug/mL

## 2021-06-26 LAB — LIPID PANEL
Chol/HDL Ratio: 4.4 ratio (ref 0.0–5.0)
Cholesterol, Total: 196 mg/dL (ref 100–199)
HDL: 45 mg/dL (ref >39)
LDL Chol Calc (NIH): 118 mg/dL — ABNORMAL HIGH (ref 0–99)
Triglycerides: 186 mg/dL — ABNORMAL HIGH (ref 0–149)
VLDL Cholesterol Cal: 33 mg/dL (ref 5–40)

## 2021-06-26 LAB — TSH: TSH: 3.59 u[IU]/mL (ref 0.450–4.500)

## 2021-06-28 ENCOUNTER — Encounter: Payer: Self-pay | Admitting: *Deleted

## 2021-06-28 ENCOUNTER — Telehealth: Payer: Self-pay | Admitting: *Deleted

## 2021-06-28 NOTE — Telephone Encounter (Signed)
Medical Assistant used [employer] to contact patient.  ?Interpreter Name: Katherine Basset Interpreter #: [id number] ?MA UTR patient on any numbers listed on the patients file. ? ?

## 2021-06-28 NOTE — Telephone Encounter (Signed)
-----   Message from Kennieth Rad, [place] sent at 06/26/2021 11:03 AM EDT ----- ?Please call patient and let him know that his cholesterol overall is within normal limits, however his triglycerides and LDL continue to be elevated.  He is taking cholesterol medication, he needs to make sure that he is also following a low-cholesterol diet. ? ?His kidney function is stable, he is on dialysis.  His fasting blood glucose was elevated, he is just now restarting his diabetic medication ? ?His liver function is within normal limits.  He does show signs of anemia, however stable and likely anemia of chronic disease.  He needs to continue with his current regimen and make sure he keeps his follow-up appointment with his primary care provider ?

## 2021-07-22 ENCOUNTER — Other Ambulatory Visit: Payer: Self-pay

## 2021-07-23 ENCOUNTER — Other Ambulatory Visit: Payer: Self-pay

## 2021-07-23 ENCOUNTER — Ambulatory Visit (INDEPENDENT_AMBULATORY_CARE_PROVIDER_SITE_OTHER): Payer: Self-pay | Admitting: Family Medicine

## 2021-07-23 ENCOUNTER — Encounter: Payer: Self-pay | Admitting: Family Medicine

## 2021-07-23 VITALS — BP 148/76 | HR 77 | Temp 98.1°F | Resp 16 | Wt 173.4 lb

## 2021-07-23 DIAGNOSIS — E1122 Type 2 diabetes mellitus with diabetic chronic kidney disease: Secondary | ICD-10-CM

## 2021-07-23 DIAGNOSIS — Z789 Other specified health status: Secondary | ICD-10-CM

## 2021-07-23 DIAGNOSIS — I10 Essential (primary) hypertension: Secondary | ICD-10-CM

## 2021-07-23 DIAGNOSIS — Z992 Dependence on renal dialysis: Secondary | ICD-10-CM

## 2021-07-23 DIAGNOSIS — N186 End stage renal disease: Secondary | ICD-10-CM

## 2021-07-23 MED ORDER — AMLODIPINE BESYLATE 10 MG PO TABS
10.0000 mg | ORAL_TABLET | Freq: Every day | ORAL | 1 refills | Status: DC
  Filled 2021-07-23 – 2021-08-20 (×3): qty 30, 30d supply, fill #0
  Filled 2021-11-14: qty 30, 30d supply, fill #1

## 2021-07-23 MED ORDER — GLIPIZIDE 10 MG PO TABS
10.0000 mg | ORAL_TABLET | Freq: Two times a day (BID) | ORAL | 3 refills | Status: DC
  Filled 2021-07-23 – 2021-08-20 (×3): qty 60, 30d supply, fill #0

## 2021-07-23 MED ORDER — ATORVASTATIN CALCIUM 40 MG PO TABS
40.0000 mg | ORAL_TABLET | Freq: Every day | ORAL | 1 refills | Status: DC
  Filled 2021-07-23 – 2021-08-13 (×2): qty 30, 30d supply, fill #0
  Filled 2021-08-13 – 2021-08-20 (×2): qty 90, 90d supply, fill #0
  Filled 2021-11-14: qty 90, 90d supply, fill #1

## 2021-07-23 NOTE — Progress Notes (Unsigned)
Patient is here for medication refill fior DMll. Patient said that he feel much better.

## 2021-07-24 ENCOUNTER — Other Ambulatory Visit: Payer: Self-pay

## 2021-07-24 ENCOUNTER — Encounter: Payer: Self-pay | Admitting: Family Medicine

## 2021-07-24 NOTE — Progress Notes (Signed)
Established Patient Office Visit  Subjective    Patient ID: Max Sanchez, male    DOB: 04-17-1965  Age: 56 y.o. MRN: [id number]  CC:  Chief Complaint  Patient presents with   Diabetes   Follow-up    HPI [name] presents for routine follow up of chronic med issues including diabetes and hypertension. Patient denies acute complaints or concerns. This visit was aided by an interpreter.    Outpatient Encounter Medications as of 07/23/2021  Medication Sig   amLODipine (NORVASC) 10 MG tablet Take by mouth.   aspirin EC 81 MG tablet Take 81 mg by mouth daily.   AURYXIA 1 GM 210 MG(Fe) tablet Take 210 mg by mouth 3 (three) times daily.   B Complex-C-Folic Acid (RENA-VITE RX) 1 MG TABS Take 1 tablet by mouth daily.   glipiZIDE (GLUCOTROL) 10 MG tablet Take 1 tablet (10 mg total) by mouth 2 (two) times daily before a meal.   glipiZIDE (GLUCOTROL) 5 MG tablet TAKE 1 TABLET BY MOUTH DAILY WITH BREAKFAST.   hydrALAZINE (APRESOLINE) 50 MG tablet TAKE 1 TABLET BY MOUTH EVERY 8 HOURS   Multiple Vitamins-Minerals (MULTIVITAMIN WITH MINERALS) tablet Take 1 tablet by mouth daily.   multivitamin (RENA-VIT) TABS tablet Take 1 tablet by mouth daily.   [DISCONTINUED] amLODipine (NORVASC) 10 MG tablet Take 1 tablet (10 mg total) by mouth at bedtime.   [DISCONTINUED] atorvastatin (LIPITOR) 40 MG tablet TAKE 1 TABLET BY MOUTH DAILY.   amLODipine (NORVASC) 10 MG tablet Take 1 tablet (10 mg total) by mouth at bedtime.   atorvastatin (LIPITOR) 40 MG tablet TAKE 1 TABLET BY MOUTH DAILY.   No facility-administered encounter medications on file as of 07/23/2021.    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 ([place])    Stroke ([place]) 03/2018   Type 2 diabetes mellitus with complication, without long-term current use of insulin [employer])     Past Surgical History:  Procedure Laterality Date   A/V FISTULAGRAM Left 02/07/2019   Procedure: A/V FISTULAGRAM;  Surgeon: Waynetta Sandy, MD;  Location: [place] [facility];  Service: Cardiovascular;  Laterality: Left;   AV FISTULA PLACEMENT Left 11/22/2018   Procedure: ARTERIOVENOUS (AV) FISTULA CREATION LEFT ARM;  Surgeon: Rosetta Posner, MD;  Location: [place];  Service: Vascular;  Laterality: Left;   [place] Left 02/16/2019   Procedure: First and Second Stage Bascilic Vein Transposition Left Arm;  Surgeon: Waynetta Sandy, MD;  Location: [place];  Service: Vascular;  Laterality: Left;   EYE SURGERY Bilateral    FOOT SURGERY     INSERTION OF DIALYSIS CATHETER Right 11/22/2018   Procedure: INSERTION OF TUNNELED  DIALYSIS CATHETER, right internal jugular;  Surgeon: Rosetta Posner, MD;  Location: [place];  Service: Vascular;  Laterality: Right;   IR DIALY SHUNT INTRO NEEDLE/INTRACATH INITIAL W/IMG LEFT Left 04/30/2021   IR FLUORO GUIDE CV LINE RIGHT  11/16/2018   IR REMOVAL TUN CV CATH W/O FL  04/07/2019   IR US GUIDE VASC ACCESS LEFT  05/07/2021   IR US GUIDE VASC ACCESS RIGHT  11/16/2018   PR RPR COMPLEX RETINA DETACH VITRECT &MEMBRANE PEEL Left  10/01/2016   PR RPR COMPLEX RETINA DETACH VITRECT &MEMBRANE PEEL Right 12/03/2016   PR RPR COMPLEX RETINA DETACH VITRECT &MEMBRANE PEEL Right 04/01/2017   PR XCAPSL CTRC RMVL INSJ IO LENS PROSTH W/O ECP Right 12/03/2016    Family History  Problem Relation Age of Onset   Diabetes Father    Diabetes Brother    Kidney disease Brother     Social History   Socioeconomic History   Marital status: Married    Spouse name: Not on file   Number of children: Not on file   Years of education: Not on file   Highest education level: Not on file   Occupational History   Not on file  Tobacco Use   Smoking status: Former    Years: 7.00    Types: Cigarettes    Quit date: 2017    Years since quitting: 6.3   Smokeless tobacco: Never   Tobacco comments:    10 pack year history; quit 2 years ago  Vaping Use   Vaping Use: Never used  Substance and Sexual Activity   Alcohol use: Not Currently    Comment: stopped 2 years ago   Drug use: Not Currently    Comment: tried cocaine and marijuana when younger   Sexual activity: Not on file  Other Topics Concern   Not on file  Social History Narrative   Not on file   Social Determinants of Health   Financial Resource Strain: Not on file  Food Insecurity: Not on file  Transportation Needs: Not on file  Physical Activity: Not on file  Stress: Not on file  Social Connections: Not on file  Intimate Partner Violence: Not on file    Review of Systems  All other systems reviewed and are negative.      Objective    BP (!) 148/76   Pulse 77   Temp 98.1 F (36.7 C) (Oral)   Resp 16   Wt 173 lb 6.4 oz (78.7 kg)   SpO2 96%   BMI 27.99 kg/m   Physical Exam Vitals and nursing note reviewed.  Constitutional:      General: He is not in acute distress. Cardiovascular:     Rate and Rhythm: Normal rate and regular rhythm.  Pulmonary:     Effort: Pulmonary effort is normal.     Breath sounds: Normal breath sounds.  Abdominal:     Palpations: Abdomen is soft.     Tenderness: There is no abdominal tenderness.  Skin:    Comments: Dialysis fistula noted in right upper arm  Neurological:     General: No focal deficit present.     Mental Status: He is alert and oriented to person, place, and time.        Assessment & Plan:   1. Type 2 diabetes mellitus with chronic kidney disease on chronic dialysis, without long-term current use of insulin (HCC) Greatly increased A1c. Will refer to [facility] for med management including probable start of insulin. Glipizide was increased from 5  to 10 mg bid - atorvastatin (LIPITOR) 40 MG tablet; TAKE 1 TABLET BY MOUTH DAILY.  Dispense: 90 tablet; Refill:   2. Primary hypertension Appears stable with present management. Meds refilled. Continue and monitor - amLODipine (NORVASC) 10 MG tablet; Take 1 tablet (10 mg total) by mouth at bedtime.  Dispense: 30 tablet; Refill: 1  3. Language barrier to communication     Return in about 3 months (around 10/23/2021) for follow up.   Redmond Pulling,  Patricia Pesa, MD

## 2021-07-30 ENCOUNTER — Other Ambulatory Visit: Payer: Self-pay

## 2021-08-13 ENCOUNTER — Other Ambulatory Visit: Payer: Self-pay

## 2021-08-19 ENCOUNTER — Other Ambulatory Visit: Payer: Self-pay

## 2021-08-20 ENCOUNTER — Other Ambulatory Visit: Payer: Self-pay

## 2021-08-27 ENCOUNTER — Ambulatory Visit: Payer: Self-pay | Attending: Family Medicine | Admitting: Pharmacist

## 2021-08-27 ENCOUNTER — Other Ambulatory Visit: Payer: Self-pay

## 2021-08-27 ENCOUNTER — Encounter: Payer: Self-pay | Admitting: Pharmacist

## 2021-08-27 DIAGNOSIS — Z992 Dependence on renal dialysis: Secondary | ICD-10-CM

## 2021-08-27 DIAGNOSIS — E1122 Type 2 diabetes mellitus with diabetic chronic kidney disease: Secondary | ICD-10-CM

## 2021-08-27 DIAGNOSIS — N186 End stage renal disease: Secondary | ICD-10-CM

## 2021-08-27 MED ORDER — TRUEPLUS LANCETS 28G MISC
2 refills | Status: AC
  Filled 2021-08-27 – 2021-11-14 (×2): qty 100, 50d supply, fill #0
  Filled 2022-07-01: qty 100, 50d supply, fill #1

## 2021-08-27 MED ORDER — TRUE METRIX BLOOD GLUCOSE TEST VI STRP
ORAL_STRIP | 2 refills | Status: AC
  Filled 2021-08-27 – 2021-10-08 (×2): qty 100, 50d supply, fill #0
  Filled 2021-11-14: qty 100, 50d supply, fill #1
  Filled 2022-07-01: qty 100, 50d supply, fill #2

## 2021-08-27 MED ORDER — TRUE METRIX METER W/DEVICE KIT
PACK | 0 refills | Status: AC
  Filled 2021-08-27: qty 1, 30d supply, fill #0

## 2021-09-04 ENCOUNTER — Other Ambulatory Visit: Payer: Self-pay

## 2021-10-08 ENCOUNTER — Other Ambulatory Visit: Payer: Self-pay

## 2021-10-08 ENCOUNTER — Ambulatory Visit: Payer: Self-pay | Attending: Internal Medicine | Admitting: Pharmacist

## 2021-10-08 DIAGNOSIS — Z992 Dependence on renal dialysis: Secondary | ICD-10-CM

## 2021-10-08 DIAGNOSIS — N186 End stage renal disease: Secondary | ICD-10-CM

## 2021-10-08 DIAGNOSIS — E1122 Type 2 diabetes mellitus with diabetic chronic kidney disease: Secondary | ICD-10-CM

## 2021-10-08 MED ORDER — BASAGLAR KWIKPEN 100 UNIT/ML ~~LOC~~ SOPN
10.0000 [IU] | PEN_INJECTOR | Freq: Every day | SUBCUTANEOUS | 2 refills | Status: DC
  Filled 2021-10-08: qty 3, 30d supply, fill #0
  Filled 2021-11-14: qty 3, 30d supply, fill #1
  Filled 2021-12-18: qty 3, 30d supply, fill #2

## 2021-10-08 NOTE — Progress Notes (Signed)
    S:     No chief complaint on file.  Max Sanchez is a 56 y.o. male who presents for diabetes evaluation, education, and management.  PMH is significant for T2DM with diabetic retinopathy, complicated by ESRD on MWF HD, HTN, hx of CVA, anemia of chronic disease. Patient was referred and last seen by Primary Care Provider, Dr. Redmond Pulling, on 07/23/2021. I saw him on 08/27/2021 and wrote him for GM supplies. No changes were made d/t having no home data.   Today, patient arrives in good spirits and presents without any assistance. Of note, he reduced his glipizide to 10 mg daily d/t symptoms of hypoglycemia. Unfortunately, he does not check his sugar when this occurs. This is occurring almost daily.   Family/Social History:  Fhx: DM, CKD Tobacco: former smoker (quit in 2017) Alcohol: none reported   Current diabetes medications include: glipizide 10 mg BID (only taking 10 mg daily)  Insurance coverage: none  Patient reports hypoglycemic events that occur daily. Endorses symptoms such as shaking and feeling hungry.  Reported home fasting blood sugars: only 3 values on his meter since last visit. 125 - 143.   Patient denies nocturia (nighttime urination).  Patient denies neuropathy (nerve pain). Patient denies visual changes. Patient reports self foot exams.   Patient reported dietary habits:  - Tries to follow a diabetic diet. - Tries to eat mainly fresh fruits and vegetables. Avoids bananas, pineapple, and citrus fruits.  - Drinks mainly water. Avoids sugar-sweetened beverages  - Does not eat at regular intervals and will sometimes only eat 1 meal per day.   Patient-reported exercise habits: none   O:    Lab Results  Component Value Date   HGBA1C 12.3 (A) 06/20/2021   There were no vitals filed for this visit.  Lipid Panel     Component Value Date/Time   CHOL 196 06/25/2021 0902   TRIG 186 (H) 06/25/2021 0902   HDL 45 06/25/2021 0902   CHOLHDL 4.4 06/25/2021 0902    CHOLHDL 6.5 11/16/2018 0147   VLDL 31 11/16/2018 0147   LDLCALC 118 (H) 06/25/2021 0902    Clinical Atherosclerotic Cardiovascular Disease (ASCVD): Yes  The ASCVD Risk score (Arnett DK, et al., 2019) failed to calculate for the following reasons:   The patient has a prior MI or stroke diagnosis   A/P: Diabetes longstanding currently above goal. Patient is able to verbalize appropriate hypoglycemia management plan. He self-decreased his glipizide d/t hypoglycemia. Question the reliability of his A1c given his ESRD. Glipizide is the pref SU but he continues to have hypoglycemia with the 10 mg daily dose. He is high risk hypo d/t ESRD. Given the option to further decrease glipizide or start a very low daily dose of basal insulin. Will d/c glipizide and start Lantus instead. -Discontinued glipizide 10 mg BID for now.  -Start Basaglar 10u daily.  -Extensively discussed pathophysiology of diabetes, recommended lifestyle interventions, dietary effects on blood sugar control.  -Counseled on s/sx of and management of hypoglycemia.  -Next A1c anticipated at next visit.   Written patient instructions provided. Patient verbalized understanding of treatment plan. Total time in face to face counseling 30 minutes.    Follow-up:  Pharmacist in 1 month  Benard Halsted, PharmD, [place], [place] [phone]

## 2021-10-09 ENCOUNTER — Other Ambulatory Visit: Payer: Self-pay | Admitting: Pharmacist

## 2021-10-09 ENCOUNTER — Other Ambulatory Visit: Payer: Self-pay

## 2021-10-09 MED ORDER — INSULIN PEN NEEDLE 32G X 4 MM MISC
2 refills | Status: AC
  Filled 2021-10-09: qty 100, 90d supply, fill #0
  Filled 2022-01-20: qty 100, 90d supply, fill #1
  Filled 2022-05-19 – 2022-08-07 (×3): qty 100, 90d supply, fill #2

## 2021-10-10 ENCOUNTER — Other Ambulatory Visit: Payer: Self-pay

## 2021-10-24 ENCOUNTER — Other Ambulatory Visit: Payer: Self-pay

## 2021-10-24 ENCOUNTER — Ambulatory Visit (INDEPENDENT_AMBULATORY_CARE_PROVIDER_SITE_OTHER): Payer: Self-pay | Admitting: Family Medicine

## 2021-10-24 ENCOUNTER — Encounter: Payer: Self-pay | Admitting: Family Medicine

## 2021-10-24 VITALS — BP 177/90 | HR 72 | Temp 97.6°F | Resp 16 | Ht 66.0 in | Wt 165.0 lb

## 2021-10-24 DIAGNOSIS — I10 Essential (primary) hypertension: Secondary | ICD-10-CM

## 2021-10-24 DIAGNOSIS — Z789 Other specified health status: Secondary | ICD-10-CM

## 2021-10-24 DIAGNOSIS — Z992 Dependence on renal dialysis: Secondary | ICD-10-CM

## 2021-10-24 DIAGNOSIS — E1122 Type 2 diabetes mellitus with diabetic chronic kidney disease: Secondary | ICD-10-CM

## 2021-10-24 DIAGNOSIS — N186 End stage renal disease: Secondary | ICD-10-CM

## 2021-10-24 MED ORDER — LISINOPRIL 10 MG PO TABS
10.0000 mg | ORAL_TABLET | Freq: Every day | ORAL | 0 refills | Status: DC
  Filled 2021-10-24: qty 90, 90d supply, fill #0

## 2021-10-25 ENCOUNTER — Encounter: Payer: Self-pay | Admitting: Family Medicine

## 2021-10-25 NOTE — Progress Notes (Signed)
Established Patient Office Visit  Subjective    Patient ID: Max Sanchez, male    DOB: October 03, 1965  Age: 56 y.o. MRN: [id number]  CC:  Chief Complaint  Patient presents with   Follow-up    HPI Max Sanchez presents for routine follow up of chronic med issues including diabetes. Patient denies acute complaints or concerns. This visit was aided by an interpreter.    Outpatient Encounter Medications as of 10/24/2021  Medication Sig   acetaminophen (OFIRMEV) 10 MG/ML SOLN Take by mouth.   Doxercalciferol (HECTOROL IV) Doxercalciferol (Hectorol)   ferric citrate (AURYXIA) 1 GM 210 MG(Fe) tablet Take by mouth.   iron sucrose in sodium chloride 0.9 % 100 mL Iron Sucrose (Venofer)   iron sucrose in sodium chloride 0.9 % 100 mL Iron Sucrose (Venofer)   lisinopril (ZESTRIL) 10 MG tablet Take 1 tablet (10 mg total) by mouth daily.   LOPERAMIDE HCL PO Take by mouth.   Methoxy PEG-Epoetin Beta (MIRCERA IJ) Mircera   Methoxy PEG-Epoetin Beta (MIRCERA IJ) Mircera   Methoxy PEG-Epoetin Beta (MIRCERA IJ) Mircera   Methoxy PEG-Epoetin Beta (MIRCERA IJ) Mircera   Methoxy PEG-Epoetin Beta (MIRCERA IJ) Mircera   nitroGLYCERIN (NITROSTAT) 0.4 MG SL tablet Place under the tongue.   amLODipine (NORVASC) 10 MG tablet Take 1 tablet (10 mg total) by mouth at bedtime.   aspirin EC 81 MG tablet Take 81 mg by mouth daily.   atorvastatin (LIPITOR) 40 MG tablet TAKE 1 TABLET BY MOUTH DAILY.   B Complex-C-Folic Acid (RENA-VITE RX) 1 MG TABS Take 1 tablet by mouth daily.   Blood Glucose Monitoring Suppl (TRUE METRIX METER) w/Device KIT Use to check blood sugars twice daily.   glucose blood (TRUE METRIX BLOOD GLUCOSE TEST) test strip Use to check blood sugars twice daily.   hydrALAZINE (APRESOLINE) 50 MG tablet TAKE 1 TABLET BY MOUTH EVERY 8 HOURS   Insulin Glargine (BASAGLAR KWIKPEN) 100 UNIT/ML Inject 10 Units into the skin daily.   Insulin Pen Needle 32G X 4 MM MISC Use to inject insulin once daily.    Multiple Vitamins-Minerals (MULTIVITAMIN WITH MINERALS) tablet Take 1 tablet by mouth daily.   multivitamin (RENA-VIT) TABS tablet Take 1 tablet by mouth daily.   TRUEplus Lancets 28G MISC Use to check blood sugars twice daily.   [DISCONTINUED] amLODipine (NORVASC) 10 MG tablet Take by mouth.   [DISCONTINUED] AURYXIA 1 GM 210 MG(Fe) tablet Take 210 mg by mouth 3 (three) times daily.   No facility-administered encounter medications on file as of 10/24/2021.    Past Medical History:  Diagnosis Date   Diabetic neuropathy ([place])    ESRD (end stage renal disease) ([place]) 03/10/2018   Dialysis TTHSat   History of CVA (cerebrovascular accident) 03/10/2018   Hyperlipidemia 03/19/2018   Hypertension 03/10/2018   Hypertensive retinopathy of left eye    Proliferative diabetic retinopathy of both 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 ([place])    Stroke ([place]) 03/2018   Type 2 diabetes mellitus with complication, without long-term current use of insulin [facility]     Past Surgical History:  Procedure Laterality Date   A/V FISTULAGRAM Left 02/07/2019   Procedure: A/V FISTULAGRAM;  Surgeon: Waynetta Sandy, MD;  Location: [place] [facility];  Service: Cardiovascular;  Laterality: Left;   AV FISTULA PLACEMENT Left 11/22/2018   Procedure: ARTERIOVENOUS (AV) FISTULA CREATION LEFT ARM;  Surgeon: Rosetta Posner, MD;  Location: [place];  Service: Vascular;  Laterality: Left;   [place]  Left 02/16/2019   Procedure: First and Second Stage Bascilic Vein Transposition Left Arm;  Surgeon: Waynetta Sandy, MD;  Location: [facility];  Service: Vascular;  Laterality: Left;   EYE SURGERY Bilateral    FOOT SURGERY     INSERTION OF DIALYSIS CATHETER Right 11/22/2018   Procedure: INSERTION OF TUNNELED  DIALYSIS CATHETER, right internal jugular;  Surgeon: Rosetta Posner, MD;  Location: [facility];  Service: Vascular;  Laterality: Right;   IR DIALY SHUNT INTRO NEEDLE/INTRACATH INITIAL W/IMG LEFT Left  04/30/2021   IR FLUORO GUIDE CV LINE RIGHT  11/16/2018   IR REMOVAL TUN CV CATH W/O FL  04/07/2019   IR US GUIDE VASC ACCESS LEFT  05/07/2021   IR US GUIDE VASC ACCESS RIGHT  11/16/2018   PR RPR COMPLEX RETINA DETACH VITRECT &MEMBRANE PEEL Left 10/01/2016   PR RPR COMPLEX RETINA DETACH VITRECT &MEMBRANE PEEL Right 12/03/2016   PR RPR COMPLEX RETINA DETACH VITRECT &MEMBRANE PEEL Right 04/01/2017   PR XCAPSL CTRC RMVL INSJ IO LENS PROSTH W/O ECP Right 12/03/2016    Family History  Problem Relation Age of Onset   Diabetes Father    Diabetes Brother    Kidney disease Brother     Social History   Socioeconomic History   Marital status: Married    Spouse name: Not on file   Number of children: Not on file   Years of education: Not on file   Highest education level: Not on file  Occupational History   Not on file  Tobacco Use   Smoking status: Former    Years: 7.00    Types: Cigarettes    Quit date: 2017    Years since quitting: 6.6   Smokeless tobacco: Never   Tobacco comments:    10 pack year history; quit 2 years ago  Vaping Use   Vaping Use: Never used  Substance and Sexual Activity   Alcohol use: Not Currently    Comment: stopped 2 years ago   Drug use: Not Currently    Comment: tried cocaine and marijuana when younger   Sexual activity: Not on file  Other Topics Concern   Not on file  Social History Narrative   Not on file   Social Determinants of Health   Financial Resource Strain: Not on file  Food Insecurity: Not on file  Transportation Needs: Not on file  Physical Activity: Not on file  Stress: Not on file  Social Connections: Not on file  Intimate Partner Violence: Not on file    Review of Systems  All other systems reviewed and are negative.       Objective    BP (!) 177/90   Pulse 72   Temp 97.6 F (36.4 C) (Oral)   Resp 16   Ht _0  (1.676 m)   Wt 165 lb (74.8 kg)   SpO2 97%   BMI 26.63 kg/m   Physical Exam Vitals and nursing note  reviewed.  Constitutional:      General: He is not in acute distress. Cardiovascular:     Rate and Rhythm: Normal rate and regular rhythm.  Pulmonary:     Effort: Pulmonary effort is normal.     Breath sounds: Normal breath sounds.  Abdominal:     Palpations: Abdomen is soft.     Tenderness: There is no abdominal tenderness.  Skin:    Comments: Dialysis fistula noted in right upper arm  Neurological:     General: No focal deficit present.  Mental Status: He is alert and oriented to person, place, and time.         Assessment & Plan:   1. Type 2 diabetes mellitus with chronic kidney disease on chronic dialysis, without long-term current use of insulin (HCC) Continue present management - Hemoglobin A1c  2. ESRD (end stage renal disease) ([place]) Continue dialysis and management per consultant  3. Uncontrolled hypertension Lisinopril 10 mg added to regimen. Discussed patient should take meds after dialysis and not immediately before.   4. Language barrier to communication   Return in about 3 months (around 01/24/2022) for follow up blood pressure and diabetes.   Becky Sax, MD

## 2021-10-29 ENCOUNTER — Telehealth: Payer: Self-pay | Admitting: Family Medicine

## 2021-10-29 LAB — SPECIMEN STATUS REPORT

## 2021-10-29 LAB — HEMOGLOBIN A1C
Est. average glucose Bld gHb Est-mCnc: 123 mg/dL
Hgb A1c MFr Bld: 5.9 % — ABNORMAL HIGH (ref 4.8–5.6)

## 2021-10-29 NOTE — Telephone Encounter (Signed)
Kristin from [facility] called in , states received gel serum order and wants to know should they add a test. Please call back

## 2021-10-30 ENCOUNTER — Other Ambulatory Visit: Payer: Self-pay

## 2021-11-13 NOTE — Progress Notes (Signed)
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 PMH is significant for T2DM with diabetic retinopathy, complicated by ESRD on MWF HD, HTN, hx of CVA, anemia of chronic disease. Patient was referred and last seen by Primary Care Provider, Dr. Redmond Pulling, on 07/23/2021.   At last visit with the clinical pharmacist, patient reported hypoglycemia with his glipizide and had self-decreased his dose. Given his ESRD status, glipizide was discontinued and Basaglar was initiated. Patient reported he has not been taking his blood sugar levels at home.   At last visit with PCP on 8/24, A1c of 5.9, however, given his ESRD status, likely unreliable.   Today, patient arrives in good spirits and presents without any assistance. Visit was conducted with a Optometrist. At this visit, there was a good bit of confusion with his regimen. He had not taken his BP medications in about 4 days. He was not aware of a hydralazine prescription.He stated he only takes his BP medications on days he does not have dialysis. He endorsed missing about 3 doses of his insulin over the last month. He does not like the way he feels with low blood sugar.   Family/Social History:  Fhx: DM, CKD Tobacco: former smoker (quit in 2017) Alcohol: none reported   Current diabetes medications include: Basaglar 10 units daily  Current hypertension medications include: hydralazine 50mg  TID, amlodipine 10mg  once daily, lisinopril 10mg  once daily Current hyperlipidemia medications include: atorvastatin 40mg  once daily  Insurance coverage: none Patient denies hypoglycemic events.  Reported home blood sugars normally 115-125 in the afternoon, although could not give a time frame in relation to food. The highest BG he has seen is 240 and low of 99. He stated he has been experiencing issues with his glucometer and receiving error messages.   He endorsed drinking 1-2 sodas a  day. He stated he drinks more in the summer because of the heat. Encouraged to cut back to a maximum of one soda per day.   O:   Vitals:   11/14/21 1453  BP: (!) 172/90    Lab Results  Component Value Date   HGBA1C 5.9 (H) 10/24/2021   There were no vitals filed for this visit.  Lipid Panel     Component Value Date/Time   CHOL 196 06/25/2021 0902   TRIG 186 (H) 06/25/2021 0902   HDL 45 06/25/2021 0902   CHOLHDL 4.4 06/25/2021 0902   CHOLHDL 6.5 11/16/2018 0147   VLDL 31 11/16/2018 0147   LDLCALC 118 (H) 06/25/2021 0902   Clinical Atherosclerotic Cardiovascular Disease (ASCVD): Yes  The ASCVD Risk score (Arnett DK, et al., 2019) failed to calculate for the following reasons:   The patient has a prior MI or stroke diagnosis   A/P: Diabetes longstanding currently uncontrolled. Patient is able to verbalize appropriate hypoglycemia management plan. Medication adherence appears suboptimal. Control is suboptimal due to non-adherence and dietary indiscretions. -Continued basal insulin Basaglar 10 units once daily  -Patient encouraged to check blood sugar daily and bring a log to next visit.  -Patient educated on purpose, proper use, and potential adverse effects of insulin.  -Extensively discussed pathophysiology of diabetes, recommended lifestyle interventions, dietary effects on blood sugar control.  -Counseled on s/sx of and management of hypoglycemia.  -Next A1c anticipated November/December.   ASCVD risk - secondary prevention in patient with diabetes. Last LDL is not at goal of <70 mg/dL. High intensity statin  indicated.  -Continued atorvastatin 40 mg.   Hypertension longstanding currently uncontrolled. Blood pressure goal of <130/80 mmHg. Medication adherence suboptimal. Blood pressure control is suboptimal due to non-adherence. Lisinopril not effective for renal protection in ESRD and not super effective for blood pressure management.  -Emphasized the importance of taking  his blood pressure medications daily, regardless of dialysis.  -Discontinued lisinopril -Continued amlodipine 10mg  once daily -Continued hydralazine 50mg  TID  Written patient instructions provided. Patient verbalized understanding of treatment plan.  Total time in face to face counseling 40 minutes.    Follow-up:  Pharmacist in one month. PCP clinic visit in November.   Maryan Puls, PharmD PGY-1 [facility] Resident

## 2021-11-14 ENCOUNTER — Ambulatory Visit: Payer: Self-pay | Attending: Family Medicine | Admitting: Pharmacist

## 2021-11-14 ENCOUNTER — Other Ambulatory Visit: Payer: Self-pay

## 2021-11-14 VITALS — BP 172/90

## 2021-11-14 DIAGNOSIS — Z992 Dependence on renal dialysis: Secondary | ICD-10-CM

## 2021-11-14 DIAGNOSIS — N186 End stage renal disease: Secondary | ICD-10-CM

## 2021-11-14 DIAGNOSIS — E1122 Type 2 diabetes mellitus with diabetic chronic kidney disease: Secondary | ICD-10-CM

## 2021-11-14 DIAGNOSIS — I160 Hypertensive urgency: Secondary | ICD-10-CM

## 2021-12-17 ENCOUNTER — Ambulatory Visit: Payer: Self-pay | Admitting: Pharmacist

## 2021-12-18 ENCOUNTER — Other Ambulatory Visit: Payer: Self-pay

## 2021-12-19 ENCOUNTER — Other Ambulatory Visit: Payer: Self-pay

## 2022-01-20 ENCOUNTER — Other Ambulatory Visit: Payer: Self-pay | Admitting: Family Medicine

## 2022-01-20 ENCOUNTER — Other Ambulatory Visit: Payer: Self-pay

## 2022-01-20 DIAGNOSIS — E1122 Type 2 diabetes mellitus with diabetic chronic kidney disease: Secondary | ICD-10-CM

## 2022-01-20 DIAGNOSIS — I10 Essential (primary) hypertension: Secondary | ICD-10-CM

## 2022-01-20 MED ORDER — AMLODIPINE BESYLATE 10 MG PO TABS
10.0000 mg | ORAL_TABLET | Freq: Every day | ORAL | 1 refills | Status: DC
  Filled 2022-01-20: qty 30, 30d supply, fill #0
  Filled 2022-03-14 – 2022-03-24 (×2): qty 30, 30d supply, fill #1

## 2022-01-20 MED ORDER — BASAGLAR KWIKPEN 100 UNIT/ML ~~LOC~~ SOPN
10.0000 [IU] | PEN_INJECTOR | Freq: Every day | SUBCUTANEOUS | 0 refills | Status: DC
  Filled 2022-01-20: qty 3, 30d supply, fill #0

## 2022-01-20 MED ORDER — ATORVASTATIN CALCIUM 40 MG PO TABS
40.0000 mg | ORAL_TABLET | Freq: Every day | ORAL | 1 refills | Status: DC
  Filled 2022-01-20: qty 30, 30d supply, fill #0
  Filled 2022-03-14 – 2022-03-24 (×2): qty 30, 30d supply, fill #1
  Filled 2022-05-19 (×2): qty 30, 30d supply, fill #2

## 2022-01-22 ENCOUNTER — Other Ambulatory Visit: Payer: Self-pay

## 2022-01-30 ENCOUNTER — Ambulatory Visit: Payer: Self-pay | Admitting: Family Medicine

## 2022-03-14 ENCOUNTER — Other Ambulatory Visit: Payer: Self-pay | Admitting: Family Medicine

## 2022-03-14 ENCOUNTER — Other Ambulatory Visit: Payer: Self-pay

## 2022-03-14 DIAGNOSIS — N186 End stage renal disease: Secondary | ICD-10-CM

## 2022-03-14 MED ORDER — BASAGLAR KWIKPEN 100 UNIT/ML ~~LOC~~ SOPN
10.0000 [IU] | PEN_INJECTOR | Freq: Every day | SUBCUTANEOUS | 0 refills | Status: DC
  Filled 2022-03-14 – 2022-03-24 (×2): qty 3, 30d supply, fill #0

## 2022-03-14 NOTE — Telephone Encounter (Signed)
Diabetic medication refilled. Call placed to aware patient that not more refilled can be authorized until patient has office visit.

## 2022-03-20 ENCOUNTER — Other Ambulatory Visit: Payer: Self-pay

## 2022-03-21 ENCOUNTER — Other Ambulatory Visit: Payer: Self-pay

## 2022-03-24 ENCOUNTER — Other Ambulatory Visit: Payer: Self-pay

## 2022-05-19 ENCOUNTER — Other Ambulatory Visit: Payer: Self-pay

## 2022-05-19 ENCOUNTER — Other Ambulatory Visit: Payer: Self-pay | Admitting: Family Medicine

## 2022-05-19 DIAGNOSIS — I10 Essential (primary) hypertension: Secondary | ICD-10-CM

## 2022-05-19 DIAGNOSIS — E1122 Type 2 diabetes mellitus with diabetic chronic kidney disease: Secondary | ICD-10-CM

## 2022-05-26 ENCOUNTER — Other Ambulatory Visit: Payer: Self-pay

## 2022-05-30 ENCOUNTER — Other Ambulatory Visit: Payer: Self-pay

## 2022-05-30 ENCOUNTER — Other Ambulatory Visit (HOSPITAL_COMMUNITY): Payer: Self-pay

## 2022-06-02 NOTE — Progress Notes (Unsigned)
Patient ID: Max Sanchez, male    DOB: 1965/06/08  MRN: [id number]  CC: Medication Refills   Subjective: Max Sanchez is a 57 y.o. male who presents for medication refills.   His concerns today include:  Confirm with patient which meds need refills Last appt 10/24/21 Andrey Campanile  ESRD - dialysis   Patient Active Problem List   Diagnosis Date Noted   Other disorders of calcium metabolism 01/04/2021   Other disorders of phosphorus metabolism 01/04/2021   Allergy, unspecified, initial encounter 07/12/2020   Anaphylactic shock, unspecified, initial encounter 07/12/2020   Dyspnea, unspecified 07/12/2020   Diarrhea, unspecified 01/19/2020   Mild protein-calorie malnutrition 05/10/2019   Personal history of anaphylaxis 05/09/2019   Pruritus, unspecified 05/09/2019   Vitamin D deficiency, unspecified 05/09/2019   Pain, unspecified 03/02/2019   Unspecified protein-calorie malnutrition 12/23/2018   Acute on chronic systolic (congestive) heart failure 11/26/2018   Coagulation defect, unspecified 11/26/2018   Complication of vascular dialysis catheter 11/26/2018   Dependence on renal dialysis 11/26/2018   Encounter for immunization 11/26/2018   Encounter for screening for respiratory tuberculosis 11/26/2018   Iron deficiency anemia, unspecified 11/26/2018   Secondary hyperparathyroidism of renal origin 11/26/2018   Shortness of breath 11/26/2018   Type II diabetes mellitus with renal manifestations 11/16/2018   ESRD (end stage renal disease) 11/16/2018   Hypertensive urgency 11/16/2018   Fluid overload 11/16/2018   Acute respiratory failure with hypoxia 11/16/2018   Acute on chronic diastolic (congestive) heart failure 11/16/2018   Elevated troponin 11/16/2018   Anemia in CKD (chronic kidney disease) 11/16/2018   Acute renal failure superimposed on stage 4 chronic kidney disease    Stroke 04/22/2018   Hyperkalemia 04/22/2018   Hyperlipidemia 03/19/2018   Insomnia 03/19/2018    Hypertension 03/10/2018   CKD (chronic kidney disease), stage III 03/10/2018   Acute-on-chronic kidney injury 03/10/2018   Proteinuria 03/10/2018   Prolonged Q-T interval on ECG 03/10/2018   History of CVA (cerebrovascular accident) 03/10/2018   Retinal detachment, tractional, right eye 12/04/2016   Retinal detachment, tractional, left 10/02/2016   Hypertensive retinopathy of left eye 06/16/2016   Proliferative diabetic retinopathy of both 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 30 tablet 1   aspirin EC 81 MG tablet Take 81 mg by mouth daily.     atorvastatin (LIPITOR) 40 MG tablet TAKE 1 TABLET BY MOUTH DAILY. 90 tablet 1   Blood Glucose Monitoring Suppl (TRUE METRIX METER) w/Device KIT Use to check blood sugars twice daily. 1 kit 0   glucose blood (TRUE METRIX BLOOD GLUCOSE TEST) test strip Use to check blood sugars twice daily. 100 each 2   hydrALAZINE (APRESOLINE) 50 MG tablet TAKE 1 TABLET BY MOUTH EVERY 8 HOURS 270 tablet 2   Insulin Glargine (BASAGLAR KWIKPEN) 100 UNIT/ML Inject 10 Units into the skin daily. 3 mL 0   Insulin Pen Needle 32G X 4 MM MISC Use to inject insulin once daily. 100 each 2   nitroGLYCERIN (NITROSTAT) 0.4 MG SL tablet Place under the tongue.     TRUEplus Lancets 28G MISC Use to check blood sugars twice daily. 100 each 2   No current facility-administered medications on file prior to visit.    No Known Allergies  Social History   Socioeconomic History   Marital  status: Married    Spouse name: Not on file   Number of children: Not on file   Years of education: Not on file   Highest education level: Not on file  Occupational History   Not on file  Tobacco Use   Smoking status: Former    Years: 7    Types: Cigarettes    Quit date: 2017    Years since  quitting: 7.2   Smokeless tobacco: Never   Tobacco comments:    10 pack year history; quit 2 years ago  Vaping Use   Vaping Use: Never used  Substance and Sexual Activity   Alcohol use: Not Currently    Comment: stopped 2 years ago   Drug use: Not Currently    Comment: tried cocaine and marijuana when younger   Sexual activity: Not on file  Other Topics Concern   Not on file  Social History Narrative   Not on file   Social Determinants of Health   Financial Resource Strain: Not on file  Food Insecurity: Not on file  Transportation Needs: Not on file  Physical Activity: Not on file  Stress: Not on file  Social Connections: Not on file  Intimate Partner Violence: Not on file    Family History  Problem Relation Age of Onset   Diabetes Father    Diabetes Brother    Kidney disease Brother     Past Surgical History:  Procedure Laterality Date   A/V FISTULAGRAM Left 02/07/2019   Procedure: A/V FISTULAGRAM;  Surgeon: Maeola Harman, MD;  Location: [facility] INVASIVE CV LAB;  Service: Cardiovascular;  Laterality: Left;   AV FISTULA PLACEMENT Left 11/22/2018   Procedure: ARTERIOVENOUS (AV) FISTULA CREATION LEFT ARM;  Surgeon: Larina Earthly, MD;  Location: [facility] OR;  Service: Vascular;  Laterality: Left;   BASCILIC VEIN TRANSPOSITION Left 02/16/2019   Procedure: First and Second Stage Bascilic Vein Transposition Left Arm;  Surgeon: Maeola Harman, MD;  Location: [facility] OR;  Service: Vascular;  Laterality: Left;   EYE SURGERY Bilateral    FOOT SURGERY     INSERTION OF DIALYSIS CATHETER Right 11/22/2018   Procedure: INSERTION OF TUNNELED  DIALYSIS CATHETER, right internal jugular;  Surgeon: Larina Earthly, MD;  Location: [facility] OR;  Service: Vascular;  Laterality: Right;   IR DIALY SHUNT INTRO NEEDLE/INTRACATH INITIAL W/IMG LEFT Left 04/30/2021   IR FLUORO GUIDE CV LINE RIGHT  11/16/2018   IR REMOVAL TUN CV CATH W/O FL  04/07/2019   IR US GUIDE VASC ACCESS LEFT  05/07/2021   IR US  GUIDE VASC ACCESS RIGHT  11/16/2018   PR RPR COMPLEX RETINA DETACH VITRECT &MEMBRANE PEEL Left 10/01/2016   PR RPR COMPLEX RETINA DETACH VITRECT &MEMBRANE PEEL Right 12/03/2016   PR RPR COMPLEX RETINA DETACH VITRECT &MEMBRANE PEEL Right 04/01/2017   PR XCAPSL CTRC RMVL INSJ IO LENS PROSTH W/O ECP Right 12/03/2016    ROS: Review of Systems Negative except as stated above  PHYSICAL EXAM: There were no vitals taken for this visit.  Physical Exam  {male adult master:[id number]} {male adult master:[id number]}     Latest Ref Rng & Units 06/25/2021    9:02 AM 02/16/2019    9:21 AM 02/07/2019   10:07 AM  CMP  Glucose 70 - 99 mg/dL 161  92  096   BUN 6 - 24 mg/dL 22  42  50   Creatinine 0.76 - 1.27 mg/dL 0.45  4.09  8.11   Sodium 134 - 144  mmol/L 135  135  131   Potassium 3.5 - 5.2 mmol/L 4.1  5.1  5.4   Chloride 96 - 106 mmol/L 91  99  97   Calcium 8.7 - 10.2 mg/dL 8.9     Total Protein 6.0 - 8.5 g/dL 7.1     Total Bilirubin 0.0 - 1.2 mg/dL 0.5     Alkaline Phos 44 - 121 IU/L 99     AST 0 - 40 IU/L 12      Lipid Panel     Component Value Date/Time   CHOL 196 06/25/2021 0902   TRIG 186 (H) 06/25/2021 0902   HDL 45 06/25/2021 0902   CHOLHDL 4.4 06/25/2021 0902   CHOLHDL 6.5 11/16/2018 0147   VLDL 31 11/16/2018 0147   LDLCALC 118 (H) 06/25/2021 0902    CBC    Component Value Date/Time   WBC 7.2 06/25/2021 0902   WBC 5.8 11/24/2018 1250   RBC 3.52 (L) 06/25/2021 0902   RBC 2.63 (L) 11/24/2018 1250   HGB 10.6 (L) 06/25/2021 0902   HCT 31.5 (L) 06/25/2021 0902   PLT 314 06/25/2021 0902   MCV 90 06/25/2021 0902   MCH 30.1 06/25/2021 0902   MCH 30.4 11/24/2018 1250   MCHC 33.7 06/25/2021 0902   MCHC 34.3 11/24/2018 1250   RDW 12.8 06/25/2021 0902   LYMPHSABS 1.4 06/25/2021 0902   MONOABS 0.5 11/15/2018 2304   EOSABS 0.2 06/25/2021 0902   BASOSABS 0.1 06/25/2021 0902    ASSESSMENT AND PLAN:  There are no diagnoses linked to this encounter.   Patient was given the  opportunity to ask questions.  Patient verbalized understanding of the plan and was able to repeat key elements of the plan. Patient was given clear instructions to go to Emergency Department or return to medical center if symptoms don't improve, worsen, or new problems develop.The patient verbalized understanding.   No orders of the defined types were placed in this encounter.    Requested Prescriptions    No prescriptions requested or ordered in this encounter    No follow-ups on file.  Rema Fendt, NP

## 2022-06-03 ENCOUNTER — Other Ambulatory Visit: Payer: Self-pay | Admitting: *Deleted

## 2022-06-03 ENCOUNTER — Other Ambulatory Visit: Payer: Self-pay

## 2022-06-03 ENCOUNTER — Encounter: Payer: Self-pay | Admitting: Family

## 2022-06-03 DIAGNOSIS — I10 Essential (primary) hypertension: Secondary | ICD-10-CM

## 2022-06-03 DIAGNOSIS — E1122 Type 2 diabetes mellitus with diabetic chronic kidney disease: Secondary | ICD-10-CM

## 2022-06-03 MED ORDER — BASAGLAR KWIKPEN 100 UNIT/ML ~~LOC~~ SOPN
10.0000 [IU] | PEN_INJECTOR | Freq: Every day | SUBCUTANEOUS | 0 refills | Status: DC
  Filled 2022-06-03: qty 3, 30d supply, fill #0

## 2022-06-03 MED ORDER — AMLODIPINE BESYLATE 10 MG PO TABS
10.0000 mg | ORAL_TABLET | Freq: Every day | ORAL | 0 refills | Status: DC
  Filled 2022-06-03: qty 30, 30d supply, fill #0

## 2022-06-09 ENCOUNTER — Other Ambulatory Visit: Payer: Self-pay

## 2022-07-01 ENCOUNTER — Other Ambulatory Visit: Payer: Self-pay

## 2022-07-01 ENCOUNTER — Ambulatory Visit (INDEPENDENT_AMBULATORY_CARE_PROVIDER_SITE_OTHER): Payer: Self-pay | Admitting: Family Medicine

## 2022-07-01 VITALS — BP 202/97 | HR 68 | Temp 97.1°F | Resp 16

## 2022-07-01 DIAGNOSIS — E785 Hyperlipidemia, unspecified: Secondary | ICD-10-CM

## 2022-07-01 DIAGNOSIS — E1122 Type 2 diabetes mellitus with diabetic chronic kidney disease: Secondary | ICD-10-CM

## 2022-07-01 DIAGNOSIS — I10 Essential (primary) hypertension: Secondary | ICD-10-CM

## 2022-07-01 DIAGNOSIS — Z789 Other specified health status: Secondary | ICD-10-CM

## 2022-07-01 DIAGNOSIS — Z992 Dependence on renal dialysis: Secondary | ICD-10-CM

## 2022-07-01 DIAGNOSIS — Z7985 Long-term (current) use of injectable non-insulin antidiabetic drugs: Secondary | ICD-10-CM

## 2022-07-01 DIAGNOSIS — N186 End stage renal disease: Secondary | ICD-10-CM

## 2022-07-01 LAB — POCT GLYCOSYLATED HEMOGLOBIN (HGB A1C): Hemoglobin A1C: 7.2 % — AB (ref 4.0–5.6)

## 2022-07-01 MED ORDER — AMLODIPINE BESYLATE 10 MG PO TABS
10.0000 mg | ORAL_TABLET | Freq: Every day | ORAL | 1 refills | Status: DC
Start: 2022-07-01 — End: 2023-07-01
  Filled 2022-07-01: qty 90, 90d supply, fill #0
  Filled 2022-12-22: qty 90, 90d supply, fill #1

## 2022-07-01 MED ORDER — ATORVASTATIN CALCIUM 40 MG PO TABS
40.0000 mg | ORAL_TABLET | Freq: Every day | ORAL | 1 refills | Status: AC
Start: 2022-07-01 — End: ?
  Filled 2022-07-01: qty 90, 90d supply, fill #0
  Filled 2022-12-22: qty 90, 90d supply, fill #1

## 2022-07-01 MED ORDER — BASAGLAR KWIKPEN 100 UNIT/ML ~~LOC~~ SOPN
10.0000 [IU] | PEN_INJECTOR | Freq: Every day | SUBCUTANEOUS | 1 refills | Status: DC
Start: 2022-07-01 — End: 2022-09-10
  Filled 2022-07-01: qty 3, 30d supply, fill #0
  Filled 2022-08-07: qty 3, 30d supply, fill #1

## 2022-07-01 MED ORDER — HYDRALAZINE HCL 50 MG PO TABS
50.0000 mg | ORAL_TABLET | Freq: Three times a day (TID) | ORAL | 2 refills | Status: AC
Start: 2022-07-01 — End: ?
  Filled 2022-07-01: qty 270, 90d supply, fill #0
  Filled 2022-12-22: qty 90, 30d supply, fill #1

## 2022-07-01 NOTE — Progress Notes (Unsigned)
Established Patient Office Visit  Subjective    Patient ID: Max Sanchez, male    DOB: 11/22/1965  Age: 57 y.o. MRN: [id number]  CC: No chief complaint on file.   HPI Max Sanchez presents for routine follow up of chronic med issues. Patient reports that he has not been taking his insulin or BP meds for the past about 6 weeks. This visit was aided by an interpreter.    Outpatient Encounter Medications as of 07/01/2022  Medication Sig   AURYXIA 1 GM 210 MG(Fe) tablet Take by mouth.   diphenhydrAMINE (BENADRYL) 25 mg capsule Take by mouth.   Doxercalciferol (HECTOROL IV) Doxercalciferol (Hectorol)   Doxercalciferol (HECTOROL IV) Doxercalciferol (Hectorol)   Doxercalciferol (HECTOROL IV) Doxercalciferol (Hectorol)   iron sucrose in sodium chloride 0.9 % 100 mL Iron Sucrose (Venofer)   LOPERAMIDE HCL PO Take by mouth.   Methoxy PEG-Epoetin Beta (MIRCERA IJ) Mircera   Methoxy PEG-Epoetin Beta (MIRCERA IJ) Mircera   multivitamin (RENA-VIT) TABS tablet Take 1 tablet by mouth daily.   amLODipine (NORVASC) 10 MG tablet Take 1 tablet (10 mg total) by mouth at bedtime.   aspirin EC 81 MG tablet Take 81 mg by mouth daily.   atorvastatin (LIPITOR) 40 MG tablet Take 1 tablet (40 mg total) by mouth daily.   Blood Glucose Monitoring Suppl (TRUE METRIX METER) w/Device KIT Use to check blood sugars twice daily.   glucose blood (TRUE METRIX BLOOD GLUCOSE TEST) test strip Use to check blood sugars twice daily.   hydrALAZINE (APRESOLINE) 50 MG tablet Take 1 tablet (50 mg total) by mouth every 8 (eight) hours.   Insulin Glargine (BASAGLAR KWIKPEN) 100 UNIT/ML Inject 10 Units into the skin daily.   Insulin Pen Needle 32G X 4 MM MISC Use to inject insulin once daily.   nitroGLYCERIN (NITROSTAT) 0.4 MG SL tablet Place under the tongue.   TRUEplus Lancets 28G MISC Use to check blood sugars twice daily.   [DISCONTINUED] amLODipine (NORVASC) 10 MG tablet Take 1 tablet (10 mg total) by mouth at bedtime.    [DISCONTINUED] atorvastatin (LIPITOR) 40 MG tablet TAKE 1 TABLET BY MOUTH DAILY.   [DISCONTINUED] hydrALAZINE (APRESOLINE) 50 MG tablet TAKE 1 TABLET BY MOUTH EVERY 8 HOURS   [DISCONTINUED] Insulin Glargine (BASAGLAR KWIKPEN) 100 UNIT/ML Inject 10 Units into the skin daily.   No facility-administered encounter medications on file as of 07/01/2022.    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)    Stroke (HCC) 03/2018   Type 2 diabetes mellitus with complication, without long-term current use of insulin [facility])     Past Surgical History:  Procedure Laterality Date   A/V FISTULAGRAM Left 02/07/2019   Procedure: A/V FISTULAGRAM;  Surgeon: Maeola Harman, MD;  Location: [facility] INVASIVE CV LAB;  Service: Cardiovascular;  Laterality: Left;   AV FISTULA PLACEMENT Left 11/22/2018   Procedure: ARTERIOVENOUS (AV) FISTULA CREATION LEFT ARM;  Surgeon: Larina Earthly, MD;  Location: [facility] OR;  Service: Vascular;  Laterality: Left;   BASCILIC VEIN TRANSPOSITION Left 02/16/2019   Procedure: First and Second Stage Bascilic Vein Transposition Left Arm;  Surgeon: Maeola Harman, MD;  Location: [facility] OR;  Service: Vascular;  Laterality: Left;   EYE SURGERY Bilateral    FOOT SURGERY     INSERTION OF DIALYSIS CATHETER Right 11/22/2018   Procedure: INSERTION OF TUNNELED  DIALYSIS CATHETER, right internal jugular;  Surgeon: Larina Earthly, MD;  Location: [facility] OR;  Service: Vascular;  Laterality: Right;   IR DIALY SHUNT INTRO NEEDLE/INTRACATH INITIAL W/IMG LEFT Left 04/30/2021   IR FLUORO GUIDE CV LINE RIGHT  11/16/2018   IR REMOVAL TUN CV CATH W/O FL  04/07/2019   IR US GUIDE  VASC ACCESS LEFT  05/07/2021   IR US GUIDE VASC ACCESS RIGHT  11/16/2018   PR RPR COMPLEX RETINA DETACH VITRECT &MEMBRANE PEEL Left 10/01/2016   PR RPR COMPLEX RETINA DETACH VITRECT &MEMBRANE PEEL Right 12/03/2016   PR RPR COMPLEX RETINA DETACH VITRECT &MEMBRANE PEEL Right 04/01/2017   PR XCAPSL CTRC RMVL INSJ IO LENS PROSTH W/O ECP Right 12/03/2016    Family History  Problem Relation Age of Onset   Diabetes Father    Diabetes Brother    Kidney disease Brother     Social History   Socioeconomic History   Marital status: Married    Spouse name: Not on file   Number of children: Not on file   Years of education: Not on file   Highest education level: Not on file  Occupational History   Not on file  Tobacco Use   Smoking status: Former    Years: 7    Types: Cigarettes    Quit date: 2017    Years since quitting: 7.3   Smokeless tobacco: Never   Tobacco comments:    10 pack year history; quit 2 years ago  Vaping Use   Vaping Use: Never used  Substance and Sexual Activity   Alcohol use: Not Currently    Comment: stopped 2 years ago   Drug use: Not Currently    Comment: tried cocaine and marijuana when younger   Sexual activity: Not on file  Other Topics Concern   Not on file  Social History Narrative   Not on file   Social Determinants of Health   Financial Resource Strain: Not on file  Food Insecurity: Not on file  Transportation Needs: Not on file  Physical Activity: Not on file  Stress: Not on file  Social Connections: Not on file  Intimate Partner Violence: Not on file    Review of Systems  All other systems reviewed and are negative.       Objective    BP (!) 202/97   Pulse 68   Temp (!) 97.1 F (36.2 C) (Oral)   Resp 16   SpO2 97%   Physical Exam Vitals and nursing note reviewed.  Constitutional:      General: He is not in acute distress. Cardiovascular:     Rate and Rhythm: Normal rate and regular rhythm.  Pulmonary:     Effort: Pulmonary  effort is normal.     Breath sounds: Normal breath sounds.  Abdominal:     Palpations: Abdomen is soft.     Tenderness: There is no abdominal tenderness.  Skin:    Comments: Dialysis fistula noted in right upper arm  Neurological:     General: No focal deficit present.     Mental Status: He is alert and oriented to person, place, and time.         Assessment & Plan:   1. Type 2 diabetes mellitus with chronic kidney disease on chronic dialysis, without long-term  current use of insulin (HCC) Increasing A1c. Discussed  compliance. Meds refilled. monitor - POCT glycosylated hemoglobin (Hb A1C) - HM DIABETES FOOT EXAM - atorvastatin (LIPITOR) 40 MG tablet; Take 1 tablet (40 mg total) by mouth daily.  Dispense: 90 tablet; Refill: 1 - Insulin Glargine (BASAGLAR KWIKPEN) 100 UNIT/ML; Inject 10 Units into the skin daily.  Dispense: 3 mL; Refill: 1  2. Primary hypertension Elevated readings. Discussed compliance. Meds refilled.  - hydrALAZINE (APRESOLINE) 50 MG tablet; Take 1 tablet (50 mg total) by mouth every 8 (eight) hours.  Dispense: 270 tablet; Refill: 2 - amLODipine (NORVASC) 10 MG tablet; Take 1 tablet (10 mg total) by mouth at bedtime.  Dispense: 90 tablet; Refill: 1  3. Hyperlipidemia, unspecified hyperlipidemia type Continue. Meds refilled.   4. Language barrier to communication     Return in about 3 months (around 09/30/2022) for follow up.   Tommie Raymond, MD

## 2022-07-03 ENCOUNTER — Encounter: Payer: Self-pay | Admitting: Family Medicine

## 2022-08-07 ENCOUNTER — Other Ambulatory Visit: Payer: Self-pay

## 2022-09-10 ENCOUNTER — Other Ambulatory Visit: Payer: Self-pay

## 2022-09-10 ENCOUNTER — Other Ambulatory Visit: Payer: Self-pay | Admitting: Family Medicine

## 2022-09-10 DIAGNOSIS — E1122 Type 2 diabetes mellitus with diabetic chronic kidney disease: Secondary | ICD-10-CM

## 2022-09-10 MED ORDER — BASAGLAR KWIKPEN 100 UNIT/ML ~~LOC~~ SOPN
10.0000 [IU] | PEN_INJECTOR | Freq: Every day | SUBCUTANEOUS | 1 refills | Status: DC
Start: 2022-09-10 — End: 2022-09-30
  Filled 2022-09-10: qty 3, 30d supply, fill #0

## 2022-09-16 ENCOUNTER — Other Ambulatory Visit: Payer: Self-pay

## 2022-09-30 ENCOUNTER — Other Ambulatory Visit: Payer: Self-pay

## 2022-09-30 ENCOUNTER — Ambulatory Visit (INDEPENDENT_AMBULATORY_CARE_PROVIDER_SITE_OTHER): Payer: Self-pay | Admitting: Family Medicine

## 2022-09-30 VITALS — BP 160/93 | HR 70 | Temp 98.1°F | Resp 16 | Wt 167.0 lb

## 2022-09-30 DIAGNOSIS — I10 Essential (primary) hypertension: Secondary | ICD-10-CM

## 2022-09-30 DIAGNOSIS — N186 End stage renal disease: Secondary | ICD-10-CM

## 2022-09-30 DIAGNOSIS — E1122 Type 2 diabetes mellitus with diabetic chronic kidney disease: Secondary | ICD-10-CM

## 2022-09-30 DIAGNOSIS — E785 Hyperlipidemia, unspecified: Secondary | ICD-10-CM

## 2022-09-30 DIAGNOSIS — Z794 Long term (current) use of insulin: Secondary | ICD-10-CM

## 2022-09-30 DIAGNOSIS — Z992 Dependence on renal dialysis: Secondary | ICD-10-CM

## 2022-09-30 LAB — POCT GLYCOSYLATED HEMOGLOBIN (HGB A1C): Hemoglobin A1C: 7.1 % — AB (ref 4.0–5.6)

## 2022-09-30 MED ORDER — BASAGLAR KWIKPEN 100 UNIT/ML ~~LOC~~ SOPN
10.0000 [IU] | PEN_INJECTOR | Freq: Every day | SUBCUTANEOUS | 1 refills | Status: DC
Start: 2022-09-30 — End: 2023-09-10
  Filled 2022-09-30 – 2022-12-22 (×2): qty 3, 30d supply, fill #0

## 2022-09-30 NOTE — Progress Notes (Unsigned)
Patient is here for their 3 month follow-up Patient has no concerns today Care gaps have been discussed with patient  

## 2022-10-02 ENCOUNTER — Encounter: Payer: Self-pay | Admitting: Family Medicine

## 2022-10-02 NOTE — Progress Notes (Signed)
Established Patient Office Visit  Subjective    Patient ID: Max Sanchez, male    DOB: 07-16-1965  Age: 57 y.o. MRN: [id number]  CC:  Chief Complaint  Patient presents with   Follow-up   Diabetes   Hypertension    HPI Max Sanchez presents for routine follow up of rchronic med issues. Patient denies acute complaints or concerns. This visit was aided by an interpreter.    Outpatient Encounter Medications as of 09/30/2022  Medication Sig   amLODipine (NORVASC) 10 MG tablet Take 1 tablet (10 mg total) by mouth at bedtime.   aspirin EC 81 MG tablet Take 81 mg by mouth daily.   atorvastatin (LIPITOR) 40 MG tablet Take 1 tablet (40 mg total) by mouth daily.   AURYXIA 1 GM 210 MG(Fe) tablet Take by mouth.   Blood Glucose Monitoring Suppl (TRUE METRIX METER) w/Device KIT Use to check blood sugars twice daily.   diphenhydrAMINE (BENADRYL) 25 mg capsule Take by mouth.   Doxercalciferol (HECTOROL IV) Doxercalciferol (Hectorol)   Doxercalciferol (HECTOROL IV) Doxercalciferol (Hectorol)   Doxercalciferol (HECTOROL IV) Doxercalciferol (Hectorol)   glucose blood (TRUE METRIX BLOOD GLUCOSE TEST) test strip Use to check blood sugars twice daily.   hydrALAZINE (APRESOLINE) 50 MG tablet Take 1 tablet (50 mg total) by mouth every 8 (eight) hours.   Insulin Pen Needle 32G X 4 MM MISC Use to inject insulin once daily.   iron sucrose in sodium chloride 0.9 % 100 mL Iron Sucrose (Venofer)   LOPERAMIDE HCL PO Take by mouth.   Methoxy PEG-Epoetin Beta (MIRCERA IJ) Mircera   Methoxy PEG-Epoetin Beta (MIRCERA IJ) Mircera   multivitamin (RENA-VIT) TABS tablet Take 1 tablet by mouth daily.   nitroGLYCERIN (NITROSTAT) 0.4 MG SL tablet Place under the tongue.   TRUEplus Lancets 28G MISC Use to check blood sugars twice daily.   [DISCONTINUED] Insulin Glargine (BASAGLAR KWIKPEN) 100 UNIT/ML Inject 10 Units into the skin daily.   Insulin Glargine (BASAGLAR KWIKPEN) 100 UNIT/ML Inject 10 Units into the  skin daily.   No facility-administered encounter medications on file as of 09/30/2022.    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)    Stroke (HCC) 03/2018   Type 2 diabetes mellitus with complication, without long-term current use of insulin [facility])     Past Surgical History:  Procedure Laterality Date   A/V FISTULAGRAM Left 02/07/2019   Procedure: A/V FISTULAGRAM;  Surgeon: Maeola Harman, MD;  Location[facility] INVASIVE CV LAB;  Service: Cardiovascular;  Laterality: Left;   AV FISTULA PLACEMENT Left 11/22/2018   Procedure: ARTERIOVENOUS (AV) FISTULA CREATION LEFT ARM;  Surgeon: Larina Earthly, MD;  Location: [facility] OR;  Service: Vascular;  Laterality: Left;   BASCILIC VEIN TRANSPOSITION Left 02/16/2019   Procedure: First and Second Stage Bascilic Vein Transposition Left Arm;  Surgeon: Maeola Harman, MD;  Location: [facility] OR;  Service: Vascular;  Laterality: Left;   EYE SURGERY Bilateral    FOOT SURGERY     INSERTION OF DIALYSIS CATHETER Right 11/22/2018   Procedure: INSERTION OF TUNNELED  DIALYSIS CATHETER, right internal jugular;  Surgeon: Larina Earthly, MD;  Location: [facility] OR;  Service:  Vascular;  Laterality: Right;   IR DIALY SHUNT INTRO NEEDLE/INTRACATH INITIAL W/IMG LEFT Left 04/30/2021   IR FLUORO GUIDE CV LINE RIGHT  11/16/2018   IR REMOVAL TUN CV CATH W/O FL  04/07/2019   IR US GUIDE VASC ACCESS LEFT  05/07/2021   IR US GUIDE VASC ACCESS RIGHT  11/16/2018   PR RPR COMPLEX RETINA DETACH VITRECT &MEMBRANE PEEL Left 10/01/2016   PR RPR COMPLEX RETINA DETACH VITRECT &MEMBRANE PEEL Right 12/03/2016   PR RPR COMPLEX RETINA DETACH VITRECT &MEMBRANE PEEL  Right 04/01/2017   PR XCAPSL CTRC RMVL INSJ IO LENS PROSTH W/O ECP Right 12/03/2016    Family History  Problem Relation Age of Onset   Diabetes Father    Diabetes Brother    Kidney disease Brother     Social History   Socioeconomic History   Marital status: Married    Spouse name: Not on file   Number of children: Not on file   Years of education: Not on file   Highest education level: Not on file  Occupational History   Not on file  Tobacco Use   Smoking status: Former    Current packs/day: 0.00    Types: Cigarettes    Start date: 2010    Quit date: 2017    Years since quitting: 7.5   Smokeless tobacco: Never   Tobacco comments:    10 pack year history; quit 2 years ago  Vaping Use   Vaping status: Never Used  Substance and Sexual Activity   Alcohol use: Not Currently    Comment: stopped 2 years ago   Drug use: Not Currently    Comment: tried cocaine and marijuana when younger   Sexual activity: Not on file  Other Topics Concern   Not on file  Social History Narrative   Not on file   Social Determinants of Health   Financial Resource Strain: Not on file  Food Insecurity: Not on file  Transportation Needs: Not on file  Physical Activity: Not on file  Stress: Not on file  Social Connections: Not on file  Intimate Partner Violence: Not on file    Review of Systems  All other systems reviewed and are negative.       Objective    BP (!) 160/93   Pulse 70   Temp 98.1 F (36.7 C) (Oral)   Resp 16   Wt 167 lb (75.8 kg)   SpO2 96%   BMI 26.95 kg/m   Physical Exam Vitals and nursing note reviewed.  Constitutional:      General: He is not in acute distress. Cardiovascular:     Rate and Rhythm: Normal rate and regular rhythm.  Pulmonary:     Effort: Pulmonary effort is normal.     Breath sounds: Normal breath sounds.  Abdominal:     Palpations: Abdomen is soft.     Tenderness: There is no abdominal tenderness.  Skin:    Comments: Dialysis  fistula noted in right upper arm  Neurological:     General: No focal deficit present.     Mental Status: He is alert and oriented to person, place, and time.         Assessment & Plan:   1. Type 2 diabetes mellitus with chronic kidney disease on chronic dialysis, without long-term current use of insulin (HCC) Slight decrease A1c and now just at goal. Continue  - POCT glycosylated hemoglobin (Hb A1C) - Insulin Glargine (BASAGLAR KWIKPEN) 100 UNIT/ML; Inject 10 Units  into the skin daily.  Dispense: 3 mL; Refill: 1  2. ESRD (end stage renal disease) (HCC)   3. Essential hypertension Slightly elevated readings. Continue mgt with nephrology  4. Hyperlipidemia, unspecified hyperlipidemia type   Return in about 3 months (around 12/31/2022) for follow up.   Tommie Raymond, MD

## 2022-10-07 ENCOUNTER — Other Ambulatory Visit: Payer: Self-pay

## 2022-12-22 ENCOUNTER — Other Ambulatory Visit: Payer: Self-pay

## 2022-12-31 ENCOUNTER — Encounter: Payer: Self-pay | Admitting: Family Medicine

## 2022-12-31 ENCOUNTER — Ambulatory Visit (INDEPENDENT_AMBULATORY_CARE_PROVIDER_SITE_OTHER): Payer: Self-pay | Admitting: Family Medicine

## 2022-12-31 VITALS — BP 145/89 | HR 75 | Temp 97.6°F | Resp 16 | Wt 167.0 lb

## 2022-12-31 DIAGNOSIS — I10 Essential (primary) hypertension: Secondary | ICD-10-CM

## 2022-12-31 DIAGNOSIS — E785 Hyperlipidemia, unspecified: Secondary | ICD-10-CM

## 2022-12-31 DIAGNOSIS — Z7984 Long term (current) use of oral hypoglycemic drugs: Secondary | ICD-10-CM

## 2022-12-31 DIAGNOSIS — Z789 Other specified health status: Secondary | ICD-10-CM

## 2022-12-31 DIAGNOSIS — N186 End stage renal disease: Secondary | ICD-10-CM

## 2022-12-31 DIAGNOSIS — E1122 Type 2 diabetes mellitus with diabetic chronic kidney disease: Secondary | ICD-10-CM

## 2022-12-31 DIAGNOSIS — Z992 Dependence on renal dialysis: Secondary | ICD-10-CM

## 2022-12-31 LAB — POCT GLYCOSYLATED HEMOGLOBIN (HGB A1C): Hemoglobin A1C: 7.2 % — AB (ref 4.0–5.6)

## 2023-01-01 ENCOUNTER — Encounter: Payer: Self-pay | Admitting: Family Medicine

## 2023-01-02 ENCOUNTER — Encounter: Payer: Self-pay | Admitting: Family Medicine

## 2023-04-02 ENCOUNTER — Encounter: Payer: Self-pay | Admitting: Family Medicine

## 2023-04-02 ENCOUNTER — Ambulatory Visit: Payer: Self-pay | Admitting: Family Medicine

## 2023-04-02 VITALS — BP 214/105 | HR 77 | Temp 98.1°F | Resp 16 | Ht 65.0 in | Wt 167.2 lb

## 2023-04-02 DIAGNOSIS — Z992 Dependence on renal dialysis: Secondary | ICD-10-CM

## 2023-04-02 DIAGNOSIS — E1122 Type 2 diabetes mellitus with diabetic chronic kidney disease: Secondary | ICD-10-CM

## 2023-04-02 DIAGNOSIS — I10 Essential (primary) hypertension: Secondary | ICD-10-CM

## 2023-04-02 DIAGNOSIS — Z794 Long term (current) use of insulin: Secondary | ICD-10-CM

## 2023-04-02 DIAGNOSIS — E785 Hyperlipidemia, unspecified: Secondary | ICD-10-CM

## 2023-04-02 DIAGNOSIS — Z789 Other specified health status: Secondary | ICD-10-CM

## 2023-04-02 DIAGNOSIS — N186 End stage renal disease: Secondary | ICD-10-CM

## 2023-04-02 LAB — POCT GLYCOSYLATED HEMOGLOBIN (HGB A1C): Hemoglobin A1C: 6.9 % — AB (ref 4.0–5.6)

## 2023-04-06 ENCOUNTER — Encounter: Payer: Self-pay | Admitting: Family Medicine

## 2023-04-06 NOTE — Progress Notes (Signed)
Established Patient Office Visit  Subjective    Patient ID: Max Sanchez, male    DOB: Apr 11, 1965  Age: 58 y.o. MRN: [id number]  CC:  Chief Complaint  Patient presents with   Follow-up    3 month    HPI [name] presents for routine follow up of diabetes and hypertension. Patient denies acute complaints. This visit was aided by an interpreter.   Outpatient Encounter Medications as of 04/02/2023  Medication Sig   amLODipine (NORVASC) 10 MG tablet Take 1 tablet (10 mg total) by mouth at bedtime.   aspirin EC 81 MG tablet Take 81 mg by mouth daily.   atorvastatin (LIPITOR) 40 MG tablet Take 1 tablet (40 mg total) by mouth daily.   AURYXIA 1 GM 210 MG(Fe) tablet Take by mouth.   Blood Glucose Monitoring Suppl (TRUE METRIX METER) w/Device KIT Use to check blood sugars twice daily.   Doxercalciferol (HECTOROL IV) Doxercalciferol (Hectorol)   glucose blood (TRUE METRIX BLOOD GLUCOSE TEST) test strip Use to check blood sugars twice daily.   hydrALAZINE (APRESOLINE) 50 MG tablet Take 1 tablet (50 mg total) by mouth every 8 (eight) hours.   Insulin Glargine (BASAGLAR KWIKPEN) 100 UNIT/ML Inject 10 Units into the skin daily.   Insulin Pen Needle 32G X 4 MM MISC Use to inject insulin once daily.   iron sucrose in sodium chloride 0.9 % 100 mL Iron Sucrose (Venofer)   multivitamin (RENA-VIT) TABS tablet Take 1 tablet by mouth daily.   nitroGLYCERIN (NITROSTAT) 0.4 MG SL tablet Place under the tongue.   TRUEplus Lancets 28G MISC Use to check blood sugars twice daily.   diphenhydrAMINE (BENADRYL) 25 mg capsule Take by mouth.   No facility-administered encounter medications on file as of 04/02/2023.    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 (HCC)    Stroke (HCC) 03/2018   Type 2 diabetes mellitus with complication, without long-term current use of insulin [facility])     Past Surgical History:  Procedure Laterality Date   A/V FISTULAGRAM Left 02/07/2019   Procedure: A/V FISTULAGRAM;  Surgeon: Maeola Harman, MD;  Location: [facility] INVASIVE CV LAB;  Service: Cardiovascular;  Laterality: Left;   AV FISTULA PLACEMENT Left 11/22/2018   Procedure: ARTERIOVENOUS (AV) FISTULA CREATION LEFT ARM;  Surgeon: Larina Earthly, MD;  Location: [facility] OR;  Service: Vascular;  Laterality: Left;   BASCILIC VEIN TRANSPOSITION Left 02/16/2019   Procedure: First and Second Stage Bascilic Vein Transposition Left Arm;  Surgeon: Maeola Harman, MD;  Location: [facility] OR;  Service: Vascular;  Laterality: Left;   EYE SURGERY Bilateral    FOOT SURGERY     INSERTION OF DIALYSIS CATHETER Right 11/22/2018   Procedure: INSERTION OF TUNNELED  DIALYSIS CATHETER, right internal jugular;  Surgeon: Larina Earthly, MD;  Location: [facility] OR;  Service: Vascular;  Laterality: Right;   IR DIALY SHUNT INTRO NEEDLE/INTRACATH INITIAL W/IMG LEFT Left 04/30/2021   IR FLUORO GUIDE CV LINE RIGHT  11/16/2018   IR REMOVAL TUN CV CATH W/O FL  04/07/2019   IR US GUIDE VASC ACCESS LEFT  05/07/2021   IR US GUIDE VASC ACCESS RIGHT  11/16/2018  PR RPR COMPLEX RETINA DETACH VITRECT &MEMBRANE PEEL Left 10/01/2016   PR RPR COMPLEX RETINA DETACH VITRECT &MEMBRANE PEEL Right 12/03/2016   PR RPR COMPLEX RETINA DETACH VITRECT &MEMBRANE PEEL Right 04/01/2017   PR XCAPSL CTRC RMVL INSJ IO LENS PROSTH W/O ECP Right 12/03/2016    Family History  Problem Relation Age of Onset   Diabetes Father    Diabetes Brother    Kidney disease Brother     Social History   Socioeconomic History   Marital status: Married    Spouse name: Not on file   Number of children: Not on file   Years of education: Not on file    Highest education level: Not on file  Occupational History   Not on file  Tobacco Use   Smoking status: Former    Current packs/day: 0.00    Types: Cigarettes    Start date: 2010    Quit date: 2017    Years since quitting: 8.0   Smokeless tobacco: Never   Tobacco comments:    10 pack year history; quit 2 years ago  Vaping Use   Vaping status: Never Used  Substance and Sexual Activity   Alcohol use: Not Currently    Comment: stopped 2 years ago   Drug use: Not Currently    Comment: tried cocaine and marijuana when younger   Sexual activity: Not on file  Other Topics Concern   Not on file  Social History Narrative   Not on file   Social Drivers of Health   Financial Resource Strain: Low Risk  (04/02/2023)   Overall Financial Resource Strain (CARDIA)    Difficulty of Paying Living Expenses: Not very hard  Food Insecurity: No Food Insecurity (04/02/2023)   Hunger Vital Sign    Worried About Running Out of Food in the Last Year: Never true    Ran Out of Food in the Last Year: Never true  Transportation Needs: No Transportation Needs (04/02/2023)   PRAPARE - Administrator, Civil Service (Medical): No    Lack of Transportation (Non-Medical): No  Physical Activity: Insufficiently Active (04/02/2023)   Exercise Vital Sign    Days of Exercise per Week: 4 days    Minutes of Exercise per Session: 30 min  Stress: No Stress Concern Present (04/02/2023)   [employer] of Occupational Health - Occupational Stress Questionnaire    Feeling of Stress : Not at all  Social Connections: Moderately Integrated (04/02/2023)   Social Connection and Isolation Panel [NHANES]    Frequency of Communication with Friends and Family: Twice a week    Frequency of Social Gatherings with Friends and Family: Once a week    Attends Religious Services: 1 to 4 times per year    Active Member of [employer] or Organizations: No    Attends Banker Meetings: Never    Marital Status:  Married  Catering manager Violence: Not At Risk (04/02/2023)   Humiliation, Afraid, Rape, and Kick questionnaire    Fear of Current or Ex-Partner: No    Emotionally Abused: No    Physically Abused: No    Sexually Abused: No    Review of Systems  All other systems reviewed and are negative.       Objective    BP (!) 214/105 (BP Location: Right Arm, Patient Position: Sitting, Cuff Size: Normal) Comment: made MD aware  Pulse 77   Temp 98.1 F (36.7 C) (Oral)   Resp 16   Ht 5\' 5"  (  1.651 m)   Wt 167 lb 3.2 oz (75.8 kg)   SpO2 96%   BMI 27.82 kg/m   Physical Exam Vitals and nursing note reviewed.  Constitutional:      General: He is not in acute distress. Cardiovascular:     Rate and Rhythm: Normal rate and regular rhythm.  Pulmonary:     Effort: Pulmonary effort is normal.     Breath sounds: Normal breath sounds.  Abdominal:     Palpations: Abdomen is soft.     Tenderness: There is no abdominal tenderness.  Skin:    Comments: Dialysis fistula noted in right upper arm  Neurological:     General: No focal deficit present.     Mental Status: He is alert and oriented to person, place, and time.         Assessment & Plan:   1. Type 2 diabetes mellitus with chronic kidney disease on chronic dialysis, without long-term current use of insulin (HCC) (Primary) Improved A1c and now at goal. Continue  - HgB A1c  2. Uncontrolled hypertension Elevated readings. Referred for further eval/mgt - Ambulatory referral to Nephrology  3. Hyperlipidemia, unspecified hyperlipidemia type Continue   4. Language barrier to communication      Return for follow up, chronic med issues.   Tommie Raymond, MD

## 2023-07-01 ENCOUNTER — Other Ambulatory Visit: Payer: Self-pay

## 2023-07-01 ENCOUNTER — Encounter: Payer: Self-pay | Admitting: Family Medicine

## 2023-07-01 ENCOUNTER — Ambulatory Visit: Payer: Self-pay | Admitting: Family Medicine

## 2023-07-01 VITALS — BP 182/99 | HR 71 | Wt 159.6 lb

## 2023-07-01 DIAGNOSIS — Z794 Long term (current) use of insulin: Secondary | ICD-10-CM

## 2023-07-01 DIAGNOSIS — N186 End stage renal disease: Secondary | ICD-10-CM

## 2023-07-01 DIAGNOSIS — E1122 Type 2 diabetes mellitus with diabetic chronic kidney disease: Secondary | ICD-10-CM

## 2023-07-01 DIAGNOSIS — Z789 Other specified health status: Secondary | ICD-10-CM

## 2023-07-01 DIAGNOSIS — Z992 Dependence on renal dialysis: Secondary | ICD-10-CM

## 2023-07-01 DIAGNOSIS — I10 Essential (primary) hypertension: Secondary | ICD-10-CM

## 2023-07-01 LAB — POCT GLYCOSYLATED HEMOGLOBIN (HGB A1C): HbA1c, POC (controlled diabetic range): 6.6 % (ref 0.0–7.0)

## 2023-07-01 MED ORDER — LISINOPRIL 20 MG PO TABS
20.0000 mg | ORAL_TABLET | Freq: Every day | ORAL | 0 refills | Status: DC
  Filled 2023-07-01: qty 90, 90d supply, fill #0

## 2023-07-01 MED ORDER — AMLODIPINE BESYLATE 10 MG PO TABS
10.0000 mg | ORAL_TABLET | Freq: Every day | ORAL | 1 refills | Status: DC
  Filled 2023-07-01: qty 90, 90d supply, fill #0

## 2023-07-01 MED ORDER — HYDROCHLOROTHIAZIDE 25 MG PO TABS
25.0000 mg | ORAL_TABLET | Freq: Every day | ORAL | 0 refills | Status: DC
  Filled 2023-07-01: qty 90, 90d supply, fill #0

## 2023-07-01 NOTE — Progress Notes (Signed)
cm

## 2023-07-01 NOTE — Progress Notes (Signed)
 Established Patient Office Visit  Subjective    Patient ID: Max Sanchez, male    DOB: 1965-12-15  Age: 58 y.o. MRN: [id number]  CC:  Chief Complaint  Patient presents with   Medical Management of Chronic Issues    HPI Max Sanchez presents for routine follow up of chronic med issues including diabetes and hypertension. Patient reports med compliance and denies acute complaints. This visit was aided by an interpreter.   Outpatient Encounter Medications as of 07/01/2023  Medication Sig   aspirin  EC 81 MG tablet Take 81 mg by mouth daily.   atorvastatin  (LIPITOR) 40 MG tablet Take 1 tablet (40 mg total) by mouth daily.   AURYXIA 1 GM 210 MG(Fe) tablet Take by mouth.   Blood Glucose Monitoring Suppl (TRUE METRIX METER) w/Device KIT Use to check blood sugars twice daily.   Doxercalciferol (HECTOROL IV) Doxercalciferol (Hectorol)   glucose blood (TRUE METRIX BLOOD GLUCOSE TEST) test strip Use to check blood sugars twice daily.   hydrALAZINE  (APRESOLINE ) 50 MG tablet Take 1 tablet (50 mg total) by mouth every 8 (eight) hours.   hydrochlorothiazide  (HYDRODIURIL ) 25 MG tablet Take 1 tablet (25 mg total) by mouth daily.   Insulin  Glargine (BASAGLAR  KWIKPEN) 100 UNIT/ML Inject 10 Units into the skin daily.   Insulin  Pen Needle 32G X 4 MM MISC Use to inject insulin  once daily.   iron sucrose in sodium chloride  0.9 % 100 mL Iron Sucrose (Venofer)   lisinopril  (ZESTRIL ) 20 MG tablet Take 1 tablet (20 mg total) by mouth daily.   multivitamin (RENA-VIT) TABS tablet Take 1 tablet by mouth daily.   nitroGLYCERIN  (NITROSTAT ) 0.4 MG SL tablet Place under the tongue.   TRUEplus Lancets 28G MISC Use to check blood sugars twice daily.   [DISCONTINUED] amLODipine  (NORVASC ) 10 MG tablet Take 1 tablet (10 mg total) by mouth at bedtime.   amLODipine  (NORVASC ) 10 MG tablet Take 1 tablet (10 mg total) by mouth at bedtime.   diphenhydrAMINE (BENADRYL) 25 mg capsule Take by mouth.   No  facility-administered encounter medications on file as of 07/01/2023.    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)    Stroke (HCC) 03/2018   Type 2 diabetes mellitus with complication, without long-term current use of insulin  [facility])     Past Surgical History:  Procedure Laterality Date   A/V FISTULAGRAM Left 02/07/2019   Procedure: A/V FISTULAGRAM;  Surgeon: Adine Hoof, MD;  Location: [facility] INVASIVE CV LAB;  Service: Cardiovascular;  Laterality: Left;   AV FISTULA PLACEMENT Left 11/22/2018   Procedure: ARTERIOVENOUS (AV) FISTULA CREATION LEFT ARM;  Surgeon: Mayo Speck, MD;  Location: [facility] OR;  Service: Vascular;  Laterality: Left;   BASCILIC VEIN TRANSPOSITION Left 02/16/2019   Procedure: First and Second Stage Bascilic Vein Transposition Left Arm;  Surgeon: Adine Hoof, MD;  Location: [facility] OR;  Service: Vascular;  Laterality: Left;   EYE SURGERY Bilateral    FOOT SURGERY     INSERTION OF DIALYSIS CATHETER Right 11/22/2018   Procedure: INSERTION OF TUNNELED  DIALYSIS CATHETER, right internal jugular;  Surgeon: Mayo Speck, MD;  Location: [facility] OR;  Service: Vascular;  Laterality: Right;   IR DIALY SHUNT INTRO NEEDLE/INTRACATH INITIAL W/IMG LEFT Left 04/30/2021   IR FLUORO GUIDE CV LINE RIGHT  11/16/2018   IR REMOVAL TUN CV CATH W/O FL  04/07/2019   IR US  GUIDE VASC ACCESS LEFT  05/07/2021   IR US  GUIDE VASC ACCESS RIGHT  11/16/2018   PR RPR COMPLEX RETINA DETACH VITRECT &MEMBRANE PEEL Left 10/01/2016   PR RPR COMPLEX RETINA DETACH VITRECT &MEMBRANE PEEL Right 12/03/2016   PR RPR COMPLEX RETINA DETACH VITRECT &MEMBRANE PEEL Right 04/01/2017    PR XCAPSL CTRC RMVL INSJ IO LENS PROSTH W/O ECP Right 12/03/2016    Family History  Problem Relation Age of Onset   Diabetes Father    Diabetes Brother    Kidney disease Brother     Social History   Socioeconomic History   Marital status: Married    Spouse name: Not on file   Number of children: Not on file   Years of education: Not on file   Highest education level: Not on file  Occupational History   Not on file  Tobacco Use   Smoking status: Former    Current packs/day: 0.00    Types: Cigarettes    Start date: 2010    Quit date: 2017    Years since quitting: 8.3   Smokeless tobacco: Never   Tobacco comments:    10 pack year history; quit 2 years ago  Vaping Use   Vaping status: Never Used  Substance and Sexual Activity   Alcohol use: Not Currently    Comment: stopped 2 years ago   Drug use: Not Currently    Comment: tried cocaine and marijuana when younger   Sexual activity: Not on file  Other Topics Concern   Not on file  Social History Narrative   Not on file   Social Drivers of Health   Financial Resource Strain: Low Risk  (04/02/2023)   Overall Financial Resource Strain (CARDIA)    Difficulty of Paying Living Expenses: Not very hard  Food Insecurity: No Food Insecurity (04/02/2023)   Hunger Vital Sign    Worried About Running Out of Food in the Last Year: Never true    Ran Out of Food in the Last Year: Never true  Transportation Needs: No Transportation Needs (04/02/2023)   PRAPARE - Administrator, Civil Service (Medical): No    Lack of Transportation (Non-Medical): No  Physical Activity: Insufficiently Active (04/02/2023)   Exercise Vital Sign    Days of Exercise per Week: 4 days    Minutes of Exercise per Session: 30 min  Stress: No Stress Concern Present (04/02/2023)   [employer] of Occupational Health - Occupational Stress Questionnaire    Feeling of Stress : Not at all  Social Connections: Moderately Integrated (04/02/2023)    Social Connection and Isolation Panel [NHANES]    Frequency of Communication with Friends and Family: Twice a week    Frequency of Social Gatherings with Friends and Family: Once a week    Attends Religious Services: 1 to 4 times per year    Active Member of [employer] or Organizations: No    Attends Banker Meetings: Never    Marital Status: Married  Catering manager Violence: Not At Risk (04/02/2023)   Humiliation, Afraid, Rape, and Kick questionnaire    Fear of Current or Ex-Partner: No    Emotionally Abused: No    Physically Abused: No    Sexually Abused: No    Review  of Systems  All other systems reviewed and are negative.       Objective    BP (!) 182/99 (BP Location: Right Arm, Cuff Size: Normal)   Pulse 71   Wt 159 lb 9.6 oz (72.4 kg)   SpO2 97%   BMI 26.56 kg/m   Physical Exam Vitals and nursing note reviewed.  Constitutional:      General: He is not in acute distress. Cardiovascular:     Rate and Rhythm: Normal rate and regular rhythm.  Pulmonary:     Effort: Pulmonary effort is normal.     Breath sounds: Normal breath sounds.  Abdominal:     Palpations: Abdomen is soft.     Tenderness: There is no abdominal tenderness.  Skin:    Comments: Dialysis fistula noted in right upper arm  Neurological:     General: No focal deficit present.     Mental Status: He is alert and oriented to person, place, and time.         Assessment & Plan:   Type 2 diabetes mellitus with chronic kidney disease on chronic dialysis, without long-term current use of insulin  (HCC) -     POCT glycosylated hemoglobin (Hb A1C)  Uncontrolled hypertension -     amLODIPine  Besylate; Take 1 tablet (10 mg total) by mouth at bedtime.  Dispense: 90 tablet; Refill: 1  ESRD (end stage renal disease) (HCC)  Encounter for long-term (current) use of insulin  (HCC)  Language barrier to communication  Other orders -     Lisinopril ; Take 1 tablet (20 mg total) by mouth daily.   Dispense: 90 tablet; Refill: 0 -     hydroCHLOROthiazide ; Take 1 tablet (25 mg total) by mouth daily.  Dispense: 90 tablet; Refill: 0   Will add hydrochlorothiazide  to BP regimen  Return in about 2 weeks (around 07/15/2023) for follow up, chronic med issues.   Arlo Lama, MD

## 2023-07-02 ENCOUNTER — Other Ambulatory Visit: Payer: Self-pay

## 2023-07-15 ENCOUNTER — Encounter: Payer: Self-pay | Admitting: Family Medicine

## 2023-07-15 ENCOUNTER — Ambulatory Visit (INDEPENDENT_AMBULATORY_CARE_PROVIDER_SITE_OTHER): Payer: Self-pay | Admitting: Family Medicine

## 2023-07-15 ENCOUNTER — Other Ambulatory Visit: Payer: Self-pay

## 2023-07-15 VITALS — BP 203/107 | HR 77 | Wt 161.0 lb

## 2023-07-15 DIAGNOSIS — Z789 Other specified health status: Secondary | ICD-10-CM

## 2023-07-15 DIAGNOSIS — N186 End stage renal disease: Secondary | ICD-10-CM

## 2023-07-15 DIAGNOSIS — I10 Essential (primary) hypertension: Secondary | ICD-10-CM

## 2023-07-15 MED ORDER — LOSARTAN POTASSIUM 50 MG PO TABS
50.0000 mg | ORAL_TABLET | Freq: Every day | ORAL | 0 refills | Status: DC
  Filled 2023-07-15: qty 90, 90d supply, fill #0

## 2023-07-15 NOTE — Progress Notes (Signed)
 Established Patient Office Visit  Subjective    Patient ID: Max Sanchez, male    DOB: 03-20-1965  Age: 58 y.o. MRN: [id number]  CC:  Chief Complaint  Patient presents with   Medical Management of Chronic Issues   Medication Reaction    Vomiting to bp medication lisinopril  and hydrochlorothiazide     HPI [name] presents for follow up of hypertension. Patient did not take the additional meds prescribed as he developed nausea and vomiting with the other meds. This visit was aided by an interpreter.   Outpatient Encounter Medications as of 07/15/2023  Medication Sig   amLODipine  (NORVASC ) 10 MG tablet Take 1 tablet (10 mg total) by mouth at bedtime.   aspirin  EC 81 MG tablet Take 81 mg by mouth daily.   atorvastatin  (LIPITOR) 40 MG tablet Take 1 tablet (40 mg total) by mouth daily.   AURYXIA 1 GM 210 MG(Fe) tablet Take by mouth.   Blood Glucose Monitoring Suppl (TRUE METRIX METER) w/Device KIT Use to check blood sugars twice daily.   Doxercalciferol (HECTOROL IV) Doxercalciferol (Hectorol)   glucose blood (TRUE METRIX BLOOD GLUCOSE TEST) test strip Use to check blood sugars twice daily.   hydrALAZINE  (APRESOLINE ) 50 MG tablet Take 1 tablet (50 mg total) by mouth every 8 (eight) hours.   Insulin  Glargine (BASAGLAR  KWIKPEN) 100 UNIT/ML Inject 10 Units into the skin daily.   Insulin  Pen Needle 32G X 4 MM MISC Use to inject insulin  once daily.   iron sucrose in sodium chloride  0.9 % 100 mL Iron Sucrose (Venofer)   losartan (COZAAR) 50 MG tablet Take 1 tablet (50 mg total) by mouth daily.   multivitamin (RENA-VIT) TABS tablet Take 1 tablet by mouth daily.   nitroGLYCERIN  (NITROSTAT ) 0.4 MG SL tablet Place under the tongue.   TRUEplus Lancets 28G MISC Use to check blood sugars twice daily.   diphenhydrAMINE (BENADRYL) 25 mg capsule Take by mouth.   hydrochlorothiazide  (HYDRODIURIL ) 25 MG tablet Take 1 tablet (25 mg total) by mouth daily. (Patient not taking: Reported on  07/15/2023)   lisinopril  (ZESTRIL ) 20 MG tablet Take 1 tablet (20 mg total) by mouth daily. (Patient not taking: Reported on 07/15/2023)   No facility-administered encounter medications on file as of 07/15/2023.    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)    Stroke (HCC) 03/2018   Type 2 diabetes mellitus with complication, without long-term current use of insulin  [facility])     Past Surgical History:  Procedure Laterality Date   A/V FISTULAGRAM Left 02/07/2019   Procedure: A/V FISTULAGRAM;  Surgeon: Adine Hoof, MD;  Location: [facility] INVASIVE CV LAB;  Service: Cardiovascular;  Laterality: Left;   AV FISTULA PLACEMENT Left 11/22/2018   Procedure: ARTERIOVENOUS (AV) FISTULA CREATION LEFT ARM;  Surgeon: Mayo Speck, MD;  Location: [facility] OR;  Service: Vascular;  Laterality: Left;   BASCILIC VEIN TRANSPOSITION Left 02/16/2019   Procedure: First and Second Stage Bascilic Vein Transposition Left Arm;  Surgeon: Adine Hoof, MD;  Location: [facility] OR;  Service: Vascular;  Laterality: Left;   EYE SURGERY Bilateral    FOOT SURGERY     INSERTION OF DIALYSIS CATHETER  Right 11/22/2018   Procedure: INSERTION OF TUNNELED  DIALYSIS CATHETER, right internal jugular;  Surgeon: Mayo Speck, MD;  Location: [facility] OR;  Service: Vascular;  Laterality: Right;   IR DIALY SHUNT INTRO NEEDLE/INTRACATH INITIAL W/IMG LEFT Left 04/30/2021   IR FLUORO GUIDE CV LINE RIGHT  11/16/2018   IR REMOVAL TUN CV CATH W/O FL  04/07/2019   IR US  GUIDE VASC ACCESS LEFT  05/07/2021   IR US  GUIDE VASC ACCESS RIGHT  11/16/2018   PR RPR COMPLEX RETINA DETACH VITRECT &MEMBRANE PEEL Left 10/01/2016    PR RPR COMPLEX RETINA DETACH VITRECT &MEMBRANE PEEL Right 12/03/2016   PR RPR COMPLEX RETINA DETACH VITRECT &MEMBRANE PEEL Right 04/01/2017   PR XCAPSL CTRC RMVL INSJ IO LENS PROSTH W/O ECP Right 12/03/2016    Family History  Problem Relation Age of Onset   Diabetes Father    Diabetes Brother    Kidney disease Brother     Social History   Socioeconomic History   Marital status: Married    Spouse name: Not on file   Number of children: Not on file   Years of education: Not on file   Highest education level: Not on file  Occupational History   Not on file  Tobacco Use   Smoking status: Former    Current packs/day: 0.00    Types: Cigarettes    Start date: 2010    Quit date: 2017    Years since quitting: 8.3   Smokeless tobacco: Never   Tobacco comments:    10 pack year history; quit 2 years ago  Vaping Use   Vaping status: Never Used  Substance and Sexual Activity   Alcohol use: Not Currently    Comment: stopped 2 years ago   Drug use: Not Currently    Comment: tried cocaine and marijuana when younger   Sexual activity: Not on file  Other Topics Concern   Not on file  Social History Narrative   Not on file   Social Drivers of Health   Financial Resource Strain: Low Risk  (04/02/2023)   Overall Financial Resource Strain (CARDIA)    Difficulty of Paying Living Expenses: Not very hard  Food Insecurity: No Food Insecurity (04/02/2023)   Hunger Vital Sign    Worried About Running Out of Food in the Last Year: Never true    Ran Out of Food in the Last Year: Never true  Transportation Needs: No Transportation Needs (04/02/2023)   PRAPARE - Administrator, Civil Service (Medical): No    Lack of Transportation (Non-Medical): No  Physical Activity: Insufficiently Active (04/02/2023)   Exercise Vital Sign    Days of Exercise per Week: 4 days    Minutes of Exercise per Session: 30 min  Stress: No Stress Concern Present (04/02/2023)   [employer] of  Occupational Health - Occupational Stress Questionnaire    Feeling of Stress : Not at all  Social Connections: Moderately Integrated (04/02/2023)   Social Connection and Isolation Panel [NHANES]    Frequency of Communication with Friends and Family: Twice a week    Frequency of Social Gatherings with Friends and Family: Once a week    Attends Religious Services: 1 to 4 times per year    Active Member of [employer] or Organizations: No    Attends Banker Meetings: Never    Marital Status: Married  Catering manager Violence: Not At Risk (04/02/2023)   Humiliation, Afraid, Rape, and Kick questionnaire  Fear of Current or Ex-Partner: No    Emotionally Abused: No    Physically Abused: No    Sexually Abused: No    Review of Systems  All other systems reviewed and are negative.       Objective    BP (!) 203/107 (BP Location: Right Arm, Patient Position: Sitting, Cuff Size: Normal)   Pulse 77   Wt 161 lb (73 kg)   SpO2 98%   BMI 26.79 kg/m   Physical Exam Vitals and nursing note reviewed.  Constitutional:      General: He is not in acute distress. Cardiovascular:     Rate and Rhythm: Normal rate and regular rhythm.  Pulmonary:     Effort: Pulmonary effort is normal.     Breath sounds: Normal breath sounds.  Abdominal:     Palpations: Abdomen is soft.     Tenderness: There is no abdominal tenderness.  Skin:    Comments: Dialysis fistula noted in right upper arm  Neurological:     General: No focal deficit present.     Mental Status: He is alert and oriented to person, place, and time.         Assessment & Plan:   Uncontrolled hypertension  ESRD (end stage renal disease) (HCC)  Language barrier to communication  Other orders -     Losartan Potassium; Take 1 tablet (50 mg total) by mouth daily.  Dispense: 90 tablet; Refill: 0     Return in about 4 weeks (around 08/12/2023) for follow up, chronic med issues.   Arlo Lama, MD

## 2023-07-24 ENCOUNTER — Other Ambulatory Visit: Payer: Self-pay

## 2023-08-18 ENCOUNTER — Encounter: Payer: Self-pay | Admitting: Family Medicine

## 2023-08-19 ENCOUNTER — Ambulatory Visit: Payer: Self-pay | Admitting: Family Medicine

## 2023-08-19 ENCOUNTER — Telehealth: Payer: Self-pay | Admitting: Family Medicine

## 2023-08-19 NOTE — Telephone Encounter (Signed)
 Pt came in for his appt more than 15 mins late and after he was no-showed for his scheduled appt. Was able to change pt contact to the right phone number. Pt was not aware that we were trying to reach out for his missed appt due to having multiple wrong/non-working phone numbers. Pt was rescheduled within 30 days.

## 2023-08-19 NOTE — Telephone Encounter (Signed)
 Called pt in all contact numbers and could not reach or leave vm to reschedule missed appt

## 2023-09-10 ENCOUNTER — Other Ambulatory Visit: Payer: Self-pay

## 2023-09-10 ENCOUNTER — Ambulatory Visit: Payer: Self-pay | Admitting: Family Medicine

## 2023-09-10 ENCOUNTER — Encounter: Payer: Self-pay | Admitting: Family Medicine

## 2023-09-10 VITALS — BP 189/88 | HR 71 | Ht 65.0 in | Wt 161.0 lb

## 2023-09-10 DIAGNOSIS — Z794 Long term (current) use of insulin: Secondary | ICD-10-CM

## 2023-09-10 DIAGNOSIS — I10 Essential (primary) hypertension: Secondary | ICD-10-CM

## 2023-09-10 DIAGNOSIS — N186 End stage renal disease: Secondary | ICD-10-CM

## 2023-09-10 DIAGNOSIS — Z992 Dependence on renal dialysis: Secondary | ICD-10-CM

## 2023-09-10 DIAGNOSIS — Z789 Other specified health status: Secondary | ICD-10-CM

## 2023-09-10 DIAGNOSIS — E785 Hyperlipidemia, unspecified: Secondary | ICD-10-CM

## 2023-09-10 DIAGNOSIS — E1122 Type 2 diabetes mellitus with diabetic chronic kidney disease: Secondary | ICD-10-CM

## 2023-09-10 DIAGNOSIS — Z603 Acculturation difficulty: Secondary | ICD-10-CM

## 2023-09-10 LAB — POCT GLYCOSYLATED HEMOGLOBIN (HGB A1C): HbA1c, POC (controlled diabetic range): 7.2 % — AB (ref 0.0–7.0)

## 2023-09-10 MED ORDER — HYDROCHLOROTHIAZIDE 25 MG PO TABS
25.0000 mg | ORAL_TABLET | Freq: Every day | ORAL | 1 refills | Status: AC
  Filled 2023-09-10 – 2023-09-22 (×2): qty 90, 90d supply, fill #0

## 2023-09-10 MED ORDER — BASAGLAR KWIKPEN 100 UNIT/ML ~~LOC~~ SOPN
10.0000 [IU] | PEN_INJECTOR | Freq: Every day | SUBCUTANEOUS | 1 refills | Status: AC
  Filled 2023-09-10: qty 3, 30d supply, fill #0

## 2023-09-10 MED ORDER — AMLODIPINE BESYLATE 10 MG PO TABS
10.0000 mg | ORAL_TABLET | Freq: Every day | ORAL | 1 refills | Status: AC
  Filled 2023-09-10 – 2023-09-22 (×2): qty 90, 90d supply, fill #0
  Filled 2023-12-21: qty 30, 30d supply, fill #1

## 2023-09-10 MED ORDER — LOSARTAN POTASSIUM 50 MG PO TABS
50.0000 mg | ORAL_TABLET | Freq: Every day | ORAL | 1 refills | Status: AC
Start: 2023-09-10 — End: ?
  Filled 2023-09-10 – 2023-09-22 (×2): qty 90, 90d supply, fill #0
  Filled 2023-12-21: qty 90, 90d supply, fill #1

## 2023-09-10 NOTE — Progress Notes (Signed)
 Established Patient Office Visit  Subjective    Patient ID: Max Sanchez, male    DOB: Jan 29, 1966  Age: 58 y.o. MRN: [id number]  CC:  Chief Complaint  Patient presents with   Medical Management of Chronic Issues    No concerns. Needs refill on amlodipine      HPI Max Sanchez presents for routine follow up of chronic med issues. Patient reports that she he has only been taking the amlodipine . He has not gotten the rest of his meds from the pharmacy. Patient denies acute complaints. This visit was aided by an interpreter.   Outpatient Encounter Medications as of 09/10/2023  Medication Sig   aspirin  EC 81 MG tablet Take 81 mg by mouth daily.   atorvastatin  (LIPITOR) 40 MG tablet Take 1 tablet (40 mg total) by mouth daily.   AURYXIA 1 GM 210 MG(Fe) tablet Take by mouth.   Blood Glucose Monitoring Suppl (TRUE METRIX METER) w/Device KIT Use to check blood sugars twice daily.   Doxercalciferol (HECTOROL IV) Doxercalciferol (Hectorol)   glucose blood (TRUE METRIX BLOOD GLUCOSE TEST) test strip Use to check blood sugars twice daily.   hydrALAZINE  (APRESOLINE ) 50 MG tablet Take 1 tablet (50 mg total) by mouth every 8 (eight) hours.   Insulin  Pen Needle 32G X 4 MM MISC Use to inject insulin  once daily.   iron sucrose in sodium chloride  0.9 % 100 mL Iron Sucrose (Venofer)   lisinopril  (ZESTRIL ) 20 MG tablet Take 1 tablet (20 mg total) by mouth daily.   multivitamin (RENA-VIT) TABS tablet Take 1 tablet by mouth daily.   nitroGLYCERIN  (NITROSTAT ) 0.4 MG SL tablet Place under the tongue.   TRUEplus Lancets 28G MISC Use to check blood sugars twice daily.   [DISCONTINUED] amLODipine  (NORVASC ) 10 MG tablet Take 1 tablet (10 mg total) by mouth at bedtime.   [DISCONTINUED] hydrochlorothiazide  (HYDRODIURIL ) 25 MG tablet Take 1 tablet (25 mg total) by mouth daily.   [DISCONTINUED] Insulin  Glargine (BASAGLAR  KWIKPEN) 100 UNIT/ML Inject 10 Units into the skin daily.   [DISCONTINUED] losartan   (COZAAR ) 50 MG tablet Take 1 tablet (50 mg total) by mouth daily.   amLODipine  (NORVASC ) 10 MG tablet Take 1 tablet (10 mg total) by mouth at bedtime.   diphenhydrAMINE (BENADRYL) 25 mg capsule Take by mouth.   hydrochlorothiazide  (HYDRODIURIL ) 25 MG tablet Take 1 tablet (25 mg total) by mouth daily.   Insulin  Glargine (BASAGLAR  KWIKPEN) 100 UNIT/ML Inject 10 Units into the skin daily.   losartan  (COZAAR ) 50 MG tablet Take 1 tablet (50 mg total) by mouth daily.   No facility-administered encounter medications on file as of 09/10/2023.    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)    Stroke (HCC) 03/2018   Type 2 diabetes mellitus with complication, without long-term current use of insulin  [facility])     Past Surgical History:  Procedure Laterality Date   A/V FISTULAGRAM Left 02/07/2019   Procedure: A/V FISTULAGRAM;  Surgeon: Sheree Penne Bruckner, MD;  Location: [facility] INVASIVE CV LAB;  Service: Cardiovascular;  Laterality: Left;   AV FISTULA PLACEMENT Left 11/22/2018   Procedure: ARTERIOVENOUS (AV) FISTULA CREATION LEFT ARM;  Surgeon: Oris Krystal FALCON,  MD;  Location: [facility] OR;  Service: Vascular;  Laterality: Left;   BASCILIC VEIN TRANSPOSITION Left 02/16/2019   Procedure: First and Second Stage Bascilic Vein Transposition Left Arm;  Surgeon: Sheree Penne Bruckner, MD;  Location: [facility] OR;  Service: Vascular;  Laterality: Left;   EYE SURGERY Bilateral    FOOT SURGERY     INSERTION OF DIALYSIS CATHETER Right 11/22/2018   Procedure: INSERTION OF TUNNELED  DIALYSIS CATHETER, right internal jugular;  Surgeon: Oris Krystal FALCON, MD;  Location: [facility] OR;  Service: Vascular;   Laterality: Right;   IR DIALY SHUNT INTRO NEEDLE/INTRACATH INITIAL W/IMG LEFT Left 04/30/2021   IR FLUORO GUIDE CV LINE RIGHT  11/16/2018   IR REMOVAL TUN CV CATH W/O FL  04/07/2019   IR US  GUIDE VASC ACCESS LEFT  05/07/2021   IR US  GUIDE VASC ACCESS RIGHT  11/16/2018   PR RPR COMPLEX RETINA DETACH VITRECT &MEMBRANE PEEL Left 10/01/2016   PR RPR COMPLEX RETINA DETACH VITRECT &MEMBRANE PEEL Right 12/03/2016   PR RPR COMPLEX RETINA DETACH VITRECT &MEMBRANE PEEL Right 04/01/2017   PR XCAPSL CTRC RMVL INSJ IO LENS PROSTH W/O ECP Right 12/03/2016    Family History  Problem Relation Age of Onset   Diabetes Father    Diabetes Brother    Kidney disease Brother     Social History   Socioeconomic History   Marital status: Married    Spouse name: Not on file   Number of children: Not on file   Years of education: Not on file   Highest education level: Not on file  Occupational History   Not on file  Tobacco Use   Smoking status: Former    Current packs/day: 0.00    Types: Cigarettes    Start date: 2010    Quit date: 2017    Years since quitting: 8.5   Smokeless tobacco: Never   Tobacco comments:    10 pack year history; quit 2 years ago  Vaping Use   Vaping status: Never Used  Substance and Sexual Activity   Alcohol use: Not Currently    Comment: stopped 2 years ago   Drug use: Not Currently    Comment: tried cocaine and marijuana when younger   Sexual activity: Not on file  Other Topics Concern   Not on file  Social History Narrative   Not on file   Social Drivers of Health   Financial Resource Strain: Low Risk  (04/02/2023)   Overall Financial Resource Strain (CARDIA)    Difficulty of Paying Living Expenses: Not very hard  Food Insecurity: No Food Insecurity (04/02/2023)   Hunger Vital Sign    Worried About Running Out of Food in the Last Year: Never true    Ran Out of Food in the Last Year: Never true  Transportation Needs: No Transportation Needs (04/02/2023)   PRAPARE -  Administrator, Civil Service (Medical): No    Lack of Transportation (Non-Medical): No  Physical Activity: Insufficiently Active (04/02/2023)   Exercise Vital Sign    Days of Exercise per Week: 4 days    Minutes of Exercise per Session: 30 min  Stress: No Stress Concern Present (04/02/2023)   [employer] of Occupational Health - Occupational Stress Questionnaire    Feeling of Stress : Not at all  Social Connections: Moderately Integrated (04/02/2023)   Social Connection and Isolation Panel    Frequency of Communication with Friends and Family: Twice a week    Frequency of Social Gatherings  with Friends and Family: Once a week    Attends Religious Services: 1 to 4 times per year    Active Member of [employer] or Organizations: No    Attends Banker Meetings: Never    Marital Status: Married  Catering manager Violence: Not At Risk (04/02/2023)   Humiliation, Afraid, Rape, and Kick questionnaire    Fear of Current or Ex-Partner: No    Emotionally Abused: No    Physically Abused: No    Sexually Abused: No    Review of Systems  All other systems reviewed and are negative.       Objective    BP (!) 189/88   Pulse 71   Ht 5' 5 (1.651 m)   Wt 161 lb (73 kg)   SpO2 98%   BMI 26.79 kg/m   Physical Exam Vitals and nursing note reviewed.  Constitutional:      General: He is not in acute distress. Cardiovascular:     Rate and Rhythm: Normal rate and regular rhythm.  Pulmonary:     Effort: Pulmonary effort is normal.     Breath sounds: Normal breath sounds.  Abdominal:     Palpations: Abdomen is soft.     Tenderness: There is no abdominal tenderness.  Skin:    Comments: Dialysis fistula noted in right upper arm  Neurological:     General: No focal deficit present.     Mental Status: He is alert and oriented to person, place, and time.         Assessment & Plan:   Type 2 diabetes mellitus with chronic kidney disease on chronic dialysis,  without long-term current use of insulin  (HCC) -     POCT glycosylated hemoglobin (Hb A1C) -     Basaglar  KwikPen; Inject 10 Units into the skin daily.  Dispense: 3 mL; Refill: 1  Uncontrolled hypertension -     amLODIPine  Besylate; Take 1 tablet (10 mg total) by mouth at bedtime.  Dispense: 90 tablet; Refill: 1  Hyperlipidemia, unspecified hyperlipidemia type  Language barrier to communication  Other orders -     hydroCHLOROthiazide ; Take 1 tablet (25 mg total) by mouth daily.  Dispense: 90 tablet; Refill: 1 -     Losartan  Potassium; Take 1 tablet (50 mg total) by mouth daily.  Dispense: 90 tablet; Refill: 1     Return in about 3 months (around 12/11/2023) for follow up, chronic med issues.   Tanda Raguel SQUIBB, MD

## 2023-09-21 ENCOUNTER — Other Ambulatory Visit: Payer: Self-pay

## 2023-09-22 ENCOUNTER — Other Ambulatory Visit: Payer: Self-pay

## 2023-12-14 ENCOUNTER — Ambulatory Visit: Payer: Self-pay | Admitting: Family Medicine

## 2023-12-14 ENCOUNTER — Encounter: Payer: Self-pay | Admitting: Family Medicine

## 2023-12-14 VITALS — BP 163/83 | HR 76 | Ht 65.0 in | Wt 159.2 lb

## 2023-12-14 DIAGNOSIS — I10 Essential (primary) hypertension: Secondary | ICD-10-CM

## 2023-12-14 DIAGNOSIS — E785 Hyperlipidemia, unspecified: Secondary | ICD-10-CM

## 2023-12-14 DIAGNOSIS — I120 Hypertensive chronic kidney disease with stage 5 chronic kidney disease or end stage renal disease: Secondary | ICD-10-CM

## 2023-12-14 DIAGNOSIS — Z603 Acculturation difficulty: Secondary | ICD-10-CM

## 2023-12-14 DIAGNOSIS — Z789 Other specified health status: Secondary | ICD-10-CM

## 2023-12-14 DIAGNOSIS — N186 End stage renal disease: Secondary | ICD-10-CM

## 2023-12-14 DIAGNOSIS — Z992 Dependence on renal dialysis: Secondary | ICD-10-CM

## 2023-12-14 DIAGNOSIS — E1122 Type 2 diabetes mellitus with diabetic chronic kidney disease: Secondary | ICD-10-CM

## 2023-12-14 LAB — POCT GLYCOSYLATED HEMOGLOBIN (HGB A1C): HbA1c, POC (controlled diabetic range): 6.8 % (ref 0.0–7.0)

## 2023-12-14 NOTE — Progress Notes (Unsigned)
 Established Patient Office Visit  Subjective    Patient ID: Max Sanchez, male    DOB: Jan 13, 1966  Age: 58 y.o. MRN: [id number]  CC:  Chief Complaint  Patient presents with   Medical Management of Chronic Issues    3 month follow up. A1C check. Pt reports sometimes taking BP medications, but sometimes does not because they make him dizzy when he takes all 3 at the same time  Needs referral for diabetic eye exam     HPI Max Sanchez presents for follow up of diabetes and hypertension.  Patient reports compliance with diabetes meds but not hypertension  meds as taking all three makes him dizzy. This visit was aided by an interpreter.   Outpatient Encounter Medications as of 12/14/2023  Medication Sig   amLODipine  (NORVASC ) 10 MG tablet Take 1 tablet (10 mg total) by mouth at bedtime.   hydrochlorothiazide  (HYDRODIURIL ) 25 MG tablet Take 1 tablet (25 mg total) by mouth daily.   losartan  (COZAAR ) 50 MG tablet Take 1 tablet (50 mg total) by mouth daily.   aspirin  EC 81 MG tablet Take 81 mg by mouth daily. (Patient not taking: Reported on 12/14/2023)   atorvastatin  (LIPITOR) 40 MG tablet Take 1 tablet (40 mg total) by mouth daily.   AURYXIA 1 GM 210 MG(Fe) tablet Take by mouth.   Blood Glucose Monitoring Suppl (TRUE METRIX METER) w/Device KIT Use to check blood sugars twice daily.   diphenhydrAMINE (BENADRYL) 25 mg capsule Take by mouth.   glucose blood (TRUE METRIX BLOOD GLUCOSE TEST) test strip Use to check blood sugars twice daily.   hydrALAZINE  (APRESOLINE ) 50 MG tablet Take 1 tablet (50 mg total) by mouth every 8 (eight) hours.   Insulin  Glargine (BASAGLAR  KWIKPEN) 100 UNIT/ML Inject 10 Units into the skin daily. (Patient not taking: Reported on 12/14/2023)   Insulin  Pen Needle 32G X 4 MM MISC Use to inject insulin  once daily. (Patient not taking: Reported on 12/14/2023)   multivitamin (RENA-VIT) TABS tablet Take 1 tablet by mouth daily.   nitroGLYCERIN  (NITROSTAT ) 0.4 MG SL  tablet Place under the tongue.   TRUEplus Lancets 28G MISC Use to check blood sugars twice daily.   [DISCONTINUED] lisinopril  (ZESTRIL ) 20 MG tablet Take 1 tablet (20 mg total) by mouth daily.   No facility-administered encounter medications on file as of 12/14/2023.    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)    Stroke (HCC) 03/2018   Type 2 diabetes mellitus with complication, without long-term current use of insulin  [facility])     Past Surgical History:  Procedure Laterality Date   A/V FISTULAGRAM Left 02/07/2019   Procedure: A/V FISTULAGRAM;  Surgeon: Sheree Penne Bruckner, MD;  Location[facility] INVASIVE CV LAB;  Service: Cardiovascular;  Laterality: Left;   AV FISTULA PLACEMENT Left 11/22/2018   Procedure: ARTERIOVENOUS (AV) FISTULA CREATION LEFT ARM;  Surgeon: Oris Krystal FALCON, MD;  Location: [facility] OR;  Service: Vascular;  Laterality: Left;   BASCILIC VEIN TRANSPOSITION Left 02/16/2019   Procedure: First and Second Stage Bascilic Vein Transposition Left Arm;  Surgeon: Sheree Penne Bruckner, MD;  Location: [facility] OR;  Service: Vascular;  Laterality: Left;  EYE SURGERY Bilateral    FOOT SURGERY     INSERTION OF DIALYSIS CATHETER Right 11/22/2018   Procedure: INSERTION OF TUNNELED  DIALYSIS CATHETER, right internal jugular;  Surgeon: Oris Krystal FALCON, MD;  Location: [facility] OR;  Service: Vascular;  Laterality: Right;   IR DIALY SHUNT INTRO NEEDLE/INTRACATH INITIAL W/IMG LEFT Left 04/30/2021   IR FLUORO GUIDE CV LINE RIGHT  11/16/2018   IR REMOVAL TUN CV CATH W/O FL  04/07/2019   IR US  GUIDE VASC ACCESS LEFT  05/07/2021   IR US  GUIDE VASC ACCESS RIGHT  11/16/2018   PR RPR  COMPLEX RETINA DETACH VITRECT &MEMBRANE PEEL Left 10/01/2016   PR RPR COMPLEX RETINA DETACH VITRECT &MEMBRANE PEEL Right 12/03/2016   PR RPR COMPLEX RETINA DETACH VITRECT &MEMBRANE PEEL Right 04/01/2017   PR XCAPSL CTRC RMVL INSJ IO LENS PROSTH W/O ECP Right 12/03/2016    Family History  Problem Relation Age of Onset   Diabetes Father    Diabetes Brother    Kidney disease Brother     Social History   Socioeconomic History   Marital status: Married    Spouse name: Not on file   Number of children: Not on file   Years of education: Not on file   Highest education level: Not on file  Occupational History   Not on file  Tobacco Use   Smoking status: Former    Current packs/day: 0.00    Types: Cigarettes    Start date: 2010    Quit date: 2017    Years since quitting: 8.7   Smokeless tobacco: Never   Tobacco comments:    10 pack year history; quit 2 years ago  Vaping Use   Vaping status: Never Used  Substance and Sexual Activity   Alcohol use: Not Currently    Comment: stopped 2 years ago   Drug use: Not Currently    Comment: tried cocaine and marijuana when younger   Sexual activity: Not on file  Other Topics Concern   Not on file  Social History Narrative   Not on file   Social Drivers of Health   Financial Resource Strain: Low Risk  (04/02/2023)   Overall Financial Resource Strain (CARDIA)    Difficulty of Paying Living Expenses: Not very hard  Food Insecurity: No Food Insecurity (04/02/2023)   Hunger Vital Sign    Worried About Running Out of Food in the Last Year: Never true    Ran Out of Food in the Last Year: Never true  Transportation Needs: No Transportation Needs (04/02/2023)   PRAPARE - Administrator, Civil Service (Medical): No    Lack of Transportation (Non-Medical): No  Physical Activity: Insufficiently Active (04/02/2023)   Exercise Vital Sign    Days of Exercise per Week: 4 days    Minutes of Exercise per Session: 30 min  Stress: No  Stress Concern Present (04/02/2023)   [employer] of Occupational Health - Occupational Stress Questionnaire    Feeling of Stress : Not at all  Social Connections: Moderately Integrated (04/02/2023)   Social Connection and Isolation Panel    Frequency of Communication with Friends and Family: Twice a week    Frequency of Social Gatherings with Friends and Family: Once a week    Attends Religious Services: 1 to 4 times per year    Active Member of [employer] or Organizations: No    Attends Banker Meetings: Never    Marital Status: Married  Catering manager Violence:  Not At Risk (04/02/2023)   Humiliation, Afraid, Rape, and Kick questionnaire    Fear of Current or Ex-Partner: No    Emotionally Abused: No    Physically Abused: No    Sexually Abused: No    Review of Systems  All other systems reviewed and are negative.       Objective    BP (!) 170/79   Pulse 76   Ht 5' 5 (1.651 m)   Wt 159 lb 3.2 oz (72.2 kg)   SpO2 96%   BMI 26.49 kg/m   Physical Exam Vitals and nursing note reviewed.  Constitutional:      General: He is not in acute distress. Cardiovascular:     Rate and Rhythm: Normal rate and regular rhythm.  Pulmonary:     Effort: Pulmonary effort is normal.     Breath sounds: Normal breath sounds.  Abdominal:     Palpations: Abdomen is soft.     Tenderness: There is no abdominal tenderness.  Skin:    Comments: Dialysis fistula noted in right upper arm  Neurological:     General: No focal deficit present.     Mental Status: He is alert and oriented to person, place, and time.     {Labs (Optional):23779}    Assessment & Plan:   Type 2 diabetes mellitus with chronic kidney disease on chronic dialysis, without long-term current use of insulin  (HCC)  Uncontrolled hypertension  Hyperlipidemia, unspecified hyperlipidemia type  Language barrier to communication  ESRD (end stage renal disease) (HCC)     No follow-ups on file.   Tanda Raguel SQUIBB, MD

## 2023-12-21 ENCOUNTER — Other Ambulatory Visit: Payer: Self-pay

## 2024-01-04 ENCOUNTER — Ambulatory Visit: Payer: Self-pay | Admitting: Family Medicine

## 2024-02-10 ENCOUNTER — Encounter: Payer: Self-pay | Admitting: Family Medicine

## 2024-02-23 ENCOUNTER — Ambulatory Visit: Payer: Self-pay | Admitting: Family Medicine
